# Patient Record
Sex: Female | Born: 1955
Health system: Southern US, Community
[De-identification: ages and names within clinical notes are randomized; demographics above are authoritative.]

## PROBLEM LIST (undated history)

## (undated) DIAGNOSIS — K219 Gastro-esophageal reflux disease without esophagitis: Secondary | ICD-10-CM

## (undated) DIAGNOSIS — F329 Major depressive disorder, single episode, unspecified: Secondary | ICD-10-CM

## (undated) DIAGNOSIS — C44509 Unspecified malignant neoplasm of skin of other part of trunk: Secondary | ICD-10-CM

## (undated) DIAGNOSIS — E785 Hyperlipidemia, unspecified: Secondary | ICD-10-CM

## (undated) DIAGNOSIS — F32A Depression, unspecified: Secondary | ICD-10-CM

## (undated) DIAGNOSIS — F419 Anxiety disorder, unspecified: Secondary | ICD-10-CM

## (undated) DIAGNOSIS — R7303 Prediabetes: Secondary | ICD-10-CM

## (undated) DIAGNOSIS — I10 Essential (primary) hypertension: Secondary | ICD-10-CM

## (undated) DIAGNOSIS — M1990 Unspecified osteoarthritis, unspecified site: Secondary | ICD-10-CM

## (undated) DIAGNOSIS — F319 Bipolar disorder, unspecified: Secondary | ICD-10-CM

## (undated) HISTORY — DX: Anxiety disorder, unspecified: F41.9

## (undated) HISTORY — DX: Major depressive disorder, single episode, unspecified: F32.9

## (undated) HISTORY — PX: MULTIPLE TOOTH EXTRACTIONS: SHX2053

## (undated) HISTORY — PX: TUBAL LIGATION: SHX77

## (undated) HISTORY — PX: CARPAL TUNNEL RELEASE: SHX101

## (undated) HISTORY — PX: OTHER SURGICAL HISTORY: SHX169

## (undated) HISTORY — PX: SKIN CANCER EXCISION: SHX779

## (undated) HISTORY — DX: Hyperlipidemia, unspecified: E78.5

## (undated) HISTORY — DX: Depression, unspecified: F32.A

## (undated) HISTORY — DX: Essential (primary) hypertension: I10

## (undated) HISTORY — PX: CERVICAL ABLATION: SHX5771

## (undated) HISTORY — PX: TRIGGER FINGER RELEASE: SHX641

## (undated) HISTORY — PX: CHOLECYSTECTOMY: SHX55

---

## 2003-09-07 ENCOUNTER — Other Ambulatory Visit: Admission: RE | Admit: 2003-09-07 | Discharge: 2003-09-07 | Payer: Self-pay | Admitting: Family Medicine

## 2004-12-02 ENCOUNTER — Other Ambulatory Visit: Admission: RE | Admit: 2004-12-02 | Discharge: 2004-12-02 | Payer: Self-pay | Admitting: Family Medicine

## 2006-02-01 ENCOUNTER — Other Ambulatory Visit: Admission: RE | Admit: 2006-02-01 | Discharge: 2006-02-01 | Payer: Self-pay | Admitting: Family Medicine

## 2007-12-22 ENCOUNTER — Ambulatory Visit (HOSPITAL_COMMUNITY): Admission: RE | Admit: 2007-12-22 | Discharge: 2007-12-23 | Payer: Self-pay | Admitting: Urology

## 2011-04-21 NOTE — Op Note (Signed)
NAMEGRACIE, Debbie Steele              ACCOUNT NO.:  [id number]   MEDICAL RECORD NO.:  [id number]          PATIENT TYPE:  OIB   LOCATION:  1413                         FACILITY:  [facility]   PHYSICIAN:  Excell Seltzer. Annabell Howells, M.D.    DATE OF BIRTH:  [birth year]   DATE OF PROCEDURE:  12/22/2007  DATE OF DISCHARGE:  12/23/2007                               OPERATIVE REPORT   PROCEDURE:  SPARC sling.   SURGEON:  Excell Seltzer. Annabell Howells, M.D.   ASSISTANT:  Martina Sinner, MD   ANESTHESIA:  General.   DRAIN:  Foley catheter.   COMPLICATIONS:  None.   INDICATIONS:  Ms. Archambeau is a 55 year old white female with stress  incontinence and a rectocele, who is to undergo a SPARC sling by me and  a rectocele repair by Dr. Sherron Monday.   FINDINGS AND PROCEDURE:  She was taken to the operating room and after  receiving Cipro, a general anesthetic was used.  She was fitted with PAS  hose and placed in lithotomy position.  Her mons and genitalia were  clipped.  She was then prepped with Betadine solution and draped in the  usual sterile fashion.  A Foley catheter was inserted and a weighted  vaginal retractor was placed.  The anterior vaginal wall at the  midurethral level was infiltrated with approximately 5 mL of 1%  lidocaine with epinephrine.  A midline vaginal wall incision was made  through the mucosa.  The mucosa was elevated off the pubourethral fascia  laterally for approximately 1-2 cm on each side, allowing room for the  tip of a finger.  Stab wounds were made over the pubis approximately 2  cm lateral to the midline on each side.  [facility] trocars were then  passed through the abdominal incision initially on the right, down to  the top of the pubis.  The trocar was walked along the back of the pubis  and then brought out under finger guidance into the vaginal incision.  This was repeated on the left.  Cystoscopy was then performed with a 22-  [place] scope and 70-degree lens.  No evidence of bladder  wall injury was  noted.  The [facility] mesh was then secured to the trocars and pulled back  up to the abdominal incision.  Once the mesh was in a good position, the  ends were trimmed and the sheath was removed.  Cystoscopy was then  repeated once again without evidence of bladder wall injury.  The  tension of the mesh was checked and was felt to be appropriate.  The  anterior vaginal wall was then closed using a running 2-0 Vicryl.  The  abdominal wall incisions were closed with Dermabond.  At this point Dr.  Sherron Monday performed the rectocele repair. A separate note will be  dictated.  The patient was left to Foley catheter drainage following my  procedure and a vaginal pack was placed at the end of the procedure.  There were no complications.      Excell Seltzer. Annabell Howells, M.D.  Electronically Signed     JJW/MEDQ  D:  12/23/2007  T:  12/24/2007  Job:  [id number]

## 2011-04-21 NOTE — Op Note (Signed)
Debbie, Steele              ACCOUNT NO.:  [id number]   MEDICAL RECORD NO.:  [id number]          PATIENT TYPE:  AMB   LOCATION:  DAY                          FACILITY:  [facility]   PHYSICIAN:  Debbie Sinner, MD DATE OF BIRTH:  [birth year]   DATE OF PROCEDURE:  12/22/2007  DATE OF DISCHARGE:                               OPERATIVE REPORT   PREOPERATIVE DIAGNOSIS:  1. Rectocele.  2. Stress urinary incontinence.   POSTOPERATIVE DIAGNOSIS:  1. Rectocele.  2. Stress urinary incontinence.   OPERATION:  1. Rectocele repair plus graft.  2. Sling cystourethropexy.   SURGEON:  For the rectocele repair plus graft:  Primary Surgeon:  Debbie Steele, M.D.  Assistant Surgeon  Debbie Steele. Debbie Steele, M.D.   For sling cystourethropexy and cystoscopy:  Primary Surgeon  Debbie Steele. Debbie Steele, M.D.  Assistant Surgeon  Debbie Steele, M.D.   Debbie Steele has symptomatic stress incontinence as well as a symptomatic  rectocele.  She consented to the above procedure.  The patient was  prepped and draped in the usual fashion.  Extra care was taken with leg  positioning to minimize the risk of  compartment syndrome, neuropathy,  and DVT.   Dr. Annabell Steele initially performed the sling cystourethropexy and I assisted  him.  This will be dictated by Dr. Annabell Steele.   Following the sling, we had directed the surgery to the posterior defect  which was very visible.  Two Allis clamps were replaced on the introitus  and I removed a small triangle of perineal skin.  Using Allis', I made a  long posterior vaginal wall incision after instilling 16 mL of 1%  epinephrine lidocaine mixture.  I sharply and bluntly dissected the  rectovaginal fascia from the posterior vaginal wall epithelium to  approximately 2 cm from the apex.  Her apex was very well supported.  There is no defect near the apex and based upon her posterior length, I  did not go further than this.  I dissected laterally to the lateral  sidewall and  gently started to break through the pararectal pillars to  place future sutures.   I did a digital rectal examination and she had no enterocele.  She had a  large grade 2 rectocele with generalized thinning of the rectovaginal  fascia.  There was no obvious site defect that would flatten the defect.  For this reason, I did a two layer imbricating closure, picking up at 5  and 7 o'clock.  I did this in two layers.  I was very pleased with the  flattening of the posterior defect and I did three rectal examinations  to make certain there was no distortion of the rectum, welling over of  the rectum, or suture or injury to the rectum.   I placed four 2-0 Vicryl sutures at the four corners, two through the  levator muscle near the pararectal pillar and two through rectovaginal  fascia near the introitus.  I then placed a 4 x 7 dermal graft, trimming  it minimally as it approached the introitus.  It sewed in very nicely.  I trimmed  approximately 2/3 cm of posterior vaginal wall bilaterally.  I  closed the posterior vaginal wall with running 2-0 Vicryl on a CT1  needle.  I did one 0 Vicryl suture in the perineal body incorporated the  distal aspect of the graft.  It was not under tension.  I closed the  perineal skin with subcuticular suture.  There was excellent support of  the posterior vaginal wall at the end the case.  There was no narrowing  of the vagina or shortening of the vagina.  Total blood loss posteriorly  was less than 100 mL.  A vaginal pack with Estrace cream was inserted  and firmly placed for hemostasis post procedure.  I was very pleased  with Debbie Steele surgery and hopefully this will reach her treatment  goal.           ______________________________  Debbie Sinner, MD  Electronically Signed     SAM/MEDQ  D:  12/22/2007  T:  12/22/2007  Job:  [id number]

## 2011-08-27 LAB — BASIC METABOLIC PANEL
CO2: 34 — ABNORMAL HIGH
Calcium: 9.3
GFR calc Af Amer: >60
GFR calc non Af Amer: >60
Sodium: 142

## 2011-08-27 LAB — HEMOGLOBIN AND HEMATOCRIT, BLOOD: HCT: 43.8

## 2012-11-14 ENCOUNTER — Encounter: Payer: Self-pay | Admitting: Internal Medicine

## 2012-12-26 ENCOUNTER — Encounter: Payer: Self-pay | Admitting: Internal Medicine

## 2013-02-14 ENCOUNTER — Other Ambulatory Visit: Payer: Self-pay | Admitting: *Deleted

## 2013-02-14 DIAGNOSIS — E559 Vitamin D deficiency, unspecified: Secondary | ICD-10-CM

## 2013-03-08 ENCOUNTER — Ambulatory Visit (INDEPENDENT_AMBULATORY_CARE_PROVIDER_SITE_OTHER): Payer: Medicare Other

## 2013-03-08 ENCOUNTER — Ambulatory Visit (INDEPENDENT_AMBULATORY_CARE_PROVIDER_SITE_OTHER): Payer: Medicare Other | Admitting: Pharmacist

## 2013-03-08 ENCOUNTER — Other Ambulatory Visit: Payer: Self-pay

## 2013-03-08 DIAGNOSIS — F319 Bipolar disorder, unspecified: Secondary | ICD-10-CM | POA: Insufficient documentation

## 2013-03-08 DIAGNOSIS — F411 Generalized anxiety disorder: Secondary | ICD-10-CM | POA: Insufficient documentation

## 2013-03-08 DIAGNOSIS — E785 Hyperlipidemia, unspecified: Secondary | ICD-10-CM | POA: Insufficient documentation

## 2013-03-08 DIAGNOSIS — Z13820 Encounter for screening for osteoporosis: Secondary | ICD-10-CM

## 2013-03-08 DIAGNOSIS — I10 Essential (primary) hypertension: Secondary | ICD-10-CM

## 2013-03-08 DIAGNOSIS — E559 Vitamin D deficiency, unspecified: Secondary | ICD-10-CM

## 2013-03-08 NOTE — Progress Notes (Signed)
Patient ID: EVALEEN SANT, female   DOB: 1956/10/11, 57 y.o.   MRN: [id number] [facility] Current Height:5'8"        Max Lifetime Height:  5'8" Current Weight:   282lbs      Ethnicity: non Hispanic white  HPI: Does pt already have a diagnosis of:  Osteopenia?  No Osteoporosis?  No  Back Pain?  Yes - bulging disc at L5      Kyphosis?  No Prior fracture?  No Med(s) for Osteoporosis/Osteopenia:  none Med(s) previously tried for Osteoporosis/Osteopenia:  none                                                             PMH: Age at menopause:  ?currently - possible last period was 2 months ago Hysterectomy?  No Oophorectomy?  No HRT? No Steroid Use?  No Thyroid med?  No History of cancer?  No History of digestive disorders (ie Crohn's)?  No Current or previous eating disorders?  No Last Vitamin D Result:  31 (11/2012) Last GFR Result:  60 (11/2012)   FH/SH: Family history of osteoporosis?  Yes - mother and sister Parent with history of hip fracture?  No Family history of breast cancer?  No Exercise?  No Caffeine?  No Smoking?  Yes - electronic cigs Alcohol?  No    Calcium Assessment Calcium Intake  # of servings/day  Calcium mg  Milk (8 oz) 1  x  300  = 300  Yogurt (8 oz) 0 x  400 = 0  Cheese (1 oz) 0 x  200 = 0  Non dairy sources   250 mg  Ca supplement none = 0   Estimated calcium intake per day 550mg     DEXA Results Date of Test T-Score for AP Spine L1-L4 T-Score for Total Left  Hip T-Score for Total Right  Hip  03/08/2013 0.1 0.8 0.9  10/09/2009 -0.4 0.8 0.5  03/29/2006 -0.2 0.3 0.5         Assessment: Normal BMD with minimal changes - pt is at increased risk of osteoporosis due to family history and high risk medications.  Recommendations: Start calcium supplement or add calcium rich foods to diet - goal daily intake of calcium is 1200mg /day Weight bearing exercise - suggested [employer] Educate on fall preventtion - counseling and educational  materials provided Recheck DEXA:  2 years     Time spent counseling patient:  30 minutes

## 2013-03-08 NOTE — Patient Instructions (Signed)
Fall Prevention and Home Safety Falls cause injuries and can affect all age groups. It is possible to use preventive measures to significantly decrease the likelihood of falls. There are many simple measures which can make your home safer and prevent falls. OUTDOORS  Repair cracks and edges of walkways and driveways.  Remove high doorway thresholds.  Trim shrubbery on the main path into your home.  Have good outside lighting.  Clear walkways of tools, rocks, debris, and clutter.  Check that handrails are not broken and are securely fastened. Both sides of steps should have handrails.  Have leaves, snow, and ice cleared regularly.  Use sand or salt on walkways during winter months.  In the garage, clean up grease or oil spills. BATHROOM  Install night lights.  Install grab bars by the toilet and in the tub and shower.  Use non-skid mats or decals in the tub or shower.  Place a plastic non-slip stool in the shower to sit on, if needed.  Keep floors dry and clean up all water on the floor immediately.  Remove soap buildup in the tub or shower on a regular basis.  Secure bath mats with non-slip, double-sided rug tape.  Remove throw rugs and tripping hazards from the floors. BEDROOMS  Install night lights.  Make sure a bedside light is easy to reach.  Do not use oversized bedding.  Keep a telephone by your bedside.  Have a firm chair with side arms to use for getting dressed.  Remove throw rugs and tripping hazards from the floor. KITCHEN  Keep handles on pots and pans turned toward the center of the stove. Use back burners when possible.  Clean up spills quickly and allow time for drying.  Avoid walking on wet floors.  Avoid hot utensils and knives.  Position shelves so they are not too high or low.  Place commonly used objects within easy reach.  If necessary, use a sturdy step stool with a grab bar when reaching.  Keep electrical cables out of the  way.  Do not use floor polish or wax that makes floors slippery. If you must use wax, use non-skid floor wax.  Remove throw rugs and tripping hazards from the floor. STAIRWAYS  Never leave objects on stairs.  Place handrails on both sides of stairways and use them. Fix any loose handrails. Make sure handrails on both sides of the stairways are as long as the stairs.  Check carpeting to make sure it is firmly attached along stairs. Make repairs to worn or loose carpet promptly.  Avoid placing throw rugs at the top or bottom of stairways, or properly secure the rug with carpet tape to prevent slippage. Get rid of throw rugs, if possible.  Have an electrician put in a light switch at the top and bottom of the stairs. OTHER FALL PREVENTION TIPS  Wear low-heel or rubber-soled shoes that are supportive and fit well. Wear closed toe shoes.  When using a stepladder, make sure it is fully opened and both spreaders are firmly locked. Do not climb a closed stepladder.  Add color or contrast paint or tape to grab bars and handrails in your home. Place contrasting color strips on first and last steps.  Learn and use mobility aids as needed. Install an electrical emergency response system.  Turn on lights to avoid dark areas. Replace light bulbs that burn out immediately. Get light switches that glow.  Arrange furniture to create clear pathways. Keep furniture in the same place.    Firmly attach carpet with non-skid or double-sided tape.  Eliminate uneven floor surfaces.  Select a carpet pattern that does not visually hide the edge of steps.  Be aware of all pets. OTHER HOME SAFETY TIPS  Set the water temperature for 120 F (48.8 C).  Keep emergency numbers on or near the telephone.  Keep smoke detectors on every level of the home and near sleeping areas. Document Released: 11/13/2002 Document Revised: 05/24/2012 Document Reviewed: 02/12/2012 [facility] Patient Information 2013  [place], [place].      Weight Bearing Exercise as tolerated 4 times per week  Yoga,  Walking  zumba

## 2013-05-01 ENCOUNTER — Other Ambulatory Visit: Payer: Self-pay | Admitting: Nurse Practitioner

## 2013-06-24 ENCOUNTER — Other Ambulatory Visit: Payer: Self-pay | Admitting: Nurse Practitioner

## 2013-06-27 NOTE — Telephone Encounter (Signed)
MMM PT. LAST OV 2/14

## 2013-07-06 ENCOUNTER — Other Ambulatory Visit: Payer: Self-pay | Admitting: Nurse Practitioner

## 2013-07-26 ENCOUNTER — Encounter: Payer: Self-pay | Admitting: General Practice

## 2013-07-26 ENCOUNTER — Ambulatory Visit (INDEPENDENT_AMBULATORY_CARE_PROVIDER_SITE_OTHER): Payer: Medicare Other | Admitting: General Practice

## 2013-07-26 ENCOUNTER — Telehealth: Payer: Self-pay | Admitting: Nurse Practitioner

## 2013-07-26 VITALS — BP 114/64 | HR 88 | Temp 98.1°F | Ht 68.0 in | Wt 277.0 lb

## 2013-07-26 DIAGNOSIS — M1990 Unspecified osteoarthritis, unspecified site: Secondary | ICD-10-CM

## 2013-07-26 DIAGNOSIS — M129 Arthropathy, unspecified: Secondary | ICD-10-CM

## 2013-07-26 DIAGNOSIS — M170 Bilateral primary osteoarthritis of knee: Secondary | ICD-10-CM

## 2013-07-26 DIAGNOSIS — M1710 Unilateral primary osteoarthritis, unspecified knee: Secondary | ICD-10-CM

## 2013-07-26 MED ORDER — MELOXICAM 7.5 MG PO TABS: 7.5000 mg | ORAL_TABLET | Freq: Every day | ORAL | Status: DC

## 2013-07-26 NOTE — Progress Notes (Signed)
  Subjective:    Patient ID: Debbie Steele, female    DOB: 28-Sep-1956, 57 y.o.   MRN: [id number]  HPI Patient presents today with complaints of bilateral knee pain. Pain in the left is greater than right. She reports pain as 10 on 1-10 scale. She denies known injury and reports onset was over 1 year ago. She reports taking aleve and bc arthritis, OTC with minimal relief. Denies use of ice or heat packs to affected areas. Denies regular exercise.     Review of Systems  Constitutional: Negative for fever and chills.  Respiratory: Negative for chest tightness and shortness of breath.   Cardiovascular: Negative for chest pain and palpitations.  Musculoskeletal:       Bilateral knee pain  Neurological: Negative for dizziness, weakness and headaches.       Objective:   Physical Exam  Constitutional: She is oriented to person, place, and time. She appears well-developed and well-nourished.  Cardiovascular: Normal rate, regular rhythm and normal heart sounds.   Pulmonary/Chest: Effort normal and breath sounds normal. No respiratory distress. She exhibits no tenderness.  Musculoskeletal: She exhibits no edema and no tenderness.  Pain and discomfort with flexion of knees with body weight applied.   Neurological: She is alert and oriented to person, place, and time.  Skin: Skin is warm and dry.  Psychiatric: She has a normal mood and affect.   [facility] reading (PRIMARY) by Coralie Keens, FNP-C, no fracture or dislocation.                                         Assessment & Plan:  1. Arthritis  2. Osteoarthritis of both knees - meloxicam (MOBIC) 7.5 MG tablet; Take 1 tablet (7.5 mg total) by mouth daily.  Dispense: 30 tablet; Refill: 0 -RICE instructions provided and discussed -RTO if symptoms worsen -Patient verbalized understanding -Coralie Keens, FNP-C

## 2013-07-26 NOTE — Telephone Encounter (Signed)
Knee pain. Appt scheduled.  Patient aware.

## 2013-07-26 NOTE — Patient Instructions (Addendum)
Knee Pain  The knee is the complex joint between your thigh and your lower leg. It is made up of bones, tendons, ligaments, and cartilage. The bones that make up the knee are:   The femur in the thigh.   The tibia and fibula in the lower leg.   The patella or kneecap riding in the groove on the lower femur.  CAUSES   Knee pain is a common complaint with many causes. A few of these causes are:   Injury, such as:   A ruptured ligament or tendon injury.   Torn cartilage.   Medical conditions, such as:   Gout   Arthritis   Infections   Overuse, over training or overdoing a physical activity.  Knee pain can be minor or severe. Knee pain can accompany debilitating injury. Minor knee problems often respond well to self-care measures or get well on their own. More serious injuries may need medical intervention or even surgery.  SYMPTOMS  The knee is complex. Symptoms of knee problems can vary widely. Some of the problems are:   Pain with movement and weight bearing.   Swelling and tenderness.   Buckling of the knee.   Inability to straighten or extend your knee.   Your knee locks and you cannot straighten it.   Warmth and redness with pain and fever.   Deformity or dislocation of the kneecap.  DIAGNOSIS   Determining what is wrong may be very straight forward such as when there is an injury. It can also be challenging because of the complexity of the knee. Tests to make a diagnosis may include:   Your caregiver taking a history and doing a physical exam.   Routine X-rays can be used to rule out other problems. X-rays will not reveal a cartilage tear. Some injuries of the knee can be diagnosed by:   Arthroscopy a surgical technique by which a small video camera is inserted through tiny incisions on the sides of the knee. This procedure is used to examine and repair internal knee joint problems. Tiny instruments can be used during arthroscopy to repair the torn knee cartilage (meniscus).   Arthrography  is a radiology technique. A contrast liquid is directly injected into the knee joint. Internal structures of the knee joint then become visible on X-ray film.   An MRI scan is a non x-ray radiology procedure in which magnetic fields and a computer produce two- or three-dimensional images of the inside of the knee. Cartilage tears are often visible using an MRI scanner. MRI scans have largely replaced arthrography in diagnosing cartilage tears of the knee.   Blood work.   Examination of the fluid that helps to lubricate the knee joint (synovial fluid). This is done by taking a sample out using a needle and a syringe.  TREATMENT  The treatment of knee problems depends on the cause. Some of these treatments are:   Depending on the injury, proper casting, splinting, surgery or physical therapy care will be needed.   Give yourself adequate recovery time. Do not overuse your joints. If you begin to get sore during workout routines, back off. Slow down or do fewer repetitions.   For repetitive activities such as cycling or running, maintain your strength and nutrition.   Alternate muscle groups. For example if you are a weight lifter, work the upper body on one day and the lower body the next.   Either tight or weak muscles do not give the proper support for your   knee. Tight or weak muscles do not absorb the stress placed on the knee joint. Keep the muscles surrounding the knee strong.   Take care of mechanical problems.   If you have flat feet, orthotics or special shoes may help. See your caregiver if you need help.   Arch supports, sometimes with wedges on the inner or outer aspect of the heel, can help. These can shift pressure away from the side of the knee most bothered by osteoarthritis.   A brace called an "unloader" brace also may be used to help ease the pressure on the most arthritic side of the knee.   If your caregiver has prescribed crutches, braces, wraps or ice, use as directed. The acronym for  this is PRICE. This means protection, rest, ice, compression and elevation.   Nonsteroidal anti-inflammatory drugs (NSAID's), can help relieve pain. But if taken immediately after an injury, they may actually increase swelling. Take NSAID's with food in your stomach. Stop them if you develop stomach problems. Do not take these if you have a history of ulcers, stomach pain or bleeding from the bowel. Do not take without your caregiver's approval if you have problems with fluid retention, heart failure, or kidney problems.   For ongoing knee problems, physical therapy may be helpful.   Glucosamine and chondroitin are over-the-counter dietary supplements. Both may help relieve the pain of osteoarthritis in the knee. These medicines are different from the usual anti-inflammatory drugs. Glucosamine may decrease the rate of cartilage destruction.   Injections of a corticosteroid drug into your knee joint may help reduce the symptoms of an arthritis flare-up. They may provide pain relief that lasts a few months. You may have to wait a few months between injections. The injections do have a small increased risk of infection, water retention and elevated blood sugar levels.   Hyaluronic acid injected into damaged joints may ease pain and provide lubrication. These injections may work by reducing inflammation. A series of shots may give relief for as long as 6 months.   Topical painkillers. Applying certain ointments to your skin may help relieve the pain and stiffness of osteoarthritis. Ask your pharmacist for suggestions. Many over the-counter products are approved for temporary relief of arthritis pain.   In some countries, doctors often prescribe topical NSAID's for relief of chronic conditions such as arthritis and tendinitis. A review of treatment with NSAID creams found that they worked as well as oral medications but without the serious side effects.  PREVENTION   Maintain a healthy weight. Extra pounds put  more strain on your joints.   Get strong, stay limber. Weak muscles are a common cause of knee injuries. Stretching is important. Include flexibility exercises in your workouts.   Be smart about exercise. If you have osteoarthritis, chronic knee pain or recurring injuries, you may need to change the way you exercise. This does not mean you have to stop being active. If your knees ache after jogging or playing basketball, consider switching to swimming, water aerobics or other low-impact activities, at least for a few days a week. Sometimes limiting high-impact activities will provide relief.   Make sure your shoes fit well. Choose footwear that is right for your sport.   Protect your knees. Use the proper gear for knee-sensitive activities. Use kneepads when playing volleyball or laying carpet. Buckle your seat belt every time you drive. Most shattered kneecaps occur in car accidents.   Rest when you are tired.  SEEK MEDICAL CARE IF:     You have knee pain that is continual and does not seem to be getting better.   SEEK IMMEDIATE MEDICAL CARE IF:   Your knee joint feels hot to the touch and you have a high fever.  MAKE SURE YOU:    Understand these instructions.   Will watch your condition.   Will get help right away if you are not doing well or get worse.  Document Released: 09/20/2007 Document Revised: 02/15/2012 Document Reviewed: 09/20/2007  [employer] Patient Information 2014 [employer].

## 2013-07-27 ENCOUNTER — Other Ambulatory Visit (INDEPENDENT_AMBULATORY_CARE_PROVIDER_SITE_OTHER): Payer: Medicare Other

## 2013-07-27 DIAGNOSIS — E785 Hyperlipidemia, unspecified: Secondary | ICD-10-CM

## 2013-07-28 LAB — CMP14+EGFR
ALT: 17 IU/L (ref 0–32)
CO2: 27 mmol/L (ref 18–29)
Calcium: 9.3 mg/dL (ref 8.7–10.2)
Chloride: 102 mmol/L (ref 97–108)
Glucose: 105 mg/dL — ABNORMAL HIGH (ref 65–99)
Potassium: 4.8 mmol/L (ref 3.5–5.2)
Total Protein: 6.2 g/dL (ref 6.0–8.5)

## 2013-07-28 LAB — NMR, LIPOPROFILE
HDL Cholesterol by NMR: 52 mg/dL (ref >=40)
LDL Particle Number: 1541 nmol/L — ABNORMAL HIGH (ref <1000)
LDL Size: 21.2 nm (ref >20.5)
Triglycerides by NMR: 102 mg/dL (ref <150)

## 2013-08-01 ENCOUNTER — Other Ambulatory Visit: Payer: Self-pay | Admitting: Nurse Practitioner

## 2013-08-01 MED ORDER — SIMVASTATIN 40 MG PO TABS: 40.0000 mg | ORAL_TABLET | Freq: Every day | ORAL | Status: DC

## 2013-08-03 ENCOUNTER — Other Ambulatory Visit: Payer: Self-pay | Admitting: Family Medicine

## 2013-08-05 ENCOUNTER — Other Ambulatory Visit: Payer: Self-pay | Admitting: Family Medicine

## 2013-08-09 ENCOUNTER — Other Ambulatory Visit: Payer: Self-pay | Admitting: Family Medicine

## 2013-08-21 ENCOUNTER — Ambulatory Visit (INDEPENDENT_AMBULATORY_CARE_PROVIDER_SITE_OTHER): Payer: Medicare Other

## 2013-08-21 ENCOUNTER — Ambulatory Visit (INDEPENDENT_AMBULATORY_CARE_PROVIDER_SITE_OTHER): Payer: Medicare Other | Admitting: Family Medicine

## 2013-08-21 ENCOUNTER — Encounter: Payer: Self-pay | Admitting: Family Medicine

## 2013-08-21 VITALS — BP 119/69 | HR 86 | Temp 97.2°F | Ht 67.0 in | Wt 276.8 lb

## 2013-08-21 DIAGNOSIS — M25562 Pain in left knee: Secondary | ICD-10-CM

## 2013-08-21 DIAGNOSIS — F319 Bipolar disorder, unspecified: Secondary | ICD-10-CM

## 2013-08-21 DIAGNOSIS — E669 Obesity, unspecified: Secondary | ICD-10-CM

## 2013-08-21 DIAGNOSIS — IMO0002 Reserved for concepts with insufficient information to code with codable children: Secondary | ICD-10-CM

## 2013-08-21 DIAGNOSIS — M25569 Pain in unspecified knee: Secondary | ICD-10-CM

## 2013-08-21 DIAGNOSIS — E785 Hyperlipidemia, unspecified: Secondary | ICD-10-CM

## 2013-08-21 DIAGNOSIS — M1710 Unilateral primary osteoarthritis, unspecified knee: Secondary | ICD-10-CM

## 2013-08-21 DIAGNOSIS — M170 Bilateral primary osteoarthritis of knee: Secondary | ICD-10-CM

## 2013-08-21 DIAGNOSIS — I10 Essential (primary) hypertension: Secondary | ICD-10-CM

## 2013-08-21 DIAGNOSIS — F411 Generalized anxiety disorder: Secondary | ICD-10-CM

## 2013-08-21 MED ORDER — MELOXICAM 15 MG PO TABS: 15.0000 mg | ORAL_TABLET | Freq: Every day | ORAL | Status: DC

## 2013-08-21 NOTE — Progress Notes (Signed)
Patient ID: Debbie Steele, female   DOB: 01/17/1956, 57 y.o.   MRN: [id number] SUBJECTIVE: CC: Chief Complaint  Patient presents with  . Acute Visit    left knee  pain saw MAE but no better    HPI:  Has been having increased pain in the left knee. Sometimes it is swelled up. No fever. No heat out of it. No history of gout.  Injury: was on a new lawn mower and it turned over in the past. But it was not hurting like this. Doesn't lock, but hurts and pops a lot especially when she gets out of a car. Popping makes it feel better.  Breakfast: doesn't eat breakfast Lunch: macaroni and cheese and soup Supper: same, sometimes husband brings home pizza. Sodas: diet caffeine free  Stay home wife Watches TV all the time.  Past Medical History  Diagnosis Date  . Anxiety   . Depression   . Hypertension   . Hyperlipidemia    Past Surgical History  Procedure Laterality Date  . Tubal ligation    . Cholecystectomy     History   Social History  . Marital Status: Married    Spouse Name: N/A    Number of Children: N/A  . Years of Education: N/A   Occupational History  . Not on file.   Social History Main Topics  . Smoking status: Current Every Day Smoker  . Smokeless tobacco: Current User     Comment: E-Cigs  . Alcohol Use: No  . Drug Use: No  . Sexual Activity: Not on file   Other Topics Concern  . Not on file   Social History Narrative  . No narrative on file   Family History  Problem Relation Age of Onset  . Diabetes Mother   . Glaucoma Mother   . Hypertension Father   . Anuerysm Father    Current Outpatient Prescriptions on File Prior to Visit  Medication Sig Dispense Refill  . ALPRAZolam (XANAX XR) 0.5 MG 24 hr tablet Take 0.5 mg by mouth 2 (two) times daily as needed. PA @ [facility]  Dr Lorrene Reid      . ARIPiprazole (ABILIFY) 5 MG tablet Take 5 mg by mouth daily.      . Choline Fenofibrate (FENOFIBRIC ACID) 135 MG CPDR TAKE 1 CAPSULE EVERY DAY   30 capsule  4  . furosemide (LASIX) 40 MG tablet Take 40 mg by mouth every morning.      . lamoTRIgine (LAMICTAL) 200 MG tablet Take 200 mg by mouth daily.      Marland Kitchen lisinopril (PRINIVIL,ZESTRIL) 20 MG tablet TAKE 1 TABLET BY MOUTH EVERY DAY  30 tablet  4  . meloxicam (MOBIC) 7.5 MG tablet Take 1 tablet (7.5 mg total) by mouth daily.  30 tablet  0  . QUEtiapine (SEROQUEL) 300 MG tablet Take 300 mg by mouth at bedtime.      . simvastatin (ZOCOR) 40 MG tablet Take 1 tablet (40 mg total) by mouth at bedtime.  90 tablet  1   No current facility-administered medications on file prior to visit.   Allergies  Allergen Reactions  . Depakene [Valproate Sodium] Itching and Rash    There is no immunization history on file for this patient. Prior to Admission medications   Medication Sig Start Date End Date Taking? Authorizing Provider  ALPRAZolam (XANAX XR) 0.5 MG 24 hr tablet Take 0.5 mg by mouth 2 (two) times daily as needed. PA @ [facility]  Dr Lorrene Reid   Yes Historical Provider, MD  ARIPiprazole (ABILIFY) 5 MG tablet Take 5 mg by mouth daily.   Yes Historical Provider, MD  chlordiazePOXIDE (LIBRIUM) 25 MG capsule Take 25 mg by mouth 2 (two) times daily.   Yes Historical Provider, MD  Choline Fenofibrate (FENOFIBRIC ACID) 135 MG CPDR TAKE 1 CAPSULE EVERY DAY 08/09/13  Yes Ernestina Penna, MD  furosemide (LASIX) 40 MG tablet Take 40 mg by mouth every morning.   Yes Historical Provider, MD  lamoTRIgine (LAMICTAL) 200 MG tablet Take 200 mg by mouth daily.   Yes Historical Provider, MD  lisinopril (PRINIVIL,ZESTRIL) 20 MG tablet TAKE 1 TABLET BY MOUTH EVERY DAY 08/03/13  Yes Mae Shelda Altes, FNP  meloxicam (MOBIC) 7.5 MG tablet Take 1 tablet (7.5 mg total) by mouth daily. 07/26/13  Yes Mae Shelda Altes, FNP  QUEtiapine (SEROQUEL) 300 MG tablet Take 300 mg by mouth at bedtime.   Yes Historical Provider, MD  sertraline (ZOLOFT) 100 MG tablet Take 100 mg by mouth daily.   Yes Historical Provider, MD   simvastatin (ZOCOR) 40 MG tablet Take 1 tablet (40 mg total) by mouth at bedtime. 08/01/13  Yes Mary-Margaret Daphine Deutscher, FNP     ROS: As above in the HPI. All other systems are stable or negative.  OBJECTIVE: APPEARANCE:  Patient in no acute distress.The patient appeared well nourished and normally developed. Acyanotic. Waist: VITAL SIGNS:BP 119/69  Pulse 86  Temp(Src) 97.2 F (36.2 C) (Oral)  Ht 5\' 7"  (1.702 m)  Wt 276 lb 12.8 oz (125.556 kg)  BMI 43.34 kg/m2  LMP 08/21/2013  WF obese SKIN: warm and  Dry without overt rashes, tattoos and scars  HEAD and Neck: without JVD, Head and scalp: normal Eyes:No scleral icterus. Fundi normal, eye movements normal. Ears: Auricle normal, canal normal, Tympanic membranes normal, insufflation normal. Nose: normal Throat: normal Neck & thyroid: normal  CHEST & LUNGS: Chest wall: normal Lungs: Clear  CVS: Reveals the PMI to be normally located. Regular rhythm, First and Second Heart sounds are normal,  absence of murmurs, rubs or gallops. Peripheral vasculature: Radial pulses: normal Dorsal pedis pulses: normal Posterior pulses: normal  ABDOMEN:  Appearance: very obese Benign, no organomegaly, no masses, no Abdominal Aortic enlargement. No Guarding , no rebound. No Bruits. Bowel sounds: normal  RECTAL: N/A GU: N/A  EXTREMETIES: nonedematous.  MUSCULOSKELETAL:  Spine: normal Joints: left knee puffy with most pain on extension and the grind test is positive. Painful to bear weight. Patient walks with tiptoe.   NEUROLOGIC: oriented to time,place and person; nonfocal. Strength is normal Sensory is normal Reflexes are normal Cranial Nerves are normal.  ASSESSMENT: Pain in left knee - Plan: DG Knee 1-2 Views Left, meloxicam (MOBIC) 15 MG tablet, MR Knee Left  Wo Contrast, Arthritis Panel  Osteoarthritis of both knees - Plan: meloxicam (MOBIC) 15 MG tablet, MR Knee Left  Wo Contrast, Arthritis Panel  Anxiety state,  unspecified  Bipolar disorder, unspecified  Essential hypertension, benign  Other and unspecified hyperlipidemia  Obesity   Suspect meniscus tear  PLAN: Orders Placed This Encounter  Procedures  . DG Knee 1-2 Views Left    Standing Status: Future     Number of Occurrences: 1     Standing Expiration Date: 10/21/2014    Order Specific Question:  Reason for Exam (SYMPTOM  OR DIAGNOSIS REQUIRED)    Answer:  pain    Order Specific Question:  Is the patient pregnant?    Answer:  No  Order Specific Question:  Preferred imaging location?    Answer:  Internal  . MR Knee Left  Wo Contrast    Standing Status: Future     Number of Occurrences:      Standing Expiration Date: 10/21/2014    Order Specific Question:  Reason for Exam (SYMPTOM  OR DIAGNOSIS REQUIRED)    Answer:  left knee pain with popping, and painful to bear weight after an injury when she fell off the lawn mower r/o meniscus tear    Order Specific Question:  Is the patient pregnant?    Answer:  No    Order Specific Question:  Preferred imaging location?    Answer:  [facility]    Order Specific Question:  Does the patient have a pacemaker, internal devices, implants, aneury    Answer:  No  . Arthritis Panel    [facility] reading (PRIMARY) by  Dr. Modesto Charon: no acute findings.  Medications Discontinued During This Encounter  Medication Reason  . buPROPion (WELLBUTRIN XL) 150 MG 24 hr tablet Discontinued by provider  . PARoxetine (PAXIL) 40 MG tablet Change in therapy  . meloxicam (MOBIC) 7.5 MG tablet Reorder    Meds ordered this encounter  Medications  . sertraline (ZOLOFT) 100 MG tablet    Sig: Take 100 mg by mouth daily.  . chlordiazePOXIDE (LIBRIUM) 25 MG capsule    Sig: Take 25 mg by mouth 2 (two) times daily.  . meloxicam (MOBIC) 15 MG tablet    Sig: Take 1 tablet (15 mg total) by mouth daily.    Dispense:  30 tablet    Refill:  0    Order Specific Question:  Supervising Provider    Answer:  Ernestina Penna [[id number]]    records in EPIC reviewed  Weight loss and  Diet recommended. Needs significant lifestyle changes , discussed       Dr Woodroe Mode Recommendations  For nutrition information, I recommend books:  1).Eat to Live by Dr Monico Hoar. 2).Prevent and Reverse Heart Disease by Dr Suzzette Righter. 3) Dr Katherina Right Book:  Program to Reverse Diabetes  Exercise recommendations are:  If unable to walk, then the patient can exercise in a chair 3 times a day. By flapping arms like a bird gently and raising legs outwards to the front.  If ambulatory, the patient can go for walks for 30 minutes 3 times a week. Then increase the intensity and duration as tolerated.  Goal is to try to attain exercise frequency to 5 times a week.  If applicable: Best to perform resistance exercises (machines or weights) 2 days a week and cardio type exercises 3 days per week.                   Follow up pending MRI  Bahar Shelden P. Modesto Charon, M.D.

## 2013-08-21 NOTE — Patient Instructions (Addendum)
      Dr Monae Topping's Recommendations  For nutrition information, I recommend books:  1).Eat to Live by Dr Joel Fuhrman. 2).Prevent and Reverse Heart Disease by Dr Caldwell Esselstyn. 3) Dr Neal Barnard's Book:  Program to Reverse Diabetes  Exercise recommendations are:  If unable to walk, then the patient can exercise in a chair 3 times a day. By flapping arms like a bird gently and raising legs outwards to the front.  If ambulatory, the patient can go for walks for 30 minutes 3 times a week. Then increase the intensity and duration as tolerated.  Goal is to try to attain exercise frequency to 5 times a week.  If applicable: Best to perform resistance exercises (machines or weights) 2 days a week and cardio type exercises 3 days per week.  

## 2013-08-22 LAB — ARTHRITIS PANEL
Anti Nuclear Antibody(ANA): NEGATIVE
Rhuematoid fact SerPl-aCnc: 10.1 IU/mL (ref 0.0–13.9)
Sed Rate: 3 mm/hr (ref 0–40)
Uric Acid: 3.2 mg/dL (ref 2.5–7.1)

## 2013-08-22 NOTE — Progress Notes (Signed)
Quick Note:  Call patient. Labs normal. No change in plan. ______ 

## 2013-08-24 ENCOUNTER — Telehealth: Payer: Self-pay | Admitting: Family Medicine

## 2013-08-28 ENCOUNTER — Ambulatory Visit (INDEPENDENT_AMBULATORY_CARE_PROVIDER_SITE_OTHER): Payer: Medicare Other | Admitting: Nurse Practitioner

## 2013-08-28 ENCOUNTER — Encounter: Payer: Self-pay | Admitting: Nurse Practitioner

## 2013-08-28 VITALS — BP 130/68 | HR 91 | Temp 97.7°F | Ht 67.0 in | Wt 379.0 lb

## 2013-08-28 DIAGNOSIS — M25562 Pain in left knee: Secondary | ICD-10-CM

## 2013-08-28 DIAGNOSIS — M25569 Pain in unspecified knee: Secondary | ICD-10-CM

## 2013-08-28 MED ORDER — BUPIVACAINE HCL 0.25 % IJ SOLN
1.0000 mL | Freq: Once | INTRAMUSCULAR | Status: AC
  Administered 2013-08-28: 1 mL via INTRA_ARTICULAR

## 2013-08-28 MED ORDER — METHYLPREDNISOLONE ACETATE 40 MG/ML IJ SUSP
40.0000 mg | Freq: Once | INTRAMUSCULAR | Status: AC
  Administered 2013-08-28: 40 mg via INTRA_ARTICULAR

## 2013-08-28 NOTE — Patient Instructions (Signed)

## 2013-08-28 NOTE — Progress Notes (Signed)
  Subjective:    Patient ID: Debbie Steele, female    DOB: 1956/09/27, 57 y.o.   MRN: [id number]  Knee Pain     Patient in today to discuss left knee pain- Has seen M.Haliburtin and Dr. Modesto Charon- all test have been negative. Knne pain is increasing- swelling intermitently    Review of Systems  All other systems reviewed and are negative.       Objective:   Physical Exam  Constitutional: She appears well-developed and well-nourished.  Cardiovascular: Normal rate, regular rhythm and normal heart sounds.   Pulmonary/Chest: Effort normal and breath sounds normal.  Neurological:  Mild left knee effusion with popping on exr\tension and flexion- All ligaments intact No patella tenderness   Procedure: betadine prep left knee Marcaine 0.25 1ml with Depomedrol 40mg - 1ml injected with 20g needle left knee joint Patient tolerated well          Assessment & Plan:   1. Left knee pain    Left knee joint injection Ice BID If hurts don't do it Elevate when sitting Follo wup prn  Debbie Daphine Deutscher, FNP

## 2013-09-04 ENCOUNTER — Ambulatory Visit (INDEPENDENT_AMBULATORY_CARE_PROVIDER_SITE_OTHER): Payer: Medicare Other | Admitting: Nurse Practitioner

## 2013-09-04 ENCOUNTER — Ambulatory Visit: Payer: Medicare Other | Admitting: Nurse Practitioner

## 2013-09-04 ENCOUNTER — Telehealth: Payer: Self-pay | Admitting: Nurse Practitioner

## 2013-09-04 ENCOUNTER — Encounter: Payer: Self-pay | Admitting: Nurse Practitioner

## 2013-09-04 VITALS — BP 120/67 | HR 84 | Temp 97.9°F | Ht 67.0 in | Wt 277.0 lb

## 2013-09-04 DIAGNOSIS — W57XXXA Bitten or stung by nonvenomous insect and other nonvenomous arthropods, initial encounter: Secondary | ICD-10-CM

## 2013-09-04 DIAGNOSIS — T148 Other injury of unspecified body region: Secondary | ICD-10-CM

## 2013-09-04 DIAGNOSIS — S90569A Insect bite (nonvenomous), unspecified ankle, initial encounter: Secondary | ICD-10-CM

## 2013-09-04 DIAGNOSIS — S70361A Insect bite (nonvenomous), right thigh, initial encounter: Secondary | ICD-10-CM

## 2013-09-04 MED ORDER — DOXYCYCLINE HYCLATE 100 MG PO TABS: 100.0000 mg | ORAL_TABLET | Freq: Two times a day (BID) | ORAL | Status: DC

## 2013-09-04 NOTE — Progress Notes (Signed)
  Subjective:    Patient ID: Debbie Steele, female    DOB: 1956-04-09, 57 y.o.   MRN: [id number]  HPI patient got a tock off of right groin area- not sure how long it had been there- Red area where it was.    Review of Systems  Constitutional: Negative for fever, chills, appetite change and fatigue.  HENT: Negative.   Respiratory: Negative.   Cardiovascular: Negative.        Objective:   Physical Exam  Constitutional: She appears well-developed and well-nourished.  Cardiovascular: Normal rate, regular rhythm, normal heart sounds and intact distal pulses.   Pulmonary/Chest: Effort normal and breath sounds normal.  Skin:  3cm erythematous area right upper thigh     BP 120/67  Pulse 84  Temp(Src) 97.9 F (36.6 C) (Oral)  Ht 5\' 7"  (1.702 m)  Wt 277 lb (125.646 kg)  BMI 43.37 kg/m2  LMP 08/21/2013      Assessment & Plan:  1. Tick bite of right thigh with local reaction Meds ordered this encounter  Medications  . doxycycline (VIBRA-TABS) 100 MG tablet    Sig: Take 1 tablet (100 mg total) by mouth 2 (two) times daily.    Dispense:  20 tablet    Refill:  0    Order Specific Question:  Supervising Provider    Answer:  Deborra Medina   Do not pick at area Return to office prn  Mary-Margaret Daphine Deutscher, FNP

## 2013-09-04 NOTE — Patient Instructions (Signed)
Deer Tick Bite Deer ticks are brown arachnids (spider family) that vary in size from as small as the head of a pin to 1/4 inch (1/2 cm) diameter. They thrive in wooded areas. Deer are the preferred host of adult deer ticks. Small rodents are the host of young ticks (nymphs). When a person walks in a field or wooded area, young and adult ticks in the surrounding grass and vegetation can attach themselves to the skin. They can suck blood for hours to days if unnoticed. Ticks are found all over the [place] Some ticks carry a specific bacteria (Borrelia burgdorferi) that causes an infection called Lyme disease. The bacteria is typically passed into a person during the blood sucking process. This happens after the tick has been attached for at least a number of hours. While ticks can be found all over the [place] those carrying the bacteria that causes Lyme disease are most common in [place] and the [place]. Only a small proportion of ticks in these areas carry the Lyme disease bacteria and cause human infections. Ticks usually attach to warm spots on the body, such as the:  Head.  Back.  Neck.  Armpits.  Groin. SYMPTOMS  Most of the time, a deer tick bite will not be felt. You may or may not see the attached tick. You may notice mild irritation or redness around the bite site. If the deer tick passes the Lyme disease bacteria to a person, a round, red rash may be noticed 2 to 3 days after the bite. The rash may be clear in the middle, like a bull's-eye or target. If not treated, other symptoms may develop several days to weeks after the onset of the rash. These symptoms may include:  New rash lesions.  Fatigue and weakness.  General ill feeling and achiness.  Chills.  Headache and neck pain.  Swollen lymph glands.  Sore muscles and joints. 5 to 15% of untreated people with Lyme disease may develop more severe illnesses after several weeks to months. This may include inflammation of the  brain lining (meningitis), nerve palsies, an abnormal heartbeat, or severe muscle and joint pain and inflammation (myositis or arthritis). DIAGNOSIS   Physical exam and medical history.  Viewing the tick if it was saved for confirmation.  Blood tests (to check or confirm the presence of Lyme disease). TREATMENT  Most ticks do not carry disease. If found, an attached tick should be removed using tweezers. Tweezers should be placed under the body of the tick so it is removed by its attachment parts (pincers). If there are signs or symptoms of being sick, or Lyme disease is confirmed, medicines (antibiotics) that kill germs are usually prescribed. In more severe cases, antibiotics may be given through an intravenous (IV) access. HOME CARE INSTRUCTIONS   Always remove ticks with tweezers. Do not use petroleum jelly or other methods to kill or remove the tick. Slide the tweezers under the body and pull out as much as you can. If you are not sure what it is, save it in a jar and show your caregiver.  Once you remove the tick, the skin will heal on its own. Wash your hands and the affected area with water and soap. You may place a bandage on the affected area.  Take medicine as directed. You may be advised to take a full course of antibiotics.  Follow up with your caregiver as recommended. FINDING OUT THE RESULTS OF YOUR TEST Not all test results are available   during your visit. If your test results are not back during the visit, make an appointment with your caregiver to find out the results. Do not assume everything is normal if you have not heard from your caregiver or the medical facility. It is important for you to follow up on all of your test results. PROGNOSIS  If Lyme disease is confirmed, early treatment with antibiotics is very effective. Following preventive guidelines is important since it is possible to get the disease more than once. PREVENTION   Wear long sleeves and long pants in  wooded or grassy areas. Tuck your pants into your socks.  Use an insect repellent while hiking.  Check yourself, your children, and your pets regularly for ticks after playing outside.  Clear piles of leaves or brush from your yard. Ticks might live there. SEEK MEDICAL CARE IF:   You or your child has an oral temperature above 102 F (38.9 C).  You develop a severe headache following the bite.  You feel generally ill.  You notice a rash.  You are having trouble removing the tick.  The bite area has red skin or yellow drainage. SEEK IMMEDIATE MEDICAL CARE IF:   Your face is weak and droopy or you have other neurological symptoms.  You have severe joint pain or weakness. MAKE SURE YOU:   Understand these instructions.  Will watch your condition.  Will get help right away if you are not doing well or get worse. FOR MORE INFORMATION Centers for Disease Control and Prevention: www.cdc.gov American Academy of [employer]icians: www.aafp.org Document Released: 02/17/2010 Document Revised: 02/15/2012 Document Reviewed: 02/17/2010 [employer] Patient Information 2014 [employer].  

## 2013-09-04 NOTE — Telephone Encounter (Signed)
Tick bite yesterday with redness appt scheduled with MMM

## 2013-09-13 ENCOUNTER — Ambulatory Visit (HOSPITAL_COMMUNITY): Payer: Medicare Other

## 2013-09-21 ENCOUNTER — Other Ambulatory Visit: Payer: Self-pay | Admitting: Nurse Practitioner

## 2013-09-25 ENCOUNTER — Ambulatory Visit: Payer: Medicare Other | Admitting: Nurse Practitioner

## 2013-10-25 ENCOUNTER — Other Ambulatory Visit: Payer: Self-pay | Admitting: Nurse Practitioner

## 2013-12-07 ENCOUNTER — Other Ambulatory Visit: Payer: Self-pay | Admitting: Nurse Practitioner

## 2013-12-26 ENCOUNTER — Other Ambulatory Visit: Payer: Self-pay

## 2013-12-26 MED ORDER — LISINOPRIL 20 MG PO TABS: 20.0000 mg | ORAL_TABLET | Freq: Every day | ORAL | Status: DC

## 2014-01-18 ENCOUNTER — Other Ambulatory Visit: Payer: Self-pay | Admitting: Nurse Practitioner

## 2014-01-30 ENCOUNTER — Other Ambulatory Visit: Payer: Self-pay | Admitting: Nurse Practitioner

## 2014-01-31 ENCOUNTER — Other Ambulatory Visit: Payer: Self-pay | Admitting: *Deleted

## 2014-01-31 MED ORDER — FENOFIBRIC ACID 135 MG PO CPDR: 135.0000 mg | DELAYED_RELEASE_CAPSULE | Freq: Every day | ORAL | Status: DC

## 2014-02-23 ENCOUNTER — Encounter: Payer: Self-pay | Admitting: Nurse Practitioner

## 2014-02-23 ENCOUNTER — Ambulatory Visit (INDEPENDENT_AMBULATORY_CARE_PROVIDER_SITE_OTHER): Payer: BC Managed Care – PPO | Admitting: Nurse Practitioner

## 2014-02-23 VITALS — BP 125/73 | HR 81 | Temp 97.6°F | Ht 67.0 in | Wt 261.2 lb

## 2014-02-23 DIAGNOSIS — G20 Parkinson's disease: Secondary | ICD-10-CM

## 2014-02-23 DIAGNOSIS — G20A1 Parkinson's disease without dyskinesia, without mention of fluctuations: Secondary | ICD-10-CM

## 2014-02-23 DIAGNOSIS — M179 Osteoarthritis of knee, unspecified: Secondary | ICD-10-CM

## 2014-02-23 DIAGNOSIS — M1710 Unilateral primary osteoarthritis, unspecified knee: Secondary | ICD-10-CM

## 2014-02-23 DIAGNOSIS — R609 Edema, unspecified: Secondary | ICD-10-CM

## 2014-02-23 DIAGNOSIS — E669 Obesity, unspecified: Secondary | ICD-10-CM

## 2014-02-23 DIAGNOSIS — G20C Parkinsonism, unspecified: Secondary | ICD-10-CM | POA: Insufficient documentation

## 2014-02-23 DIAGNOSIS — F411 Generalized anxiety disorder: Secondary | ICD-10-CM

## 2014-02-23 DIAGNOSIS — Z1211 Encounter for screening for malignant neoplasm of colon: Secondary | ICD-10-CM

## 2014-02-23 DIAGNOSIS — R600 Localized edema: Secondary | ICD-10-CM

## 2014-02-23 DIAGNOSIS — IMO0002 Reserved for concepts with insufficient information to code with codable children: Secondary | ICD-10-CM

## 2014-02-23 DIAGNOSIS — E785 Hyperlipidemia, unspecified: Secondary | ICD-10-CM

## 2014-02-23 DIAGNOSIS — F319 Bipolar disorder, unspecified: Secondary | ICD-10-CM

## 2014-02-23 DIAGNOSIS — I10 Essential (primary) hypertension: Secondary | ICD-10-CM

## 2014-02-23 LAB — POCT CBC
GRANULOCYTE PERCENT: 70.1 % (ref 37–80)
HCT, POC: 38.4 % (ref 37.7–47.9)
Hemoglobin: 12.2 g/dL (ref 12.2–16.2)
Lymph, poc: 1.8 (ref 0.6–3.4)
MCH: 26.7 pg — AB (ref 27–31.2)
MCHC: 31.9 g/dL (ref 31.8–35.4)
MCV: 83.9 fL (ref 80–97)
MPV: 8.2 fL (ref 0–99.8)
PLATELET COUNT, POC: 389 10*3/uL (ref 142–424)
POC Granulocyte: 4.8 (ref 2–6.9)
POC LYMPH PERCENT: 26.1 %L (ref 10–50)
RBC: 4.6 M/uL (ref 4.04–5.48)
RDW, POC: 13.7 %
WBC: 6.8 10*3/uL (ref 4.6–10.2)

## 2014-02-23 MED ORDER — FUROSEMIDE 40 MG PO TABS: 40.0000 mg | ORAL_TABLET | Freq: Every morning | ORAL | Status: DC

## 2014-02-23 MED ORDER — FENOFIBRIC ACID 135 MG PO CPDR: 135.0000 mg | DELAYED_RELEASE_CAPSULE | Freq: Every day | ORAL | Status: DC

## 2014-02-23 MED ORDER — SIMVASTATIN 40 MG PO TABS: ORAL_TABLET | ORAL | Status: DC

## 2014-02-23 MED ORDER — LISINOPRIL 20 MG PO TABS: 20.0000 mg | ORAL_TABLET | Freq: Every day | ORAL | Status: DC

## 2014-02-23 MED ORDER — MELOXICAM 15 MG PO TABS: ORAL_TABLET | ORAL | Status: DC

## 2014-02-23 NOTE — Progress Notes (Signed)
Subjective:    Patient ID: Debbie Steele, female    DOB: 1956-11-02, 58 y.o.   MRN: [id number]  HPI  Pt here today for chronic medical follow up.  Has not been seen in a while.  Reports she is doing fairly well.  Seen in December by Dr. Zara Council (psychiatrist)and referred to neurologist (Dr. Berdine Addison) for new onset generalized body tremors.  Diagnosed with Parkinsonism and started on current medication therapy.  Since med adjustment, pt now only has mild intermittent bilateral hand tremor.  States she is now able to write again and generally functions well.  During that time, Dr. Zara Council made medication adjustments for bipolar therapy and she reports bipolar symptoms are well controlled.    Review of Systems  Constitutional: Negative for fever and fatigue.  HENT: Negative.   Respiratory: Negative for cough and shortness of breath.   Cardiovascular: Negative for chest pain, palpitations and leg swelling.  Gastrointestinal: Positive for constipation. Negative for abdominal pain and blood in stool.  Endocrine: Negative for cold intolerance and heat intolerance.  Genitourinary: Negative for dysuria.  Musculoskeletal: Positive for arthralgias (bilateral knee arthritis).  Skin: Positive for rash.  Neurological: Positive for tremors (hands bilaterally, jaw tremor.  now able to write again since starting on Sinemet IR.). Negative for dizziness and weakness.  Psychiatric/Behavioral: Positive for sleep disturbance (Has had difficulty sleeping recently. Dr. Zara Council (psych) started her on Trazadone 3 nights ago and sleep increased from 3-4 hours per night to 7-8 hours.). Negative for suicidal ideas, dysphoric mood and agitation. The patient is nervous/anxious (feels relieved since finding out definite diagnosis of Parkinsonism.  Feels like bipolar symptoms are pretty well controlled.).   All other systems reviewed and are negative.       Objective:   Physical Exam  Constitutional: She is oriented to  person, place, and time. She appears well-developed and well-nourished.  HENT:  Head: Normocephalic and atraumatic.  Right Ear: External ear normal.  Left Ear: External ear normal.  Nose: Nose normal.  Mouth/Throat: Oropharynx is clear and moist.  Eyes: Pupils are equal, round, and reactive to light.  Neck: Normal range of motion. Neck supple. No thyromegaly present.  Cardiovascular: Normal rate, regular rhythm and normal heart sounds.  Exam reveals no gallop and no friction rub.   No murmur heard. Pulmonary/Chest: Effort normal and breath sounds normal. No respiratory distress. She has no wheezes. She has no rales.  Abdominal: Soft. Bowel sounds are normal. She exhibits no distension and no mass. There is no tenderness. There is no rebound and no guarding.  Musculoskeletal: Normal range of motion.  Lymphadenopathy:    She has no cervical adenopathy.  Neurological: She is alert and oriented to person, place, and time. She displays normal reflexes. No cranial nerve deficit. She exhibits abnormal muscle tone. Coordination normal.  Mild hand tremor bilaterally  Skin: Skin is warm and dry.  Psychiatric: She has a normal mood and affect. Her behavior is normal.    BP 125/73  Pulse 81  Temp(Src) 97.6 F (36.4 C) (Oral)  Ht 5' 7" (1.702 m)  Wt 261 lb 3.2 oz (118.48 kg)  BMI 40.90 kg/m2       Assessment & Plan:   1. Parkinson disease   2. Parkinsonism   3. Encounter for screening colonoscopy   4. Obesity   5. Essential hypertension, benign   6. Bipolar disorder, unspecified   7. Anxiety state, unspecified   8. Hyperlipidemia LDL goal < 100   9.  Arthritis of knee, degenerative   10. Peripheral edema    Orders Placed This Encounter  Procedures  . CMP14+EGFR  . NMR, lipoprofile  . Ambulatory referral to Gastroenterology    Referral Priority:  Routine    Referral Type:  Consultation    Referral Reason:  Specialty Services Required    Requested Specialty:  Gastroenterology     Number of Visits Requested:  1  . POCT CBC   Meds ordered this encounter  Medications  . traZODone (DESYREL) 50 MG tablet    Sig: Take by mouth. Take 161m at HS  . entacapone (COMTAN) 200 MG tablet    Sig: Take 200 mg by mouth 4 (four) times daily.  . carbidopa-levodopa (SINEMET IR) 25-100 MG per tablet    Sig: Take 1 tablet by mouth 4 (four) times daily.  . primidone (MYSOLINE) 50 MG tablet    Sig: Take 50 mg by mouth at bedtime.  . Choline Fenofibrate (FENOFIBRIC ACID) 135 MG CPDR    Sig: Take 135 mg by mouth daily.    Dispense:  30 capsule    Refill:  5    Order Specific Question:  Supervising Provider    Answer:  MChipper Herb[[id number]]  . lisinopril (PRINIVIL,ZESTRIL) 20 MG tablet    Sig: Take 1 tablet (20 mg total) by mouth daily.    Dispense:  30 tablet    Refill:  5    Order Specific Question:  Supervising Provider    Answer:  MChipper Herb[[id number]]  . simvastatin (ZOCOR) 40 MG tablet    Sig: TAKE 1 TABLET (40 MG TOTAL) BY MOUTH AT BEDTIME.    Dispense:  30 tablet    Refill:  5    Order Specific Question:  Supervising Provider    Answer:  MChipper Herb[[id number]]  . meloxicam (MOBIC) 15 MG tablet    Sig: TAKE 1 TABLET (15 MG TOTAL) BY MOUTH DAILY.    Dispense:  30 tablet    Refill:  5    Order Specific Question:  Supervising Provider    Answer:  MChipper Herb[[id number]]  . furosemide (LASIX) 40 MG tablet    Sig: Take 1 tablet (40 mg total) by mouth every morning.    Dispense:  30 tablet    Refill:  5    Order Specific Question:  Supervising Provider    Answer:  MChipper Herb[[id number]]  patient to schedule mammo and PAP on way out today Labs pending Health maintenance reviewed Diet and exercise encouraged Continue all meds Follow up  In 3 months  Referred for colonoscopy   Mary-Margaret MHassell Done FNP

## 2014-02-23 NOTE — Patient Instructions (Signed)

## 2014-02-25 LAB — NMR, LIPOPROFILE
CHOLESTEROL: 156 mg/dL (ref <200)
HDL CHOLESTEROL BY NMR: 49 mg/dL (ref >=40)
HDL Particle Number: 42.9 umol/L (ref >=30.5)
LDL Particle Number: 1359 nmol/L — ABNORMAL HIGH (ref <1000)
LDL Size: 21.1 nm (ref >20.5)
LDLC SERPL CALC-MCNC: 81 mg/dL (ref <100)
LP-IR Score: 73 — ABNORMAL HIGH (ref <=45)
SMALL LDL PARTICLE NUMBER: 705 nmol/L — AB (ref <=527)
TRIGLYCERIDES BY NMR: 132 mg/dL (ref <150)

## 2014-02-25 LAB — CMP14+EGFR
A/G RATIO: 2.2 (ref 1.1–2.5)
ALBUMIN: 4.4 g/dL (ref 3.5–5.5)
ALT: 17 IU/L (ref 0–32)
AST: 23 IU/L (ref 0–40)
Alkaline Phosphatase: 40 IU/L (ref 39–117)
BUN/Creatinine Ratio: 13 (ref 9–23)
BUN: 13 mg/dL (ref 6–24)
CALCIUM: 9.8 mg/dL (ref 8.7–10.2)
CO2: 26 mmol/L (ref 18–29)
CREATININE: 1 mg/dL (ref 0.57–1.00)
Chloride: 98 mmol/L (ref 97–108)
GFR calc Af Amer: 73 mL/min/{1.73_m2} (ref >59)
GFR, EST NON AFRICAN AMERICAN: 64 mL/min/{1.73_m2} (ref >59)
GLOBULIN, TOTAL: 2 g/dL (ref 1.5–4.5)
Glucose: 102 mg/dL — ABNORMAL HIGH (ref 65–99)
Potassium: 5 mmol/L (ref 3.5–5.2)
Sodium: 140 mmol/L (ref 134–144)
TOTAL PROTEIN: 6.4 g/dL (ref 6.0–8.5)
Total Bilirubin: 0.2 mg/dL (ref 0.0–1.2)

## 2014-02-26 ENCOUNTER — Other Ambulatory Visit: Payer: BC Managed Care – PPO

## 2014-02-26 DIAGNOSIS — Z1212 Encounter for screening for malignant neoplasm of rectum: Secondary | ICD-10-CM

## 2014-02-26 NOTE — Progress Notes (Signed)
Pt came in for labs only 

## 2014-02-27 LAB — FECAL OCCULT BLOOD, IMMUNOCHEMICAL: FECAL OCCULT BLD: NEGATIVE

## 2014-03-05 ENCOUNTER — Other Ambulatory Visit: Payer: Self-pay | Admitting: *Deleted

## 2014-03-05 MED ORDER — PRIMIDONE 50 MG PO TABS: 50.0000 mg | ORAL_TABLET | Freq: Every day | ORAL | Status: DC

## 2014-04-06 ENCOUNTER — Encounter: Payer: Self-pay | Admitting: *Deleted

## 2014-04-18 ENCOUNTER — Other Ambulatory Visit: Payer: Self-pay | Admitting: *Deleted

## 2014-04-18 DIAGNOSIS — E785 Hyperlipidemia, unspecified: Secondary | ICD-10-CM

## 2014-04-18 MED ORDER — SIMVASTATIN 40 MG PO TABS: ORAL_TABLET | ORAL | Status: DC

## 2014-05-02 ENCOUNTER — Ambulatory Visit (INDEPENDENT_AMBULATORY_CARE_PROVIDER_SITE_OTHER): Payer: BC Managed Care – PPO | Admitting: Family Medicine

## 2014-05-02 ENCOUNTER — Encounter: Payer: Self-pay | Admitting: Family Medicine

## 2014-05-02 VITALS — BP 120/62 | HR 66 | Temp 97.1°F | Ht 67.0 in | Wt 257.2 lb

## 2014-05-02 DIAGNOSIS — J329 Chronic sinusitis, unspecified: Secondary | ICD-10-CM

## 2014-05-02 MED ORDER — AMOXICILLIN 875 MG PO TABS: 875.0000 mg | ORAL_TABLET | Freq: Two times a day (BID) | ORAL | Status: DC

## 2014-05-02 MED ORDER — METHYLPREDNISOLONE ACETATE 80 MG/ML IJ SUSP
80.0000 mg | Freq: Once | INTRAMUSCULAR | Status: AC
Start: 2014-05-02 — End: 2014-05-02
  Administered 2014-05-02: 80 mg via INTRAMUSCULAR

## 2014-05-02 NOTE — Progress Notes (Signed)
   Subjective:    Patient ID: Debbie Steele, female    DOB: Dec 29, 1955, 58 y.o.   MRN: [id number]  HPI This 58 y.o. female presents for evaluation of sinus pressure and sinus pain for over a week.   Review of Systems No chest pain, SOB, HA, dizziness, vision change, N/V, diarrhea, constipation, dysuria, urinary urgency or frequency, myalgias, arthralgias or rash.     Objective:   Physical Exam Vital signs noted  Well developed well nourished female.  HEENT - Head atraumatic Normocephalic                Eyes - PERRLA, Conjuctiva - clear Sclera- Clear EOMI                Ears - EAC's Wnl TM's Wnl Gross Hearing WNL                Nose - Nares patent                 Throat - oropharanx wnl Respiratory - Lungs CTA bilateral Cardiac - RRR S1 and S2 without murmur GI - Abdomen soft Nontender and bowel sounds active x 4 Extremities - No edema. Neuro - Grossly intact.       Assessment & Plan:  Sinusitis - Plan: methylPREDNISolone acetate (DEPO-MEDROL) injection 80 mg, amoxicillin (AMOXIL) 875 MG tablet Push po fluids, rest, tylenol and motrin otc prn as directed for fever, arthralgias, and myalgias.  Follow up prn if sx's continue or persist.  Lysbeth Penner FNP

## 2014-05-12 ENCOUNTER — Telehealth: Payer: Self-pay | Admitting: Nurse Practitioner

## 2014-05-14 ENCOUNTER — Encounter: Payer: Self-pay | Admitting: Family

## 2014-05-14 ENCOUNTER — Ambulatory Visit (INDEPENDENT_AMBULATORY_CARE_PROVIDER_SITE_OTHER): Payer: BC Managed Care – PPO | Admitting: Family

## 2014-05-14 VITALS — BP 125/64 | HR 63 | Temp 98.5°F | Ht 68.0 in | Wt 253.4 lb

## 2014-05-14 DIAGNOSIS — N898 Other specified noninflammatory disorders of vagina: Secondary | ICD-10-CM

## 2014-05-14 DIAGNOSIS — N939 Abnormal uterine and vaginal bleeding, unspecified: Secondary | ICD-10-CM

## 2014-05-14 MED ORDER — NORETHINDRONE ACETATE 5 MG PO TABS: 5.0000 mg | ORAL_TABLET | Freq: Every day | ORAL | Status: DC

## 2014-05-14 NOTE — Patient Instructions (Signed)

## 2014-05-14 NOTE — Telephone Encounter (Signed)
appt given for today at 10

## 2014-05-14 NOTE — Progress Notes (Signed)
   Subjective:    Patient ID: Debbie Steele, female    DOB: Apr 07, 1956, 58 y.o.   MRN: [id number]  Vaginal Bleeding  Pt presents to the office with vaginal bleeding for the last 19 days. Pt states this is abnormal for her. Her last menes was in March and lasted 4-5 days. She has had irregular periods for the last year, skipping two months or so but not this amount of bleeding. Pt reports heavy bleeding for a week or so and is now spotting. Denies any abdominal pain, rashes, or discharge.     Review of Systems  Constitutional: Positive for fatigue.  HENT: Negative.   Respiratory: Negative.   Cardiovascular: Negative.   Genitourinary: Positive for vaginal bleeding.  Psychiatric/Behavioral: Negative.   All other systems reviewed and are negative.      Objective:   Physical Exam  Vitals reviewed. Constitutional: She is oriented to person, place, and time. She appears well-developed and well-nourished. No distress.  HENT:  Head: Normocephalic and atraumatic.  Right Ear: External ear normal.  Mouth/Throat: Oropharynx is clear and moist.  Eyes: Pupils are equal, round, and reactive to light.  Neck: Normal range of motion. Neck supple. No thyromegaly present.  Cardiovascular: Normal rate, regular rhythm, normal heart sounds and intact distal pulses.   No murmur heard. Pulmonary/Chest: Effort normal and breath sounds normal. No respiratory distress. She has no wheezes.  Abdominal: Soft. Bowel sounds are normal. She exhibits no distension. There is no tenderness.  Musculoskeletal: Normal range of motion. She exhibits no edema and no tenderness.  Neurological: She is alert and oriented to person, place, and time. She has normal reflexes. No cranial nerve deficit.  Skin: Skin is warm and dry.  Psychiatric: She has a normal mood and affect. Her behavior is normal. Judgment and thought content normal.     BP 125/64  Pulse 63  Temp(Src) 98.5 F (36.9 C) (Oral)  Ht $R'5\' 8"'Iz$  (1.727 m)   Wt 253 lb 6.4 oz (114.941 kg)  BMI 38.54 kg/m2  LMP 04/24/2014      Assessment & Plan:  1. Abnormal vaginal bleeding -Discussed menapause systpoms -If all blood work WNL and bleeding continues will need referral to OB for biopsy and/or ultrasound - Anemia Profile B - Thyroid Panel With TSH - FSH/LH - CMP14+EGFR - norethindrone (AYGESTIN) 5 MG tablet; Take 1 tablet (5 mg total) by mouth daily.  Dispense: 30 tablet; Refill: 1 -Has physical appointment with Mary-Margaret-Will follow-up  Evelina Dun, FNP

## 2014-05-15 ENCOUNTER — Other Ambulatory Visit (INDEPENDENT_AMBULATORY_CARE_PROVIDER_SITE_OTHER): Payer: BC Managed Care – PPO

## 2014-05-15 ENCOUNTER — Other Ambulatory Visit: Payer: Self-pay | Admitting: *Deleted

## 2014-05-15 ENCOUNTER — Other Ambulatory Visit: Payer: Self-pay | Admitting: Nurse Practitioner

## 2014-05-15 DIAGNOSIS — E875 Hyperkalemia: Secondary | ICD-10-CM

## 2014-05-15 LAB — ANEMIA PROFILE B
BASOS: 1 %
Basophils Absolute: 0 10*3/uL (ref 0.0–0.2)
EOS: 1 %
Eosinophils Absolute: 0.1 10*3/uL (ref 0.0–0.4)
FERRITIN: 30 ng/mL (ref 15–150)
Folate: 12.9 ng/mL (ref >3.0)
HCT: 38 % (ref 34.0–46.6)
HEMOGLOBIN: 12.5 g/dL (ref 11.1–15.9)
IMMATURE GRANS (ABS): 0 10*3/uL (ref 0.0–0.1)
IMMATURE GRANULOCYTES: 0 %
Iron Saturation: 19 % (ref 15–55)
Iron: 78 ug/dL (ref 35–155)
Lymphocytes Absolute: 1.3 10*3/uL (ref 0.7–3.1)
Lymphs: 31 %
MCH: 28.5 pg (ref 26.6–33.0)
MCHC: 32.9 g/dL (ref 31.5–35.7)
MCV: 87 fL (ref 79–97)
Monocytes Absolute: 0.4 10*3/uL (ref 0.1–0.9)
Monocytes: 11 %
NEUTROS PCT: 56 %
Neutrophils Absolute: 2.4 10*3/uL (ref 1.4–7.0)
Platelets: 373 10*3/uL (ref 150–379)
RBC: 4.38 x10E6/uL (ref 3.77–5.28)
RDW: 14.2 % (ref 12.3–15.4)
RETIC CT PCT: 1.3 % (ref 0.6–2.6)
TIBC: 411 ug/dL (ref 250–450)
UIBC: 333 ug/dL (ref 150–375)
VITAMIN B 12: 470 pg/mL (ref 211–946)
WBC: 4.2 10*3/uL (ref 3.4–10.8)

## 2014-05-15 LAB — CMP14+EGFR
A/G RATIO: 2 (ref 1.1–2.5)
ALT: 17 IU/L (ref 0–32)
AST: 21 IU/L (ref 0–40)
Albumin: 4.4 g/dL (ref 3.5–5.5)
Alkaline Phosphatase: 43 IU/L (ref 39–117)
BUN/Creatinine Ratio: 15 (ref 9–23)
BUN: 14 mg/dL (ref 6–24)
CALCIUM: 9.9 mg/dL (ref 8.7–10.2)
CO2: 22 mmol/L (ref 18–29)
Chloride: 102 mmol/L (ref 97–108)
Creatinine, Ser: 0.94 mg/dL (ref 0.57–1.00)
GFR, EST AFRICAN AMERICAN: 79 mL/min/{1.73_m2} (ref >59)
GFR, EST NON AFRICAN AMERICAN: 69 mL/min/{1.73_m2} (ref >59)
Globulin, Total: 2.2 g/dL (ref 1.5–4.5)
Glucose: 100 mg/dL — ABNORMAL HIGH (ref 65–99)
POTASSIUM: 6.3 mmol/L — AB (ref 3.5–5.2)
SODIUM: 143 mmol/L (ref 134–144)
Total Bilirubin: 0.2 mg/dL (ref 0.0–1.2)
Total Protein: 6.6 g/dL (ref 6.0–8.5)

## 2014-05-15 LAB — FSH/LH
FSH: 22.1 m[IU]/mL
LH: 15.3 m[IU]/mL

## 2014-05-15 LAB — THYROID PANEL WITH TSH
Free Thyroxine Index: 1.9 (ref 1.2–4.9)
T3 UPTAKE RATIO: 29 % (ref 24–39)
T4 TOTAL: 6.4 ug/dL (ref 4.5–12.0)
TSH: 2.6 u[IU]/mL (ref 0.450–4.500)

## 2014-05-15 LAB — POTASSIUM: Potassium: 4.3 mmol/L (ref 3.5–5.2)

## 2014-05-15 NOTE — Progress Notes (Signed)
Pt came in for potassium repeat

## 2014-05-16 ENCOUNTER — Telehealth: Payer: Self-pay | Admitting: Family Medicine

## 2014-05-16 NOTE — Telephone Encounter (Signed)
Message copied by Waverly Ferrari on Wed May 16, 2014 10:00 AM ------      Message from: HAWKS, [place] A      Created: Wed May 16, 2014  9:03 AM       Anemia Profile (WBC, PLT, Hgb, Iron, and Vit B 12) WNL      Thyroid levels WNL      Hormones levels showing close to postmenopausal state      Kidney and liver function stable (K+ redrawn and its WNL)             ------

## 2014-05-25 ENCOUNTER — Telehealth: Payer: Self-pay | Admitting: Family

## 2014-05-25 NOTE — Telephone Encounter (Signed)
error 

## 2014-05-29 ENCOUNTER — Ambulatory Visit (INDEPENDENT_AMBULATORY_CARE_PROVIDER_SITE_OTHER): Payer: BC Managed Care – PPO | Admitting: Nurse Practitioner

## 2014-05-29 ENCOUNTER — Encounter: Payer: Self-pay | Admitting: Nurse Practitioner

## 2014-05-29 VITALS — BP 146/74 | HR 80 | Temp 99.0°F | Ht 68.0 in | Wt 251.0 lb

## 2014-05-29 DIAGNOSIS — G20 Parkinson's disease: Secondary | ICD-10-CM

## 2014-05-29 DIAGNOSIS — Z01419 Encounter for gynecological examination (general) (routine) without abnormal findings: Secondary | ICD-10-CM

## 2014-05-29 DIAGNOSIS — G20C Parkinsonism, unspecified: Secondary | ICD-10-CM

## 2014-05-29 DIAGNOSIS — Z124 Encounter for screening for malignant neoplasm of cervix: Secondary | ICD-10-CM

## 2014-05-29 DIAGNOSIS — F319 Bipolar disorder, unspecified: Secondary | ICD-10-CM

## 2014-05-29 DIAGNOSIS — F411 Generalized anxiety disorder: Secondary | ICD-10-CM

## 2014-05-29 DIAGNOSIS — N949 Unspecified condition associated with female genital organs and menstrual cycle: Secondary | ICD-10-CM

## 2014-05-29 DIAGNOSIS — N938 Other specified abnormal uterine and vaginal bleeding: Secondary | ICD-10-CM

## 2014-05-29 DIAGNOSIS — Z0000 Encounter for general adult medical examination without abnormal findings: Secondary | ICD-10-CM

## 2014-05-29 DIAGNOSIS — E785 Hyperlipidemia, unspecified: Secondary | ICD-10-CM

## 2014-05-29 DIAGNOSIS — I10 Essential (primary) hypertension: Secondary | ICD-10-CM

## 2014-05-29 DIAGNOSIS — E669 Obesity, unspecified: Secondary | ICD-10-CM

## 2014-05-29 LAB — POCT CBC
Granulocyte percent: 70.2 %G (ref 37–80)
HEMATOCRIT: 41.9 % (ref 37.7–47.9)
HEMOGLOBIN: 12.7 g/dL (ref 12.2–16.2)
LYMPH, POC: 1.6 (ref 0.6–3.4)
MCH, POC: 26.4 pg — AB (ref 27–31.2)
MCHC: 30.4 g/dL — AB (ref 31.8–35.4)
MCV: 86.8 fL (ref 80–97)
MPV: 8.7 fL (ref 0–99.8)
POC Granulocyte: 4.4 (ref 2–6.9)
POC LYMPH PERCENT: 26.3 %L (ref 10–50)
Platelet Count, POC: 359 10*3/uL (ref 142–424)
RBC: 4.8 M/uL (ref 4.04–5.48)
RDW, POC: 14.2 %
WBC: 6.2 10*3/uL (ref 4.6–10.2)

## 2014-05-29 LAB — POCT URINALYSIS DIPSTICK
Bilirubin, UA: NEGATIVE
Glucose, UA: NEGATIVE
Ketones, UA: NEGATIVE
NITRITE UA: NEGATIVE
PH UA: 5
SPEC GRAV UA: 1.01
Urobilinogen, UA: NEGATIVE

## 2014-05-29 LAB — POCT UA - MICROSCOPIC ONLY
CASTS, UR, LPF, POC: NEGATIVE
CRYSTALS, UR, HPF, POC: NEGATIVE
Mucus, UA: NEGATIVE
Yeast, UA: NEGATIVE

## 2014-05-29 NOTE — Progress Notes (Signed)
Subjective:    Patient ID: Debbie Steele, female    DOB: 02/22/1956, 58 y.o.   MRN: [id number]  Patient here today for follow up of chronic medical problems and PAP- Patient says that she came in to be seen for dysfunctional bleeding 2 weeks ago- was given provera to stop menses and soon as she stopped taking she started bleeding again. Patiuent say sthat she has become very emotional and thinks it is hormone related  Hypertension This is a chronic problem. The current episode started more than 1 year ago. The problem is unchanged. The problem is uncontrolled. Pertinent negatives include no blurred vision, headaches, neck pain, palpitations, PND or sweats. Risk factors for coronary artery disease include dyslipidemia, obesity, post-menopausal state and smoking/tobacco exposure. Past treatments include diuretics and ACE inhibitors. The current treatment provides moderate improvement. Compliance problems include diet and exercise.   Hyperlipidemia This is a chronic problem. The current episode started more than 1 year ago. The problem is uncontrolled. Recent lipid tests were reviewed and are high. Factors aggravating her hyperlipidemia include smoking. Current antihyperlipidemic treatment includes statins. The current treatment provides moderate improvement of lipids. Compliance problems include adherence to diet and adherence to exercise.  Risk factors for coronary artery disease include dyslipidemia, hypertension, obesity and post-menopausal.  insomnia trazadone helps her sleep well and feels rested in AM Depression/GAD/Bipolar Zoloft,lamictal and abilify- xanax daily- combination works well for her- says that she feels the best she has in years. Parkinsonism See's neurologist- on sinemet and is working well.  Review of Systems  Eyes: Negative for blurred vision.  Respiratory: Negative.   Cardiovascular: Negative.  Negative for palpitations and PND.  Genitourinary: Negative.     Musculoskeletal: Negative for neck pain.  Neurological: Negative for headaches.  Psychiatric/Behavioral: Positive for dysphoric mood. Negative for suicidal ideas, behavioral problems and sleep disturbance.       Objective:   Physical Exam  Constitutional: She is oriented to person, place, and time. She appears well-developed and well-nourished.  HENT:  Head: Normocephalic.  Right Ear: Hearing, tympanic membrane, external ear and ear canal normal.  Left Ear: Hearing, tympanic membrane, external ear and ear canal normal.  Nose: Nose normal.  Mouth/Throat: Uvula is midline and oropharynx is clear and moist.  Eyes: Conjunctivae and EOM are normal. Pupils are equal, round, and reactive to light.  Neck: Normal range of motion and full passive range of motion without pain. Neck supple. No JVD present. Carotid bruit is not present. No mass and no thyromegaly present.  Cardiovascular: Normal rate, normal heart sounds and intact distal pulses.   No murmur heard. Pulmonary/Chest: Effort normal and breath sounds normal. Right breast exhibits no inverted nipple, no mass, no nipple discharge, no skin change and no tenderness. Left breast exhibits no inverted nipple, no mass, no nipple discharge, no skin change and no tenderness.  Abdominal: Soft. Bowel sounds are normal. She exhibits no mass. There is no tenderness.  Genitourinary: Vagina normal and uterus normal. No breast swelling, tenderness, discharge or bleeding.  bimanual exam-No adnexal masses or tenderness. Cervix parous and pink- no discharge  Musculoskeletal: Normal range of motion.  Lymphadenopathy:    She has no cervical adenopathy.  Neurological: She is alert and oriented to person, place, and time.  Skin: Skin is warm and dry.  Psychiatric: She has a normal mood and affect. Her behavior is normal. Judgment and thought content normal.  Tearful during exam   BP 146/74  Pulse 80  Temp(Src) 99 F (  37.2 C) (Oral)  Ht 5' 8" (1.727 m)   Wt 251 lb (113.853 kg)  BMI 38.17 kg/m2  LMP 04/24/2014        Assessment & Plan:   1. Encounter for routine gynecological examination   2. Parkinsonism   3. Obesity   4. Other and unspecified hyperlipidemia   5. Essential hypertension, benign   6. Bipolar disorder, unspecified   7. Anxiety state, unspecified   8. Annual physical exam   9. Dysfunctional uterine bleeding    Orders Placed This Encounter  Procedures  . CMP14+EGFR  . NMR, lipoprofile  . Thyroid Panel With TSH  . Ambulatory referral to Obstetrics / Gynecology    Referral Priority:  Routine    Referral Type:  Consultation    Referral Reason:  Specialty Services Required    Requested Specialty:  Obstetrics and Gynecology    Number of Visits Requested:  1  . POCT urinalysis dipstick  . POCT UA - Microscopic Only  . POCT CBC   Meds ordered this encounter  Medications  . ALPRAZolam (XANAX XR) 1 MG 24 hr tablet    Sig: Take 1 mg by mouth daily.   Go ahead and finish provera as rx Labs pending Health maintenance reviewed Diet and exercise encouraged Continue all meds Follow up  In 3 months   [place], FNP

## 2014-05-29 NOTE — Patient Instructions (Signed)
Abnormal Uterine Bleeding °Abnormal uterine bleeding means bleeding from the vagina that is not your normal menstrual period. This can be: °· Bleeding or spotting between periods. °· Bleeding after sex (sexual intercourse). °· Bleeding that is heavier or more than normal. °· Periods that last longer than usual. °· Bleeding after menopause. °There are many problems that may cause this. Treatment will depend on the cause of the bleeding. Any kind of bleeding that is not normal should be reviewed by your doctor.  °HOME CARE °Watch your condition for any changes. These actions may lessen any discomfort you are having: °· Do not use tampons or douches as told by your doctor. °· Change your pads often. °You should get regular pelvic exams and Pap tests. Keep all appointments for tests as told by your doctor. °GET HELP IF: °· You are bleeding for more than 1 week. °· You feel dizzy at times. °GET HELP RIGHT AWAY IF:  °· You pass out. °· You have to change pads every 15 to 30 minutes. °· You have belly pain. °· You have a fever. °· You become sweaty or weak. °· You are passing large blood clots from the vagina. °· You feel sick to your stomach (nauseous) and throw up (vomit). °MAKE SURE YOU: °· Understand these instructions. °· Will watch your condition. °· Will get help right away if you are not doing well or get worse. °Document Released: 09/20/2009 Document Revised: 11/28/2013 Document Reviewed: 06/22/2013 °Exit[employer]® Patient Information ©2015 [employer]. This information is not intended to replace advice given to you by your health care Amberrose Friebel. Make sure you discuss any questions you have with your health care Ariadna Setter. ° °

## 2014-05-30 ENCOUNTER — Other Ambulatory Visit (INDEPENDENT_AMBULATORY_CARE_PROVIDER_SITE_OTHER): Payer: BC Managed Care – PPO

## 2014-05-30 DIAGNOSIS — R7989 Other specified abnormal findings of blood chemistry: Secondary | ICD-10-CM

## 2014-05-30 LAB — CMP14+EGFR
A/G RATIO: 1.7 (ref 1.1–2.5)
ALT: 12 IU/L (ref 0–32)
AST: 23 IU/L (ref 0–40)
Albumin: 4.4 g/dL (ref 3.5–5.5)
Alkaline Phosphatase: 36 IU/L — ABNORMAL LOW (ref 39–117)
BUN/Creatinine Ratio: 11 (ref 9–23)
BUN: 12 mg/dL (ref 6–24)
CO2: 24 mmol/L (ref 18–29)
CREATININE: 1.11 mg/dL — AB (ref 0.57–1.00)
Calcium: 9.9 mg/dL (ref 8.7–10.2)
Chloride: 105 mmol/L (ref 97–108)
GFR, EST AFRICAN AMERICAN: 65 mL/min/{1.73_m2} (ref >59)
GFR, EST NON AFRICAN AMERICAN: 56 mL/min/{1.73_m2} — AB (ref >59)
GLOBULIN, TOTAL: 2.6 g/dL (ref 1.5–4.5)
GLUCOSE: 100 mg/dL — AB (ref 65–99)
POTASSIUM: 6.2 mmol/L — AB (ref 3.5–5.2)
Sodium: 143 mmol/L (ref 134–144)
TOTAL PROTEIN: 7 g/dL (ref 6.0–8.5)
Total Bilirubin: 0.3 mg/dL (ref 0.0–1.2)

## 2014-05-30 LAB — THYROID PANEL WITH TSH
Free Thyroxine Index: 1.8 (ref 1.2–4.9)
T3 Uptake Ratio: 28 % (ref 24–39)
T4 TOTAL: 6.6 ug/dL (ref 4.5–12.0)
TSH: 2.66 u[IU]/mL (ref 0.450–4.500)

## 2014-05-30 LAB — NMR, LIPOPROFILE
Cholesterol: 164 mg/dL (ref 100–199)
HDL Cholesterol by NMR: 47 mg/dL (ref >39)
HDL Particle Number: 36.2 umol/L (ref >=30.5)
LDL Particle Number: 1294 nmol/L — ABNORMAL HIGH (ref <1000)
LDL Size: 21.2 nm (ref >20.5)
LDLC SERPL CALC-MCNC: 104 mg/dL — ABNORMAL HIGH (ref 0–99)
LP-IR Score: 52 — ABNORMAL HIGH (ref <=45)
SMALL LDL PARTICLE NUMBER: 768 nmol/L — AB (ref <=527)
TRIGLYCERIDES BY NMR: 66 mg/dL (ref 0–149)

## 2014-05-30 LAB — PAP IG W/ RFLX HPV ASCU: PAP SMEAR COMMENT: 0

## 2014-05-31 LAB — POTASSIUM: Potassium: 5.2 mmol/L (ref 3.5–5.2)

## 2014-07-14 ENCOUNTER — Other Ambulatory Visit: Payer: Self-pay | Admitting: Nurse Practitioner

## 2014-08-12 ENCOUNTER — Other Ambulatory Visit: Payer: Self-pay | Admitting: Nurse Practitioner

## 2014-09-03 ENCOUNTER — Other Ambulatory Visit: Payer: Self-pay | Admitting: Nurse Practitioner

## 2014-10-01 ENCOUNTER — Other Ambulatory Visit: Payer: Self-pay | Admitting: Nurse Practitioner

## 2014-10-02 ENCOUNTER — Ambulatory Visit (INDEPENDENT_AMBULATORY_CARE_PROVIDER_SITE_OTHER): Payer: BC Managed Care – PPO | Admitting: Family Medicine

## 2014-10-02 VITALS — BP 143/70 | HR 90 | Temp 97.8°F | Ht 67.0 in | Wt 239.0 lb

## 2014-10-02 DIAGNOSIS — I10 Essential (primary) hypertension: Secondary | ICD-10-CM

## 2014-10-02 MED ORDER — CLONIDINE HCL 0.1 MG PO TABS: 0.1000 mg | ORAL_TABLET | Freq: Three times a day (TID) | ORAL | Status: DC

## 2014-10-02 NOTE — Progress Notes (Signed)
   Subjective:    Patient ID: Vanita Ingles, female    DOB: Nov 13, 1956, 57 y.o.   MRN: [id number]  HPI This 58 y.o. female presents for evaluation of hypertension. She is also having hot flashes and has insomnia.  Review of Systems No chest pain, SOB, HA, dizziness, vision change, N/V, diarrhea, constipation, dysuria, urinary urgency or frequency, myalgias, arthralgias or rash.     Objective:   Physical Exam Vital signs noted  Well developed well nourished female.  HEENT - Head atraumatic Normocephalic                Eyes - PERRLA, Conjuctiva - clear Sclera- Clear EOMI                Ears - EAC's Wnl TM's Wnl Gross Hearing WNL                Nose - Nares patent                 Throat - oropharanx wnl Respiratory - Lungs CTA bilateral Cardiac - RRR S1 and S2 without murmur GI - Abdomen soft Nontender and bowel sounds active x 4 Extremities - No edema. Neuro - Grossly intact.       Assessment & Plan:  HTN - clonidine 0.1mg  one po tid #90w/11 rf  Lysbeth Penner FNP

## 2014-10-26 ENCOUNTER — Other Ambulatory Visit: Payer: Self-pay | Admitting: Nurse Practitioner

## 2014-10-27 NOTE — Telephone Encounter (Signed)
Please review

## 2014-11-07 ENCOUNTER — Ambulatory Visit: Payer: BC Managed Care – PPO | Admitting: Family Medicine

## 2014-11-08 ENCOUNTER — Ambulatory Visit: Payer: BC Managed Care – PPO | Admitting: Family Medicine

## 2014-11-16 ENCOUNTER — Encounter: Payer: Self-pay | Admitting: *Deleted

## 2014-12-09 ENCOUNTER — Other Ambulatory Visit: Payer: Self-pay | Admitting: Family Medicine

## 2014-12-23 ENCOUNTER — Other Ambulatory Visit: Payer: Self-pay | Admitting: Nurse Practitioner

## 2014-12-24 ENCOUNTER — Ambulatory Visit (INDEPENDENT_AMBULATORY_CARE_PROVIDER_SITE_OTHER): Payer: BLUE CROSS/BLUE SHIELD | Admitting: Nurse Practitioner

## 2014-12-24 ENCOUNTER — Encounter: Payer: Self-pay | Admitting: Nurse Practitioner

## 2014-12-24 VITALS — BP 138/84 | HR 66 | Temp 97.6°F | Ht 67.0 in | Wt 248.0 lb

## 2014-12-24 DIAGNOSIS — I10 Essential (primary) hypertension: Secondary | ICD-10-CM

## 2014-12-24 NOTE — Progress Notes (Signed)
   Subjective:    Patient ID: Debbie Steele, female    DOB: 17-Feb-1956, 59 y.o.   MRN: [id number]  HPI Patient in today for blood pressure recheck- She has been seeing B. [facility]. He started clonidine in October. She is taking it TID. Blood pressure at home has been running 353 systolic- She denies any headcahe or chest pain.    Review of Systems  Constitutional: Negative.   HENT: Negative.   Respiratory: Negative for chest tightness and shortness of breath.   Cardiovascular: Negative for chest pain, palpitations and leg swelling.  Gastrointestinal: Negative.   Genitourinary: Negative.   Neurological: Negative for dizziness and headaches.  Psychiatric/Behavioral: Negative.   All other systems reviewed and are negative.      Objective:   Physical Exam  Constitutional: She is oriented to person, place, and time. She appears well-developed and well-nourished. No distress.  Cardiovascular: Normal rate, regular rhythm and normal heart sounds.   Pulmonary/Chest: Effort normal and breath sounds normal.  Neurological: She is alert and oriented to person, place, and time.  Skin: Skin is warm and dry.  Psychiatric: She has a normal mood and affect. Her behavior is normal. Judgment and thought content normal.   BP 138/84 mmHg  Pulse 66  Temp(Src) 97.6 F (36.4 C) (Oral)  Ht 5\' 7"  (1.702 m)  Wt 248 lb (112.492 kg)  BMI 38.83 kg/m2         Assessment & Plan:   1. Essential hypertension, benign   keep diary of blood pressure Continue current meds Follow up in 1 month for regualar follow up Do not add salt to diet  Mary-Margaret Hassell Done, FNP

## 2014-12-24 NOTE — Patient Instructions (Signed)

## 2015-01-07 ENCOUNTER — Other Ambulatory Visit: Payer: Self-pay | Admitting: Family

## 2015-01-08 ENCOUNTER — Other Ambulatory Visit: Payer: Self-pay | Admitting: Nurse Practitioner

## 2015-01-08 NOTE — Telephone Encounter (Signed)
Last seen 10/24/14 MMM  Last lipid 05/29/14

## 2015-01-10 ENCOUNTER — Other Ambulatory Visit: Payer: Self-pay | Admitting: Nurse Practitioner

## 2015-01-10 ENCOUNTER — Other Ambulatory Visit: Payer: Self-pay | Admitting: Family

## 2015-01-10 ENCOUNTER — Other Ambulatory Visit: Payer: Self-pay | Admitting: Family Medicine

## 2015-01-25 ENCOUNTER — Ambulatory Visit: Payer: BLUE CROSS/BLUE SHIELD | Admitting: Nurse Practitioner

## 2015-02-05 ENCOUNTER — Other Ambulatory Visit: Payer: Self-pay | Admitting: Nurse Practitioner

## 2015-08-12 ENCOUNTER — Other Ambulatory Visit: Payer: Self-pay | Admitting: Nurse Practitioner

## 2015-08-12 ENCOUNTER — Other Ambulatory Visit: Payer: Self-pay | Admitting: Family

## 2015-08-12 ENCOUNTER — Other Ambulatory Visit: Payer: Self-pay | Admitting: Family Medicine

## 2015-08-13 NOTE — Telephone Encounter (Signed)
no more refills without being seen  

## 2015-08-13 NOTE — Telephone Encounter (Signed)
Last seen 12/24/14  MMM

## 2015-08-13 NOTE — Telephone Encounter (Signed)
Last seen 12/24/14 MMM

## 2016-09-21 ENCOUNTER — Ambulatory Visit (INDEPENDENT_AMBULATORY_CARE_PROVIDER_SITE_OTHER): Payer: BLUE CROSS/BLUE SHIELD | Admitting: Family Medicine

## 2016-09-21 ENCOUNTER — Encounter: Payer: Self-pay | Admitting: Family Medicine

## 2016-09-21 VITALS — BP 134/83 | HR 75 | Temp 97.1°F | Ht 67.0 in | Wt 224.1 lb

## 2016-09-21 DIAGNOSIS — M1712 Unilateral primary osteoarthritis, left knee: Secondary | ICD-10-CM

## 2016-09-21 MED ORDER — METHYLPREDNISOLONE ACETATE 80 MG/ML IJ SUSP
80.0000 mg | Freq: Once | INTRAMUSCULAR | Status: AC
  Administered 2016-09-21: 80 mg via INTRAMUSCULAR

## 2016-09-21 NOTE — Progress Notes (Signed)
BP 134/83   Pulse 75   Temp 97.1 F (36.2 C) (Oral)   Ht 5\' 7"  (1.702 m)   Wt 224 lb 2 oz (101.7 kg)   LMP 04/24/2014   BMI 35.10 kg/m    Subjective:    Patient ID: Debbie Steele, female    DOB: 25-Nov-1956, 60 y.o.   MRN: [id number]  HPI: Debbie Steele is a 60 y.o. female presenting on 09/21/2016 for Establish Care and Left knee pain (had been on Mobic in past but had to stop because of perforations in intestinal tract)   HPI Left knee pain Patient has left knee pain that has been chronic and gradual and intermittent and comes back. Over the current time she's been having troubles with that over the past 2-3 months but she had to stop her Mobic that she is to take because of intestinal ulcerations and perforations. She denies any fevers or chills or erythema or warmth. She denies any numbness or weakness. Patient says pain gets up to 8 out of 10 after she works all day but his lows 5-10 after she rests overnight.  Relevant past medical, surgical, family and social history reviewed and updated as indicated. Interim medical history since our last visit reviewed. Allergies and medications reviewed and updated.  Review of Systems  Constitutional: Negative for chills and fever.  HENT: Negative for congestion, ear discharge and ear pain.   Eyes: Negative for redness and visual disturbance.  Respiratory: Negative for chest tightness, shortness of breath and wheezing.   Cardiovascular: Negative for chest pain and leg swelling.  Genitourinary: Negative for difficulty urinating and dysuria.  Musculoskeletal: Positive for arthralgias and joint swelling. Negative for back pain and gait problem.  Skin: Negative for rash.  Neurological: Negative for light-headedness and headaches.  Psychiatric/Behavioral: Negative for agitation and behavioral problems.  All other systems reviewed and are negative.   Per HPI unless specifically indicated above     Objective:    BP 134/83   Pulse  75   Temp 97.1 F (36.2 C) (Oral)   Ht 5\' 7"  (1.702 m)   Wt 224 lb 2 oz (101.7 kg)   LMP 04/24/2014   BMI 35.10 kg/m   Wt Readings from Last 3 Encounters:  09/21/16 224 lb 2 oz (101.7 kg)  12/24/14 248 lb (112.5 kg)  10/02/14 239 lb (108.4 kg)    Physical Exam  Constitutional: She is oriented to person, place, and time. She appears well-developed and well-nourished. No distress.  Eyes: Conjunctivae are normal.  Cardiovascular: Normal rate, regular rhythm, normal heart sounds and intact distal pulses.   No murmur heard. Pulmonary/Chest: Effort normal and breath sounds normal. No respiratory distress. She has no wheezes. She has no rales.  Musculoskeletal: Normal range of motion. She exhibits no edema.       Left knee: She exhibits normal range of motion, no swelling, no erythema, normal alignment, no LCL laxity, normal patellar mobility, no bony tenderness, normal meniscus and no MCL laxity. Tenderness found. Medial joint line tenderness noted.  Neurological: She is alert and oriented to person, place, and time. Coordination normal.  Skin: Skin is warm and dry. No rash noted. She is not diaphoretic.  Psychiatric: She has a normal mood and affect. Her behavior is normal.  Nursing note and vitals reviewed.      Assessment & Plan:   Problem List Items Addressed This Visit    None    Visit Diagnoses    Osteoarthritis of  left knee, unspecified osteoarthritis type    -  Primary   Relevant Medications   methylPREDNISolone acetate (DEPO-MEDROL) injection 80 mg       Follow up plan: Return in about 3 months (around 12/22/2016), or if symptoms worsen or fail to improve, for Well check and labs fasting.  Counseling provided for all of the vaccine components No orders of the defined types were placed in this encounter.   Caryl Pina, MD [facility] 09/21/2016, 2:28 PM

## 2016-09-25 ENCOUNTER — Other Ambulatory Visit (INDEPENDENT_AMBULATORY_CARE_PROVIDER_SITE_OTHER): Payer: BLUE CROSS/BLUE SHIELD

## 2016-09-25 DIAGNOSIS — Z1329 Encounter for screening for other suspected endocrine disorder: Secondary | ICD-10-CM

## 2016-09-25 DIAGNOSIS — I10 Essential (primary) hypertension: Secondary | ICD-10-CM

## 2016-09-25 DIAGNOSIS — Z13220 Encounter for screening for lipoid disorders: Secondary | ICD-10-CM

## 2016-09-25 DIAGNOSIS — R739 Hyperglycemia, unspecified: Secondary | ICD-10-CM

## 2016-09-25 DIAGNOSIS — R7989 Other specified abnormal findings of blood chemistry: Secondary | ICD-10-CM

## 2016-09-26 LAB — CBC WITH DIFFERENTIAL/PLATELET
BASOS ABS: 0 10*3/uL (ref 0.0–0.2)
Basos: 0 %
EOS (ABSOLUTE): 0.1 10*3/uL (ref 0.0–0.4)
Eos: 1 %
Hematocrit: 42.1 % (ref 34.0–46.6)
Hemoglobin: 14.2 g/dL (ref 11.1–15.9)
IMMATURE GRANS (ABS): 0 10*3/uL (ref 0.0–0.1)
IMMATURE GRANULOCYTES: 0 %
LYMPHS: 27 %
Lymphocytes Absolute: 1.3 10*3/uL (ref 0.7–3.1)
MCH: 30 pg (ref 26.6–33.0)
MCHC: 33.7 g/dL (ref 31.5–35.7)
MCV: 89 fL (ref 79–97)
Monocytes Absolute: 0.6 10*3/uL (ref 0.1–0.9)
Monocytes: 12 %
NEUTROS PCT: 60 %
Neutrophils Absolute: 3 10*3/uL (ref 1.4–7.0)
PLATELETS: 301 10*3/uL (ref 150–379)
RBC: 4.74 x10E6/uL (ref 3.77–5.28)
RDW: 14.1 % (ref 12.3–15.4)
WBC: 5 10*3/uL (ref 3.4–10.8)

## 2016-09-26 LAB — LIPID PANEL
Chol/HDL Ratio: 2.7 ratio units (ref 0.0–4.4)
Cholesterol, Total: 141 mg/dL (ref 100–199)
HDL: 52 mg/dL (ref >39)
LDL Calculated: 73 mg/dL (ref 0–99)
Triglycerides: 79 mg/dL (ref 0–149)
VLDL CHOLESTEROL CAL: 16 mg/dL (ref 5–40)

## 2016-09-26 LAB — CMP14+EGFR
ALT: 23 IU/L (ref 0–32)
AST: 22 IU/L (ref 0–40)
Albumin/Globulin Ratio: 1.9 (ref 1.2–2.2)
Albumin: 4.3 g/dL (ref 3.5–5.5)
Alkaline Phosphatase: 72 IU/L (ref 39–117)
BUN/Creatinine Ratio: 22 (ref 9–23)
BUN: 17 mg/dL (ref 6–24)
Bilirubin Total: 0.4 mg/dL (ref 0.0–1.2)
CALCIUM: 9.7 mg/dL (ref 8.7–10.2)
CHLORIDE: 98 mmol/L (ref 96–106)
CO2: 29 mmol/L (ref 18–29)
Creatinine, Ser: 0.79 mg/dL (ref 0.57–1.00)
GFR, EST AFRICAN AMERICAN: 95 mL/min/{1.73_m2} (ref >59)
GFR, EST NON AFRICAN AMERICAN: 83 mL/min/{1.73_m2} (ref >59)
GLUCOSE: 127 mg/dL — AB (ref 65–99)
Globulin, Total: 2.3 g/dL (ref 1.5–4.5)
Potassium: 4.9 mmol/L (ref 3.5–5.2)
Sodium: 141 mmol/L (ref 134–144)
TOTAL PROTEIN: 6.6 g/dL (ref 6.0–8.5)

## 2016-09-26 LAB — THYROID PANEL WITH TSH
Free Thyroxine Index: 2 (ref 1.2–4.9)
T3 Uptake Ratio: 29 % (ref 24–39)
T4, Total: 7 ug/dL (ref 4.5–12.0)
TSH: 1.99 u[IU]/mL (ref 0.450–4.500)

## 2016-09-30 LAB — HGB A1C W/O EAG: Hgb A1c MFr Bld: 5.9 % — ABNORMAL HIGH (ref 4.8–5.6)

## 2016-09-30 LAB — SPECIMEN STATUS REPORT

## 2017-03-31 ENCOUNTER — Encounter (INDEPENDENT_AMBULATORY_CARE_PROVIDER_SITE_OTHER): Payer: Self-pay

## 2017-03-31 ENCOUNTER — Other Ambulatory Visit: Payer: Self-pay | Admitting: *Deleted

## 2017-03-31 ENCOUNTER — Encounter: Payer: Self-pay | Admitting: Family Medicine

## 2017-03-31 ENCOUNTER — Ambulatory Visit (INDEPENDENT_AMBULATORY_CARE_PROVIDER_SITE_OTHER): Payer: Medicare Other | Admitting: Family Medicine

## 2017-03-31 VITALS — BP 116/63 | HR 72 | Temp 97.4°F | Ht 67.0 in | Wt 228.0 lb

## 2017-03-31 DIAGNOSIS — E785 Hyperlipidemia, unspecified: Secondary | ICD-10-CM

## 2017-03-31 DIAGNOSIS — Z1159 Encounter for screening for other viral diseases: Secondary | ICD-10-CM

## 2017-03-31 DIAGNOSIS — I10 Essential (primary) hypertension: Secondary | ICD-10-CM | POA: Diagnosis not present

## 2017-03-31 DIAGNOSIS — Z114 Encounter for screening for human immunodeficiency virus [HIV]: Secondary | ICD-10-CM

## 2017-03-31 MED ORDER — LISINOPRIL 40 MG PO TABS: 40.0000 mg | ORAL_TABLET | Freq: Every day | ORAL | 1 refills | Status: DC

## 2017-03-31 MED ORDER — SIMVASTATIN 40 MG PO TABS: ORAL_TABLET | ORAL | 1 refills | Status: DC

## 2017-03-31 MED ORDER — GLUCOSE BLOOD VI STRP: ORAL_STRIP | 11 refills | Status: AC

## 2017-03-31 MED ORDER — GLUCOSE BLOOD VI STRP: ORAL_STRIP | 12 refills | Status: DC

## 2017-03-31 NOTE — Progress Notes (Signed)
BP 116/63   Pulse 72   Temp 97.4 F (36.3 C) (Oral)   Ht 5' 7" (1.702 m)   Wt 228 lb (103.4 kg)   LMP 04/24/2014   BMI 35.71 kg/m    Subjective:    Patient ID: Debbie Steele, female    DOB: December 11, 1955, 61 y.o.   MRN: [id number]  HPI: Debbie Steele is a 61 y.o. female presenting on 03/31/2017 for Hypertension; Medication refills; and Labwork (patient is not fasting)   HPI Hyperlipidemia Patient is coming in for recheck of his hyperlipidemia. SHe is currently taking Simvastatin 40. SHe denies any issues with myalgias or history of liver damage from it. SHe denies any focal numbness or weakness or chest pain.   Hypertension recheck Patient is coming in with a blood pressure recheck today. She currently takes lisinopril 20 and clonidine at bedtime. Her blood pressure is 116/63. Patient denies headaches, blurred vision, chest pains, shortness of breath, or weakness. Denies any side effects from medication and is content with current medication. Patient has known obesity and we discussed weight loss and she knows to work on this and has been trying slowly.  Relevant past medical, surgical, family and social history reviewed and updated as indicated. Interim medical history since our last visit reviewed. Allergies and medications reviewed and updated.  Review of Systems  Constitutional: Negative for chills and fever.  HENT: Negative for ear pain and tinnitus.   Eyes: Negative for pain.  Respiratory: Negative for cough, chest tightness, shortness of breath and wheezing.   Cardiovascular: Negative for chest pain, palpitations and leg swelling.  Gastrointestinal: Negative for abdominal pain, blood in stool, constipation and diarrhea.  Genitourinary: Negative for difficulty urinating, dysuria and hematuria.  Musculoskeletal: Negative for back pain, gait problem and myalgias.  Skin: Negative for rash.  Neurological: Negative for dizziness, weakness, light-headedness and headaches.    Psychiatric/Behavioral: Negative for agitation, behavioral problems and suicidal ideas.  All other systems reviewed and are negative.   Per HPI unless specifically indicated above     Objective:    BP 116/63   Pulse 72   Temp 97.4 F (36.3 C) (Oral)   Ht 5' 7" (1.702 m)   Wt 228 lb (103.4 kg)   LMP 04/24/2014   BMI 35.71 kg/m   Wt Readings from Last 3 Encounters:  03/31/17 228 lb (103.4 kg)  09/21/16 224 lb 2 oz (101.7 kg)  12/24/14 248 lb (112.5 kg)    Physical Exam  Constitutional: She is oriented to person, place, and time. She appears well-developed and well-nourished. No distress.  Eyes: Conjunctivae are normal.  Neck: Neck supple. No thyromegaly present.  Cardiovascular: Normal rate, regular rhythm, normal heart sounds and intact distal pulses.   No murmur heard. Pulmonary/Chest: Effort normal and breath sounds normal. No respiratory distress. She has no wheezes.  Abdominal: Soft. Bowel sounds are normal. She exhibits no distension. There is no tenderness.  Musculoskeletal: Normal range of motion. She exhibits no edema or tenderness.  Lymphadenopathy:    She has no cervical adenopathy.  Neurological: She is alert and oriented to person, place, and time. Coordination normal.  Skin: Skin is warm and dry. No rash noted. She is not diaphoretic.  Psychiatric: She has a normal mood and affect. Her behavior is normal.  Nursing note and vitals reviewed.     Assessment & Plan:   Problem List Items Addressed This Visit      Cardiovascular and Mediastinum   Essential hypertension, benign -  Primary   Relevant Medications   lisinopril (PRINIVIL,ZESTRIL) 40 MG tablet   simvastatin (ZOCOR) 40 MG tablet   Other Relevant Orders   CMP14+EGFR     Other   Hyperlipidemia LDL goal <130   Relevant Medications   lisinopril (PRINIVIL,ZESTRIL) 40 MG tablet   simvastatin (ZOCOR) 40 MG tablet   Other Relevant Orders   Lipid panel   Morbid obesity ([place])    BMI of greater than  35 with hypertension and hyperlipidemia       Other Visit Diagnoses    Need for hepatitis C screening test       Relevant Orders   Hepatitis C antibody   Screening for HIV without presence of risk factors       Relevant Orders   HIV antibody       Follow up plan: Return in about 6 months (around 09/30/2017), or if symptoms worsen or fail to improve, for Hypertension and cholesterol recheck.  Counseling provided for all of the vaccine components Orders Placed This Encounter  Procedures  . CMP14+EGFR  . Lipid panel  . HIV antibody  . Hepatitis C antibody    Caryl Pina, MD [facility] 03/31/2017, 9:06 AM

## 2017-03-31 NOTE — Assessment & Plan Note (Signed)
BMI of greater than 35 with hypertension and hyperlipidemia

## 2017-04-01 LAB — CMP14+EGFR
ALT: 17 IU/L (ref 0–32)
AST: 19 IU/L (ref 0–40)
Albumin/Globulin Ratio: 1.7 (ref 1.2–2.2)
Albumin: 4 g/dL (ref 3.5–5.5)
Alkaline Phosphatase: 78 IU/L (ref 39–117)
BUN/Creatinine Ratio: 13 (ref 9–23)
BUN: 11 mg/dL (ref 6–24)
Bilirubin Total: 0.3 mg/dL (ref 0.0–1.2)
CO2: 28 mmol/L (ref 18–29)
CREATININE: 0.86 mg/dL (ref 0.57–1.00)
Calcium: 9.6 mg/dL (ref 8.7–10.2)
Chloride: 99 mmol/L (ref 96–106)
GFR calc Af Amer: 86 mL/min/{1.73_m2} (ref >59)
GFR calc non Af Amer: 75 mL/min/{1.73_m2} (ref >59)
GLUCOSE: 84 mg/dL (ref 65–99)
Globulin, Total: 2.3 g/dL (ref 1.5–4.5)
POTASSIUM: 4.9 mmol/L (ref 3.5–5.2)
Sodium: 139 mmol/L (ref 134–144)
Total Protein: 6.3 g/dL (ref 6.0–8.5)

## 2017-04-01 LAB — LIPID PANEL
CHOL/HDL RATIO: 3.1 ratio (ref 0.0–4.4)
Cholesterol, Total: 145 mg/dL (ref 100–199)
HDL: 47 mg/dL (ref >39)
LDL CALC: 64 mg/dL (ref 0–99)
Triglycerides: 172 mg/dL — ABNORMAL HIGH (ref 0–149)
VLDL Cholesterol Cal: 34 mg/dL (ref 5–40)

## 2017-04-01 LAB — HIV ANTIBODY (ROUTINE TESTING W REFLEX): HIV SCREEN 4TH GENERATION: NONREACTIVE

## 2017-04-01 LAB — HEPATITIS C ANTIBODY: Hep C Virus Ab: <0.1 s/co ratio (ref 0.0–0.9)

## 2017-04-20 ENCOUNTER — Ambulatory Visit (INDEPENDENT_AMBULATORY_CARE_PROVIDER_SITE_OTHER): Payer: Medicare Other | Admitting: Family

## 2017-04-20 ENCOUNTER — Encounter: Payer: Self-pay | Admitting: Family

## 2017-04-20 VITALS — BP 120/62 | HR 70 | Temp 98.6°F | Ht 67.0 in | Wt 230.2 lb

## 2017-04-20 DIAGNOSIS — M5432 Sciatica, left side: Secondary | ICD-10-CM

## 2017-04-20 MED ORDER — PREDNISONE 10 MG (21) PO TBPK: ORAL_TABLET | ORAL | 0 refills | Status: DC

## 2017-04-20 NOTE — Patient Instructions (Signed)
Sciatica Sciatica is pain, numbness, weakness, or tingling along the path of the sciatic nerve. The sciatic nerve starts in the lower back and runs down the back of each leg. The nerve controls the muscles in the lower leg and in the back of the knee. It also provides feeling (sensation) to the back of the thigh, the lower leg, and the sole of the foot. Sciatica is a symptom of another medical condition that pinches or puts pressure on the sciatic nerve. Generally, sciatica only affects one side of the body. Sciatica usually goes away on its own or with treatment. In some cases, sciatica may keep coming back (recur). What are the causes? This condition is caused by pressure on the sciatic nerve, or pinching of the sciatic nerve. This may be the result of:  A disk in between the bones of the spine (vertebrae) bulging out too far (herniated disk).  Age-related changes in the spinal disks (degenerative disk disease).  A pain disorder that affects a muscle in the buttock (piriformis syndrome).  Extra bone growth (bone spur) near the sciatic nerve.  An injury or break (fracture) of the pelvis.  Pregnancy.  Tumor (rare). What increases the risk? The following factors may make you more likely to develop this condition:  Playing sports that place pressure or stress on the spine, such as football or weight lifting.  Having poor strength and flexibility.  A history of back injury.  A history of back surgery.  Sitting for long periods of time.  Doing activities that involve repetitive bending or lifting.  Obesity. What are the signs or symptoms? Symptoms can vary from mild to very severe, and they may include:  Any of these problems in the lower back, leg, hip, or buttock:  Mild tingling or dull aches.  Burning sensations.  Sharp pains.  Numbness in the back of the calf or the sole of the foot.  Leg weakness.  Severe back pain that makes movement difficult. These symptoms may  get worse when you cough, sneeze, or laugh, or when you sit or stand for long periods of time. Being overweight may also make symptoms worse. In some cases, symptoms may recur over time. How is this diagnosed? This condition may be diagnosed based on:  Your symptoms.  A physical exam. Your health care provider may ask you to do certain movements to check whether those movements trigger your symptoms.  You may have tests, including:  Blood tests.  X-rays.  MRI.  CT scan. How is this treated? In many cases, this condition improves on its own, without any treatment. However, treatment may include:  Reducing or modifying physical activity during periods of pain.  Exercising and stretching to strengthen your abdomen and improve the flexibility of your spine.  Icing and applying heat to the affected area.  Medicines that help:  To relieve pain and swelling.  To relax your muscles.  Injections of medicines that help to relieve pain, irritation, and inflammation around the sciatic nerve (steroids).  Surgery. Follow these instructions at home: Medicines   Take over-the-counter and prescription medicines only as told by your health care provider.  Do not drive or operate heavy machinery while taking prescription pain medicine. Managing pain   If directed, apply ice to the affected area.  Put ice in a plastic bag.  Place a towel between your skin and the bag.  Leave the ice on for 20 minutes, 2-3 times a day.  After icing, apply heat to the   affected area before you exercise or as often as told by your health care provider. Use the heat source that your health care provider recommends, such as a moist heat pack or a heating pad.  Place a towel between your skin and the heat source.  Leave the heat on for 20-30 minutes.  Remove the heat if your skin turns bright red. This is especially important if you are unable to feel pain, heat, or cold. You may have a greater risk of  getting burned. Activity   Return to your normal activities as told by your health care provider. Ask your health care provider what activities are safe for you.  Avoid activities that make your symptoms worse.  Take brief periods of rest throughout the day. Resting in a lying or standing position is usually better than sitting to rest.  When you rest for longer periods, mix in some mild activity or stretching between periods of rest. This will help to prevent stiffness and pain.  Avoid sitting for long periods of time without moving. Get up and move around at least one time each hour.  Exercise and stretch regularly, as told by your health care provider.  Do not lift anything that is heavier than 10 lb (4.5 kg) while you have symptoms of sciatica. When you do not have symptoms, you should still avoid heavy lifting, especially repetitive heavy lifting.  When you lift objects, always use proper lifting technique, which includes:  Bending your knees.  Keeping the load close to your body.  Avoiding twisting. General instructions   Use good posture.  Avoid leaning forward while sitting.  Avoid hunching over while standing.  Maintain a healthy weight. Excess weight puts extra stress on your back and makes it difficult to maintain good posture.  Wear supportive, comfortable shoes. Avoid wearing high heels.  Avoid sleeping on a mattress that is too soft or too hard. A mattress that is firm enough to support your back when you sleep may help to reduce your pain.  Keep all follow-up visits as told by your health care provider. This is important. Contact a health care provider if:  You have pain that wakes you up when you are sleeping.  You have pain that gets worse when you lie down.  Your pain is worse than you have experienced in the past.  Your pain lasts longer than 4 weeks.  You experience unexplained weight loss. Get help right away if:  You lose control of your bowel  or bladder (incontinence).  You have:  Weakness in your lower back, pelvis, buttocks, or legs that gets worse.  Redness or swelling of your back.  A burning sensation when you urinate. This information is not intended to replace advice given to you by your health care provider. Make sure you discuss any questions you have with your health care provider. Document Released: 11/17/2001 Document Revised: 04/28/2016 Document Reviewed: 08/02/2015 Elsevier Interactive Patient Education  2017 [employer]  

## 2017-04-20 NOTE — Progress Notes (Signed)
   Subjective:    Patient ID: Debbie Steele, female    DOB: January 21, 1956, 61 y.o.   MRN: [id number]  Hip Pain   The incident occurred 3 to 5 days ago. There was no injury mechanism. Pain location: left gluteal. The quality of the pain is described as aching. The pain is at a severity of 10/10. The pain is moderate. The pain has been intermittent since onset. Pertinent negatives include no numbness or tingling. She reports no foreign bodies present. She has tried acetaminophen and rest for the symptoms. The treatment provided mild relief.      Review of Systems  Musculoskeletal: Positive for arthralgias, back pain and gait problem.  Neurological: Negative for tingling and numbness.  All other systems reviewed and are negative.      Objective:   Physical Exam  Constitutional: She is oriented to person, place, and time. She appears well-developed and well-nourished. No distress.  HENT:  Head: Normocephalic.  Cardiovascular: Normal rate, regular rhythm, normal heart sounds and intact distal pulses.   No murmur heard. Pulmonary/Chest: Effort normal and breath sounds normal. No respiratory distress. She has no wheezes.  Abdominal: Soft. Bowel sounds are normal. She exhibits no distension. There is no tenderness.  Musculoskeletal: Normal range of motion. She exhibits no edema or tenderness.  Pain in left gluteal with external rotation, flexion, and extension  Neurological: She is alert and oriented to person, place, and time.  Skin: Skin is warm and dry.  Psychiatric: She has a normal mood and affect. Her behavior is normal. Judgment and thought content normal.  Vitals reviewed.     BP 120/62   Pulse 70   Temp 98.6 F (37 C) (Oral)   Ht 5\' 7"  (1.702 m)   Wt 230 lb 3.2 oz (104.4 kg)   LMP 04/24/2014   BMI 36.05 kg/m      Assessment & Plan:  1. Sciatic pain, left -Rest Ice and heat ROM exercises discussed Tylenol, pt can not take NSAID's related to hx of GI bleed RTO prn    - predniSONE (STERAPRED UNI-PAK 21 TAB) 10 MG (21) TBPK tablet; Use as directed  Dispense: 21 tablet; Refill: 0   Evelina Dun, FNP

## 2017-04-26 ENCOUNTER — Encounter: Payer: Self-pay | Admitting: Family

## 2017-04-26 ENCOUNTER — Ambulatory Visit (INDEPENDENT_AMBULATORY_CARE_PROVIDER_SITE_OTHER): Payer: Medicare Other

## 2017-04-26 ENCOUNTER — Ambulatory Visit (INDEPENDENT_AMBULATORY_CARE_PROVIDER_SITE_OTHER): Payer: Medicare Other | Admitting: Family

## 2017-04-26 VITALS — BP 123/67 | HR 81 | Temp 98.6°F | Ht 67.0 in | Wt 227.0 lb

## 2017-04-26 DIAGNOSIS — M5432 Sciatica, left side: Secondary | ICD-10-CM

## 2017-04-26 MED ORDER — BACLOFEN 10 MG PO TABS: 10.0000 mg | ORAL_TABLET | Freq: Three times a day (TID) | ORAL | 0 refills | Status: DC

## 2017-04-26 NOTE — Patient Instructions (Signed)
Spondylolisthesis Rehab Ask your health care provider which exercises are safe for you. Do exercises exactly as told by your health care provider and adjust them as directed. It is normal to feel mild stretching, pulling, tightness, or discomfort as you do these exercises, but you should stop right away if you feel sudden pain or your pain gets worse. Do not begin these exercises until told by your health care provider. Stretching and range of motion exercises These exercises warm up your muscles and joints and improve the movement and flexibility of your hips and your back. These exercises may also help to relieve pain, numbness, and tingling. Exercise A: Single knee to chest 1. Lie on your back on a firm surface with both legs straight. 2. Bend one of your knees. Use your hands to move your knee up toward your chest until you feel a gentle stretch in your lower back and buttock.  Hold your leg in this position by holding onto the front of your knee.  Keep your other leg as straight as possible. 3. Hold for __________ seconds. 4. Slowly return to the starting position. 5. Repeat this exercise with your other leg. Repeat __________ times. Complete this exercise __________ times a day. Exercise B: Double knee to chest 1. Lie on your back on a firm surface with both legs straight. 2. Bend one of your knees and move it toward your chest until you feel a gentle stretch in your lower back and buttock. 3. Tense your abdominal muscles and repeat the previous step with your other leg. 4. Hold both of your legs in this position by holding onto the backs of your thighs or the fronts of your knees. 5. Hold for __________ seconds. 6. Tense your abdominal muscles and slowly move your legs back to the floor, one leg at a time. Repeat __________ times. Complete this exercise __________ times a day. Strengthening exercises These exercises build strength and endurance in your back. Endurance is the ability to  use your muscles for a long time, even after they get tired. Exercise C: Pelvic tilt 1. Lie on your back on a firm bed or the floor. Bend your knees and keep your feet flat. 2. Tense your abdominal muscles. Tip your pelvis up toward the ceiling and flatten your lower back into the floor.  To help with this exercise, you may place a small towel under your lower back and try to push your back into the towel. 3. Hold for __________ seconds. 4. Let your muscles relax completely before you repeat this exercise. Repeat __________ times. Complete this exercise __________ times a day. Exercise D: Abdominal crunch 1. Lie on your back on a firm surface. Bend your knees and keep your feet flat. Cross your arms over your chest. 2. Tuck your chin down toward your chest, without bending your neck. 3. Use your abdominal muscles to lift your upper body off of the ground, straight up into the air.  Try to lift yourself until your shoulder blades are off the ground. You may need to work up to this.  Keep your lower back on the ground while you crunch upward.  Do not hold your breath. 4. Slowly lower yourself down. Keep your abdominal muscles tense until you are back to the starting position. Repeat __________ times. Complete this exercise __________ times a day. Exercise E: Alternating arm and leg raises 1. Get on your hands and knees on a firm surface. If you are on a hard floor, you   may want to use padding to cushion your knees, such as an exercise mat. 2. Line up your arms and legs. Your hands should be below your shoulders, and your knees should be below your hips. 3. Lift your left leg behind you. At the same time, raise your right arm and straighten it in front of you.  Do not lift your leg higher than your hip.  Do not lift your arm higher than your shoulder.  Keep your abdominal and back muscles tight.  Keep your hips facing the ground.  Do not arch your back.  Keep your balance carefully,  and do not hold your breath. 4. Hold for __________ seconds. 5. Slowly return to the starting position and repeat with your right leg and your left arm. Repeat __________ times. Complete this exercise __________ times a day. Posture and body mechanics   Body mechanics refers to the movements and positions of your body while you do your daily activities. Posture is part of body mechanics. Good posture and healthy body mechanics can help to relieve stress in your body's tissues and joints. Good posture means that your spine is in its natural S-curve position (your spine is neutral), your shoulders are pulled back slightly, and your head is not tipped forward. The following are general guidelines for applying improved posture and body mechanics to your everyday activities. Standing   When standing, keep your spine neutral and your feet about hip-width apart. Keep a slight bend in your knees. Your ears, shoulders, and hips should line up.  When you do a task in which you stand in one place for a long time, place one foot up on a stable object that is 2-4 inches (5-10 cm) high, such as a footstool. This helps keep your spine neutral. Sitting  When sitting, keep your spine neutral and keep your feet flat on the floor. Use a footrest, if necessary, and keep your thighs parallel to the floor. Avoid rounding your shoulders, and avoid tilting your head forward.  When working at a desk or a computer, keep your desk at a height where your hands are slightly lower than your elbows. Slide your chair under your desk so you are close enough to maintain good posture.  When working at a computer, place your monitor at a height where you are looking straight ahead and you do not have to tilt your head forward or downward to look at the screen. Resting   When lying down and resting, avoid positions that are most painful for you.  If you have pain with activities such as sitting, bending, stooping, or squatting  (flexion-based activities), lie in a position in which your body does not bend very much. For example, avoid curling up on your side with your arms and knees near your chest (fetal position).  If you have pain with activities such as standing for a long time or reaching with your arms (extension-based activities), lie with your spine in a neutral position and bend your knees slightly. Try the following positions:  Lying on your side with a pillow between your knees.  Lying on your back with a pillow under your knees. Lifting   When lifting objects, keep your feet at least shoulder-width apart and tighten your abdominal muscles.  Bend your knees and hips and keep your spine neutral. It is important to lift using the strength of your legs, not your back. Do not lock your knees straight out.  Always ask for help to lift   heavy or awkward objects. This information is not intended to replace advice given to you by your health care provider. Make sure you discuss any questions you have with your health care provider. Document Released: 11/23/2005 Document Revised: 07/30/2016 Document Reviewed: 09/03/2015 Elsevier Interactive Patient Education  2017 [employer]  

## 2017-04-26 NOTE — Progress Notes (Signed)
   Subjective:    Patient ID: Debbie Steele, female    DOB: [birth year], 61 y.o.   MRN: [id number]   Pt presents to the office today with recurrent left hip pain. Pt was seen on 04/20/17 and given prednisone. Pt states she was "feeling good all week until two days after stretching". PT can not tolerated NSAID's related to hx of GI bleed.  Hip Pain   The incident occurred more than 1 week ago. The pain is present in the left hip. The pain is at a severity of 5/10. The pain is moderate. The pain has been intermittent since onset. Pertinent negatives include no numbness or tingling. She reports no foreign bodies present. The symptoms are aggravated by weight bearing. She has tried rest, heat and acetaminophen for the symptoms. The treatment provided mild relief.      Review of Systems  Neurological: Negative for tingling and numbness.  All other systems reviewed and are negative.      Objective:   Physical Exam  Constitutional: She is oriented to person, place, and time. She appears well-developed and well-nourished. No distress.  HENT:  Head: Normocephalic.  Cardiovascular: Normal rate, regular rhythm, normal heart sounds and intact distal pulses.   No murmur heard. Pulmonary/Chest: Effort normal and breath sounds normal. No respiratory distress. She has no wheezes.  Abdominal: Soft. Bowel sounds are normal. She exhibits no distension. There is no tenderness.  Musculoskeletal: She exhibits tenderness. She exhibits no edema.  Pain if left gluteal with external rotation and flexion   Neurological: She is alert and oriented to person, place, and time.  Skin: Skin is warm and dry.  Psychiatric: She has a normal mood and affect. Her behavior is normal. Judgment and thought content normal.  Vitals reviewed.     BP 123/67   Pulse 81   Temp 98.6 F (37 C) (Oral)   Ht 5\' 7"  (1.702 m)   Wt 227 lb (103 kg)   LMP 04/24/2014   BMI 35.55 kg/m      Assessment & Plan:  1. Sciatic pain,  left Rest Ice and heat ROM exercises discussed- Handout given Baclofen started today as needed Continue Tylenol  RTO Prn  - baclofen (LIORESAL) 10 MG tablet; Take 1 tablet (10 mg total) by mouth 3 (three) times daily.  Dispense: 30 each; Refill: 0 - Ambulatory referral to Physical Therapy - DG Lumbar Spine 2-3 Views; Future   Evelina Dun, FNP

## 2017-05-11 ENCOUNTER — Encounter: Payer: Self-pay | Admitting: Physician Assistant

## 2017-05-11 ENCOUNTER — Ambulatory Visit (INDEPENDENT_AMBULATORY_CARE_PROVIDER_SITE_OTHER): Payer: Medicare Other

## 2017-05-11 ENCOUNTER — Ambulatory Visit (INDEPENDENT_AMBULATORY_CARE_PROVIDER_SITE_OTHER): Payer: Medicare Other | Admitting: Physician Assistant

## 2017-05-11 ENCOUNTER — Telehealth: Payer: Self-pay | Admitting: Family Medicine

## 2017-05-11 ENCOUNTER — Other Ambulatory Visit: Payer: Self-pay | Admitting: Physician Assistant

## 2017-05-11 VITALS — BP 132/74 | HR 73 | Temp 97.4°F | Ht 67.0 in | Wt 231.0 lb

## 2017-05-11 DIAGNOSIS — M79641 Pain in right hand: Secondary | ICD-10-CM

## 2017-05-11 DIAGNOSIS — M79642 Pain in left hand: Secondary | ICD-10-CM

## 2017-05-11 MED ORDER — NAPROXEN SODIUM 550 MG PO TABS: 550.0000 mg | ORAL_TABLET | Freq: Two times a day (BID) | ORAL | 1 refills | Status: DC

## 2017-05-11 NOTE — Patient Instructions (Signed)
Rheumatoid Arthritis Rheumatoid arthritis (RA) is a long-term (chronic) disease. RA causes inflammation in your joints. Your joints may feel painful, stiff, swollen, warm, or tender. RA may start slowly. Usually, it affects the small joints of the hands and feet. It can also affect other parts of the body, even the heart, eyes, or lungs. Symptoms of RA often come and go. Sometimes, symptoms get worse for a while. These are called flares. There is no cure for RA, but your doctor will work with you to find the best treatment option for you. This will depend on how the disease is changing in your body. Follow these instructions at home:  Take over-the-counter and prescription medicines only as told by your doctor. Your doctor may change (adjust) your medicines every 3 months.  Start an exercise program as told by your doctor.  Rest when you have a flare.  Return to your normal activities as told by your doctor. Ask your doctor what activities are safe for you.  Keep all follow-up visits as told by your doctor. This is important. Contact a doctor if:  You have a flare.  You have a fever.  You have problems (side effects) because of your medicines. Get help right away if:  You have chest pain.  You have trouble breathing.  You have a hot, painful joint all of a sudden, and it is worse than your usual joint aches. This information is not intended to replace advice given to you by your health care provider. Make sure you discuss any questions you have with your health care provider. Document Released: 02/15/2012 Document Revised: 04/30/2016 Document Reviewed: 09/05/2015 Elsevier Interactive Patient Education  2018 [employer]  

## 2017-05-11 NOTE — Telephone Encounter (Signed)
appt scheduled Pt notified 

## 2017-05-11 NOTE — Progress Notes (Signed)
BP 132/74   Pulse 73   Temp 97.4 F (36.3 C) (Oral)   Ht _0  (1.702 m)   Wt 231 lb (104.8 kg)   LMP 04/24/2014   BMI 36.18 kg/m    Subjective:    Patient ID: Debbie Steele, female    DOB: 1956/07/02, 61 y.o.   MRN: [id number]  HPI: Debbie Steele is a 61 y.o. female presenting on 05/11/2017 for Hand Pain (bilateral pain, 4-5 days, no injury to either one, no numbess or tingling, would like xrays)  Patient has had long-standing hand and joint pain. It starts in the wrist and goes all the way down. She has no loss of strength. She habitually pops her knuckles. She is on that for many years. There is no family history of rheumatoid arthritis. She is not complaining of any other significant joint other than one knee.  Relevant past medical, surgical, family and social history reviewed and updated as indicated. Allergies and medications reviewed and updated.  Past Medical History:  Diagnosis Date  . Anxiety   . Depression   . Hyperlipidemia   . Hypertension     Past Surgical History:  Procedure Laterality Date  . CERVICAL ABLATION    . CHOLECYSTECTOMY    . TUBAL LIGATION      Review of Systems  Constitutional: Negative.   HENT: Negative.   Eyes: Negative.   Respiratory: Negative.   Gastrointestinal: Negative.   Genitourinary: Negative.   Musculoskeletal: Positive for arthralgias and joint swelling.    Allergies as of 05/11/2017      Reactions   Nuedexta [dextromethorphan-quinidine] Hives   Depakene [valproic Acid] Itching, Rash      Medication List       Accurate as of 05/11/17 11:16 AM. Always use your most recent med list.          ALPRAZolam 1 MG tablet Commonly known as:  XANAX Take 1 mg by mouth 2 (two) times daily as needed.   baclofen 10 MG tablet Commonly known as:  LIORESAL Take 1 tablet (10 mg total) by mouth 3 (three) times daily.   carbidopa-levodopa 25-100 MG tablet Commonly known as:  SINEMET IR Take 1 tablet by mouth 2 (two) times  daily.   cloNIDine 0.1 MG tablet Commonly known as:  CATAPRES Take 1 tablet (0.1 mg total) by mouth 3 (three) times daily.   DULoxetine 60 MG capsule Commonly known as:  CYMBALTA Take 60 mg by mouth daily.   furosemide 40 MG tablet Commonly known as:  LASIX TAKE 1 TABLET (40 MG TOTAL) BY MOUTH EVERY MORNING.   glucose blood test strip Check BS QD and PRN   lamoTRIgine 200 MG tablet Commonly known as:  LAMICTAL Take 200 mg by mouth daily.   lisinopril 40 MG tablet Commonly known as:  PRINIVIL,ZESTRIL Take 1 tablet (40 mg total) by mouth daily.   naproxen sodium 550 MG tablet Commonly known as:  ANAPROX DS Take 1 tablet (550 mg total) by mouth 2 (two) times daily with a meal.   primidone 50 MG tablet Commonly known as:  MYSOLINE Take 50 mg by mouth 2 (two) times daily.   simvastatin 40 MG tablet Commonly known as:  ZOCOR TAKE 1 TABLET (40 MG TOTAL) BY MOUTH AT BEDTIME.          Objective:    BP 132/74   Pulse 73   Temp 97.4 F (36.3 C) (Oral)   Ht _1  (1.702 m)  Wt 231 lb (104.8 kg)   LMP 04/24/2014   BMI 36.18 kg/m   Allergies  Allergen Reactions  . Nuedexta [Dextromethorphan-Quinidine] Hives  . Depakene [Valproic Acid] Itching and Rash    Physical Exam  Constitutional: She is oriented to person, place, and time. She appears well-developed and well-nourished.  HENT:  Head: Normocephalic and atraumatic.  Eyes: Conjunctivae and EOM are normal. Pupils are equal, round, and reactive to light.  Cardiovascular: Normal rate, regular rhythm, normal heart sounds and intact distal pulses.   Pulmonary/Chest: Effort normal and breath sounds normal.  Abdominal: Soft. Bowel sounds are normal.  Musculoskeletal:       Right hand: She exhibits decreased range of motion, tenderness and bony tenderness.       Left hand: She exhibits decreased range of motion, tenderness and bony tenderness.  Neurological: She is alert and oriented to person, place, and time. She  has normal reflexes.  Skin: Skin is warm and dry. No rash noted.  Psychiatric: She has a normal mood and affect. Her behavior is normal. Judgment and thought content normal.    Results for orders placed or performed in visit on 03/31/17  CMP14+EGFR  Result Value Ref Range   Glucose 84 65 - 99 mg/dL   BUN 11 6 - 24 mg/dL   Creatinine, Ser 0.86 0.57 - 1.00 mg/dL   GFR calc non Af Amer 75 >59 mL/min/1.73   GFR calc Af Amer 86 >59 mL/min/1.73   BUN/Creatinine Ratio 13 9 - 23   Sodium 139 134 - 144 mmol/L   Potassium 4.9 3.5 - 5.2 mmol/L   Chloride 99 96 - 106 mmol/L   CO2 28 18 - 29 mmol/L   Calcium 9.6 8.7 - 10.2 mg/dL   Total Protein 6.3 6.0 - 8.5 g/dL   Albumin 4.0 3.5 - 5.5 g/dL   Globulin, Total 2.3 1.5 - 4.5 g/dL   Albumin/Globulin Ratio 1.7 1.2 - 2.2   Bilirubin Total 0.3 0.0 - 1.2 mg/dL   Alkaline Phosphatase 78 39 - 117 IU/L   AST 19 0 - 40 IU/L   ALT 17 0 - 32 IU/L  Lipid panel  Result Value Ref Range   Cholesterol, Total 145 100 - 199 mg/dL   Triglycerides 172 (H) 0 - 149 mg/dL   HDL 47 >39 mg/dL   VLDL Cholesterol Cal 34 5 - 40 mg/dL   LDL Calculated 64 0 - 99 mg/dL   Chol/HDL Ratio 3.1 0.0 - 4.4 ratio  HIV antibody  Result Value Ref Range   HIV Screen 4th Generation wRfx Non Reactive Non Reactive  Hepatitis C antibody  Result Value Ref Range   Hep C Virus Ab <0.1 0.0 - 0.9 s/co ratio      Assessment & Plan:   1. Pain of left hand - naproxen sodium (ANAPROX DS) 550 MG tablet; Take 1 tablet (550 mg total) by mouth 2 (two) times daily with a meal.  Dispense: 60 tablet; Refill: 1 - Rheumatoid Arthritis Profile  2. Right hand pain - DG Hand Complete Right; Future - naproxen sodium (ANAPROX DS) 550 MG tablet; Take 1 tablet (550 mg total) by mouth 2 (two) times daily with a meal.  Dispense: 60 tablet; Refill: 1 - Rheumatoid Arthritis Profile   Current Outpatient Prescriptions:  .  ALPRAZolam (XANAX) 1 MG tablet, Take 1 mg by mouth 2 (two) times daily as  needed. , Disp: , Rfl:  .  baclofen (LIORESAL) 10 MG tablet, Take 1 tablet (10  mg total) by mouth 3 (three) times daily., Disp: 30 each, Rfl: 0 .  carbidopa-levodopa (SINEMET IR) 25-100 MG per tablet, Take 1 tablet by mouth 2 (two) times daily. , Disp: , Rfl:  .  cloNIDine (CATAPRES) 0.1 MG tablet, Take 1 tablet (0.1 mg total) by mouth 3 (three) times daily. (Patient taking differently: Take 0.1 mg by mouth at bedtime. ), Disp: 90 tablet, Rfl: 11 .  DULoxetine (CYMBALTA) 60 MG capsule, Take 60 mg by mouth daily., Disp: , Rfl:  .  furosemide (LASIX) 40 MG tablet, TAKE 1 TABLET (40 MG TOTAL) BY MOUTH EVERY MORNING. (Patient taking differently: TAKE 1 TABLET (40 MG TOTAL) BY MOUTH EVERY MORNING as needed), Disp: 30 tablet, Rfl: 4 .  glucose blood test strip, Check BS QD and PRN, Disp: 100 each, Rfl: 11 .  lamoTRIgine (LAMICTAL) 200 MG tablet, Take 200 mg by mouth daily., Disp: , Rfl:  .  lisinopril (PRINIVIL,ZESTRIL) 40 MG tablet, Take 1 tablet (40 mg total) by mouth daily., Disp: 90 tablet, Rfl: 1 .  naproxen sodium (ANAPROX DS) 550 MG tablet, Take 1 tablet (550 mg total) by mouth 2 (two) times daily with a meal., Disp: 60 tablet, Rfl: 1 .  primidone (MYSOLINE) 50 MG tablet, Take 50 mg by mouth 2 (two) times daily. , Disp: , Rfl:  .  simvastatin (ZOCOR) 40 MG tablet, TAKE 1 TABLET (40 MG TOTAL) BY MOUTH AT BEDTIME., Disp: 90 tablet, Rfl: 1  Continue all other maintenance medications as listed above.  Follow up plan: Return if symptoms worsen or fail to improve.  Educational handout given for rheumatoid arthritis  Terald Sleeper PA-C [facility] [address]  [place], [place] [address] [phone]   05/11/2017, 11:16 AM

## 2017-05-12 ENCOUNTER — Telehealth: Payer: Self-pay | Admitting: Family Medicine

## 2017-05-12 LAB — SPECIMEN STATUS

## 2017-05-12 LAB — RHEUMATOID ARTHRITIS PROFILE
Cyclic Citrullin Peptide Ab: 7 units (ref 0–19)
Rhuematoid fact SerPl-aCnc: <10 IU/mL (ref 0.0–13.9)

## 2017-05-12 NOTE — Telephone Encounter (Signed)
Pt aware of x-ray

## 2017-05-24 DIAGNOSIS — R9431 Abnormal electrocardiogram [ECG] [EKG]: Secondary | ICD-10-CM | POA: Diagnosis not present

## 2017-05-24 DIAGNOSIS — Z888 Allergy status to other drugs, medicaments and biological substances status: Secondary | ICD-10-CM | POA: Diagnosis not present

## 2017-05-24 DIAGNOSIS — N390 Urinary tract infection, site not specified: Secondary | ICD-10-CM | POA: Diagnosis not present

## 2017-05-24 DIAGNOSIS — I10 Essential (primary) hypertension: Secondary | ICD-10-CM | POA: Diagnosis not present

## 2017-05-24 DIAGNOSIS — R5383 Other fatigue: Secondary | ICD-10-CM | POA: Diagnosis not present

## 2017-05-31 ENCOUNTER — Encounter: Payer: Self-pay | Admitting: Family Medicine

## 2017-05-31 ENCOUNTER — Ambulatory Visit (INDEPENDENT_AMBULATORY_CARE_PROVIDER_SITE_OTHER): Payer: Medicare Other | Admitting: Family Medicine

## 2017-05-31 VITALS — BP 161/81 | HR 98 | Temp 98.3°F | Ht 67.0 in | Wt 226.0 lb

## 2017-05-31 DIAGNOSIS — F3171 Bipolar disorder, in partial remission, most recent episode hypomanic: Secondary | ICD-10-CM | POA: Diagnosis not present

## 2017-05-31 DIAGNOSIS — J029 Acute pharyngitis, unspecified: Secondary | ICD-10-CM | POA: Diagnosis not present

## 2017-05-31 LAB — CULTURE, GROUP A STREP

## 2017-05-31 LAB — RAPID STREP SCREEN (MED CTR MEBANE ONLY): Strep Gp A Ag, IA W/Reflex: NEGATIVE

## 2017-05-31 MED ORDER — AMOXICILLIN 500 MG PO CAPS: 500.0000 mg | ORAL_CAPSULE | Freq: Two times a day (BID) | ORAL | 0 refills | Status: DC

## 2017-05-31 NOTE — Patient Instructions (Signed)
Great to see you!   Start amoxicillin, finish all pills.   Come back or call with any concerns

## 2017-05-31 NOTE — Progress Notes (Signed)
   HPI  Patient presents today here with sore throat and bipolar disorder.  Bipolar Patient has a recent hospitalization, released from the hospital 2 days ago, from inpatient psychiatric suicidal ideation. She is still having frequent suicidal thoughts, however states that she does not want to hurt herself. She contracts for safety. She has follow-up with her psychiatrist in 2 days. She is very happy with her current medication.  Patient describes sore throat for 6 days, she denies chills or sweats. She has had subjective fever she's tolerating food and fluids as usual. No cough or shortness of breath.   PMH: Smoking status noted ROS: Per HPI  Objective: BP (!) 161/81   Pulse 98   Temp 98.3 F (36.8 C) (Oral)   Ht 5\' 7"  (1.702 m)   Wt 226 lb (102.5 kg)   LMP 04/24/2014   BMI 35.40 kg/m  Gen: NAD, alert, cooperative with exam HEENT: NCAT,  TMs normal bilaterally, oropharynx erythematous with white patches posteriorly, nares erythematous CV: RRR, good S1/S2, no murmur Resp: CTABL, no wheezes, non-labored Ext: No edema, warm Neuro: Alert and oriented, No gross deficits  Depression screen [facility] 2/9 05/31/2017 05/11/2017 04/26/2017 04/20/2017 03/31/2017  Decreased Interest 3 0 0 0 0  Down, Depressed, Hopeless 3 0 0 0 0  PHQ - 2 Score 6 0 0 0 0  Altered sleeping 3 - - - -  Tired, decreased energy 0 - - - -  Change in appetite 3 - - - -  Feeling bad or failure about yourself  3 - - - -  Trouble concentrating 3 - - - -  Moving slowly or fidgety/restless 3 - - - -  Suicidal thoughts 3 - - - -  PHQ-9 Score 24 - - - -     Assessment and plan:  # Sore throat Given hospitalization and persistent sore throat with white patches present I treated her for strep pharyngitis, also consider thrush, however this is not a very characteristic finding of thrush. Amoxicillin Rapid strep negative, strep culture pending  # Bipolar disorder Improving, patient doing better with medications She  has frequent suicidal thoughts but states that she does not intend to hurt herself and contracts for safety. She was just released from medical help and has follow-up with psychiatry in 2 days    Orders Placed This Encounter  Procedures  . Rapid strep screen (not at [facility])  . Culture, Group A Strep    Order Specific Question:   Source    Answer:   throat    Meds ordered this encounter  Medications  . Lurasidone HCl 60 MG TABS    Sig: Take 1 tablet by mouth daily.  . temazepam (RESTORIL) 15 MG capsule    Sig: Take 1 capsule by mouth at bedtime.  Marland Kitchen amoxicillin (AMOXIL) 500 MG capsule    Sig: Take 1 capsule (500 mg total) by mouth 2 (two) times daily.    Dispense:  20 capsule    Refill:  Olga, MD [facility] 05/31/2017, 9:56 AM

## 2017-06-03 LAB — CULTURE, GROUP A STREP: Strep A Culture: NEGATIVE

## 2017-06-10 ENCOUNTER — Ambulatory Visit (INDEPENDENT_AMBULATORY_CARE_PROVIDER_SITE_OTHER): Payer: Medicare Other | Admitting: Pediatrics

## 2017-06-10 ENCOUNTER — Encounter: Payer: Self-pay | Admitting: Pediatrics

## 2017-06-10 VITALS — BP 131/80 | HR 88 | Temp 98.3°F | Ht 67.0 in | Wt 231.8 lb

## 2017-06-10 DIAGNOSIS — K146 Glossodynia: Secondary | ICD-10-CM | POA: Diagnosis not present

## 2017-06-10 DIAGNOSIS — R7303 Prediabetes: Secondary | ICD-10-CM

## 2017-06-10 LAB — BAYER DCA HB A1C WAIVED: HB A1C (BAYER DCA - WAIVED): 6.3 % (ref <7.0)

## 2017-06-10 MED ORDER — NYSTATIN 100000 UNIT/ML MT SUSP: 5.0000 mL | Freq: Four times a day (QID) | OROMUCOSAL | 0 refills | Status: DC

## 2017-06-10 NOTE — Progress Notes (Signed)
  Subjective:   Patient ID: Debbie Steele, female    DOB: [birth year], 61 y.o.   MRN: [id number] CC: burning mouth HPI: Debbie Steele is a 61 y.o. female presenting for burning mouth  Started with sore throat around 6/21 At first pt thought it was latuda that was causing a sore throat  Seen here 6/25, had white patches in back of mouth, treated with amoxicillin for strep throat  Sore throat got better with amoxillicin but then started having burning in the front of mouth, culture negative  Now with constant burning in tongue and inside of lips Taking numbing throat drops helps some   No h/o herpes No lesion in the front of her mouth  Wears upper and lower dentures, not able to remove today but says she has not seen any lesions on mucosa below dentures  Doesn't use inhalers Doesn't check BGLs at home  On steroids in 04/2017 for her back  Relevant past medical, surgical, family and social history reviewed. Allergies and medications reviewed and updated. History  Smoking Status  . Current Every Day Smoker  . Types: E-cigarettes  Smokeless Tobacco  . Never Used    Comment: E-Cigs   ROS: Per HPI   Objective:    BP 131/80   Pulse 88   Temp 98.3 F (36.8 C) (Oral)   Ht 5\' 7"  (1.702 m)   Wt 231 lb 12.8 oz (105.1 kg)   LMP 04/24/2014   BMI 36.31 kg/m   Wt Readings from Last 3 Encounters:  06/10/17 231 lb 12.8 oz (105.1 kg)  05/31/17 226 lb (102.5 kg)  05/11/17 231 lb (104.8 kg)    Gen: NAD, alert, cooperative with exam, NCAT EYES: EOMI, no conjunctival injection, or no icterus ENT:  TMs pearly gray b/l, OP without erythema, exudates, no lesions or patches LYMPH: no cervical LAD  CV: NRRR, normal S1/S2, no murmur, distal pulses 2+ b/l Resp: CTABL, no wheezes, normal WOB Abd: +BS, soft, NTND. Ext: No edema, warm Neuro: Alert and oriented  Assessment & Plan:  Maelani was seen today for burning mouth.  Diagnoses and all orders for this visit:  Pre-diabetes -      Bayer DCA Hb A1c Waived  Burning mouth syndrome Normal exam Will treat with below for possible thrush Recheck BGLs as above Any new symptoms needs to be seen If symptoms continue will need additional labs including TSH, selenium, zinc, B12, vit D to rule out vitamin cause of BMS Discussed more common in menopausal women, usually goes away but can take weeks to months. -     nystatin (MYCOSTATIN) 100000 UNIT/ML suspension; Take 5 mLs (500,000 Units total) by mouth 4 (four) times daily.   Follow up plan: As scheduled Assunta Found, MD [place]

## 2017-06-10 NOTE — Patient Instructions (Addendum)
If not improving over next couple of weeks, call to set up appointment sooner and we can get blood work  Take multivitamin daily

## 2017-09-30 ENCOUNTER — Ambulatory Visit (INDEPENDENT_AMBULATORY_CARE_PROVIDER_SITE_OTHER): Payer: Medicare Other | Admitting: Family Medicine

## 2017-09-30 ENCOUNTER — Encounter: Payer: Self-pay | Admitting: Family Medicine

## 2017-09-30 VITALS — BP 137/68 | HR 62 | Temp 97.7°F | Ht 67.0 in | Wt 248.0 lb

## 2017-09-30 DIAGNOSIS — E785 Hyperlipidemia, unspecified: Secondary | ICD-10-CM

## 2017-09-30 DIAGNOSIS — I10 Essential (primary) hypertension: Secondary | ICD-10-CM | POA: Diagnosis not present

## 2017-09-30 DIAGNOSIS — R7303 Prediabetes: Secondary | ICD-10-CM

## 2017-09-30 LAB — BAYER DCA HB A1C WAIVED: HB A1C: 6.3 % (ref <7.0)

## 2017-09-30 MED ORDER — LISINOPRIL 40 MG PO TABS: 40.0000 mg | ORAL_TABLET | Freq: Every day | ORAL | 1 refills | Status: DC

## 2017-09-30 MED ORDER — SIMVASTATIN 40 MG PO TABS: ORAL_TABLET | ORAL | 1 refills | Status: AC

## 2017-09-30 NOTE — Progress Notes (Signed)
BP 137/68   Pulse 62   Temp 97.7 F (36.5 C) (Oral)   Ht 5' 7"  (1.702 m)   Wt 248 lb (112.5 kg)   LMP 04/24/2014   BMI 38.84 kg/m    Subjective:    Patient ID: Debbie Steele, female    DOB: Jun 03, 1956, 61 y.o.   MRN: [id number]  HPI: Debbie Steele is a 61 y.o. female presenting on 09/30/2017 for Hypertension (pt here today for routine follow up of her chronic medical conditions and also c/o knee and back pain.)   HPI Hypertension Patient is currently on clonidine and lisinopril, and their blood pressure today is 137/68. Patient denies any lightheadedness or dizziness. Patient denies headaches, blurred vision, chest pains, shortness of breath, or weakness. Denies any side effects from medication and is content with current medication.   Hyperlipidemia Patient is coming in for recheck of his hyperlipidemia. The patient is currently taking simvastatin. They deny any issues with myalgias or history of liver damage from it. They deny any focal numbness or weakness or chest pain.   Prediabetes Patient comes in today for recheck of his diabetes. Patient has been currently taking dietary control, patient has gained some weight recently so there is some concern that it may have gotten up, we will recheck it today.  Her last A1c was 6.3 and the previous was 5.9.  She is also had some adjustment in her bipolar medications which may be playing a factor in this. Patient is currently on an ACE inhibitor/ARB. Patient has not seen an ophthalmologist this year. Patient denies any issues with their feet.   Relevant past medical, surgical, family and social history reviewed and updated as indicated. Interim medical history since our last visit reviewed. Allergies and medications reviewed and updated.  Review of Systems  Constitutional: Negative for chills and fever.  Eyes: Negative for visual disturbance.  Respiratory: Negative for chest tightness and shortness of breath.   Cardiovascular:  Negative for chest pain and leg swelling.  Genitourinary: Negative for difficulty urinating and dysuria.  Musculoskeletal: Negative for back pain and gait problem.  Skin: Negative for rash.  Neurological: Negative for dizziness, light-headedness and headaches.  Psychiatric/Behavioral: Negative for agitation and behavioral problems.  All other systems reviewed and are negative.   Per HPI unless specifically indicated above        Objective:    BP 137/68   Pulse 62   Temp 97.7 F (36.5 C) (Oral)   Ht 5' 7"  (1.702 m)   Wt 248 lb (112.5 kg)   LMP 04/24/2014   BMI 38.84 kg/m   Wt Readings from Last 3 Encounters:  09/30/17 248 lb (112.5 kg)  06/10/17 231 lb 12.8 oz (105.1 kg)  05/31/17 226 lb (102.5 kg)    Physical Exam  Constitutional: She is oriented to person, place, and time. She appears well-developed and well-nourished. No distress.  Eyes: Conjunctivae are normal.  Neck: Neck supple. No thyromegaly present.  Cardiovascular: Normal rate, regular rhythm, normal heart sounds and intact distal pulses.   No murmur heard. Pulmonary/Chest: Effort normal and breath sounds normal. No respiratory distress. She has no wheezes. She has no rales.  Musculoskeletal: Normal range of motion. She exhibits no edema.  Lymphadenopathy:    She has no cervical adenopathy.  Neurological: She is alert and oriented to person, place, and time. Coordination normal.  Skin: Skin is warm and dry. No rash noted. She is not diaphoretic.  Psychiatric: She has a normal mood  and affect. Her behavior is normal.  Nursing note and vitals reviewed.   Results for orders placed or performed in visit on 06/10/17  Bayer DCA Hb A1c Waived  Result Value Ref Range   Bayer DCA Hb A1c Waived 6.3 <7.0 %      Assessment & Plan:   Problem List Items Addressed This Visit      Cardiovascular and Mediastinum   Essential hypertension, benign - Primary   Relevant Medications   simvastatin (ZOCOR) 40 MG tablet    lisinopril (PRINIVIL,ZESTRIL) 40 MG tablet   Other Relevant Orders   CMP14+EGFR     Other   Hyperlipidemia LDL goal <130   Relevant Medications   simvastatin (ZOCOR) 40 MG tablet   lisinopril (PRINIVIL,ZESTRIL) 40 MG tablet   Other Relevant Orders   Lipid panel   Morbid obesity ([place])   Prediabetes   Relevant Orders   Bayer DCA Hb A1c Waived       Follow up plan: Return in about 3 months (around 12/31/2017), or if symptoms worsen or fail to improve, for Recheck hypertension and prediabetes.  Counseling provided for all of the vaccine components Orders Placed This Encounter  Procedures  . Bayer DCA Hb A1c Waived  . CMP14+EGFR  . Lipid panel    Caryl Pina, MD [facility] 09/30/2017, 9:28 AM

## 2017-10-01 LAB — LIPID PANEL
CHOLESTEROL TOTAL: 151 mg/dL (ref 100–199)
Chol/HDL Ratio: 2.8 ratio (ref 0.0–4.4)
HDL: 54 mg/dL (ref >39)
LDL Calculated: 79 mg/dL (ref 0–99)
TRIGLYCERIDES: 88 mg/dL (ref 0–149)
VLDL Cholesterol Cal: 18 mg/dL (ref 5–40)

## 2017-10-01 LAB — CMP14+EGFR
A/G RATIO: 1.6 (ref 1.2–2.2)
ALK PHOS: 70 IU/L (ref 39–117)
ALT: 11 IU/L (ref 0–32)
AST: 20 IU/L (ref 0–40)
Albumin: 4.1 g/dL (ref 3.5–5.5)
BILIRUBIN TOTAL: 0.4 mg/dL (ref 0.0–1.2)
BUN/Creatinine Ratio: 7 — ABNORMAL LOW (ref 9–23)
BUN: 6 mg/dL (ref 6–24)
CALCIUM: 9.7 mg/dL (ref 8.7–10.2)
CHLORIDE: 105 mmol/L (ref 96–106)
CO2: 26 mmol/L (ref 20–29)
Creatinine, Ser: 0.89 mg/dL (ref 0.57–1.00)
GFR calc Af Amer: 82 mL/min/{1.73_m2} (ref >59)
GFR, EST NON AFRICAN AMERICAN: 71 mL/min/{1.73_m2} (ref >59)
GLOBULIN, TOTAL: 2.6 g/dL (ref 1.5–4.5)
Glucose: 93 mg/dL (ref 65–99)
POTASSIUM: 5.6 mmol/L — AB (ref 3.5–5.2)
SODIUM: 145 mmol/L — AB (ref 134–144)
Total Protein: 6.7 g/dL (ref 6.0–8.5)

## 2017-11-01 ENCOUNTER — Ambulatory Visit: Payer: Medicare Other | Admitting: Pediatrics

## 2017-11-02 ENCOUNTER — Other Ambulatory Visit: Payer: Self-pay | Admitting: *Deleted

## 2017-11-02 MED ORDER — ONETOUCH ULTRASOFT LANCETS MISC: 0 refills | Status: DC

## 2017-11-04 ENCOUNTER — Encounter (INDEPENDENT_AMBULATORY_CARE_PROVIDER_SITE_OTHER): Payer: Self-pay

## 2017-11-04 ENCOUNTER — Ambulatory Visit (INDEPENDENT_AMBULATORY_CARE_PROVIDER_SITE_OTHER): Payer: Medicare Other

## 2017-11-04 ENCOUNTER — Encounter: Payer: Self-pay | Admitting: Pediatrics

## 2017-11-04 ENCOUNTER — Ambulatory Visit (INDEPENDENT_AMBULATORY_CARE_PROVIDER_SITE_OTHER): Payer: Medicare Other | Admitting: Pediatrics

## 2017-11-04 VITALS — BP 136/76 | HR 81 | Temp 98.3°F | Ht 67.0 in | Wt 249.4 lb

## 2017-11-04 DIAGNOSIS — M25551 Pain in right hip: Secondary | ICD-10-CM

## 2017-11-04 DIAGNOSIS — G8929 Other chronic pain: Secondary | ICD-10-CM

## 2017-11-04 DIAGNOSIS — M25561 Pain in right knee: Secondary | ICD-10-CM

## 2017-11-04 DIAGNOSIS — I10 Essential (primary) hypertension: Secondary | ICD-10-CM

## 2017-11-04 DIAGNOSIS — E875 Hyperkalemia: Secondary | ICD-10-CM

## 2017-11-04 DIAGNOSIS — M25461 Effusion, right knee: Secondary | ICD-10-CM | POA: Diagnosis not present

## 2017-11-04 MED ORDER — AMLODIPINE BESYLATE 5 MG PO TABS: 5.0000 mg | ORAL_TABLET | Freq: Every day | ORAL | 3 refills | Status: DC

## 2017-11-04 NOTE — Progress Notes (Signed)
  Subjective:   Patient ID: Debbie Steele, female    DOB: 1956/01/21, 61 y.o.   MRN: [id number] CC: Knee Pain; Hip Pain; and Back Pain  HPI: Debbie Steele is a 61 y.o. female presenting for Knee Pain; Hip Pain; and Back Pain  Has had back pain off and on for years Has had more pain over the last couple of weeks Pain shoots down her left leg at times Both of her knees have been bothering her more Especially when trying to get up from sitting such as getting up from the toilet Has had knee injections in her left knee in the past, now it is her right knee that is bothering her the most Has had GI bleeds in the past, told to avoid NSAIDs Has history of prediabetes Drinks one regular soda, [facility], every morning Open to decreasing soda intake  Hyperkalemia: on ACE-i  Hypertension: Takes clonidine once a day at night because it makes her sleepy Taking lisinopril 40 mg daily  Relevant past medical, surgical, family and social history reviewed. Allergies and medications reviewed and updated. Social History   Tobacco Use  Smoking Status Current Every Day Smoker  . Types: E-cigarettes  Smokeless Tobacco Never Used  Tobacco Comment   E-Cigs   ROS: Per HPI   Objective:    BP 136/76   Pulse 81   Temp 98.3 F (36.8 C) (Oral)   Ht 5\' 7"  (1.702 m)   Wt 249 lb 6.4 oz (113.1 kg)   LMP 04/24/2014   BMI 39.06 kg/m   Wt Readings from Last 3 Encounters:  11/04/17 249 lb 6.4 oz (113.1 kg)  09/30/17 248 lb (112.5 kg)  06/10/17 231 lb 12.8 oz (105.1 kg)    Gen: NAD, alert, cooperative with exam, NCAT EYES: EOMI, no conjunctival injection, or no icterus LYMPH: no cervical LAD CV: NRRR, normal S1/S2 Resp: CTABL, no wheezes, normal WOB Ext: No edema, warm Neuro: Alert and oriented, strength equal b/l UE and LE, coordination grossly normal MSK: No point tenderness over spine or paraspinal muscles Normal range of motion bilateral knees, crepitus right knee No joint line  tenderness bilateral knees Decreased internal and external rotation of right hip, normal range of motion left hip  Assessment & Plan:  Debbie Steele was seen today for knee pain, hip pain.  Diagnoses and all orders for this visit:  Pain of right hip joint -     DG HIP UNILAT W OR W/O PELVIS 2-3 VIEWS RIGHT; Future -     Ambulatory referral to Orthopedic Surgery  Chronic pain of right knee Topical Aspercreme as needed -     DG Knee 1-2 Views Right; Future -     Ambulatory referral to Orthopedic Surgery  Essential hypertension Check BPs at home, goal 120s/70s Follow-up 4 weeks -     Basic Metabolic Panel -     amLODipine (NORVASC) 5 MG tablet; Take 1 tablet (5 mg total) by mouth daily.  Hyperkalemia On ACE inhibitor, stop ACE-I start amlodipine for BP control  Follow up plan: Return in about 4 weeks (around 12/02/2017) for BP recheck. Assunta Found, MD [place]

## 2017-12-08 ENCOUNTER — Ambulatory Visit: Payer: Medicare Other | Admitting: Pediatrics

## 2017-12-31 ENCOUNTER — Ambulatory Visit: Payer: Medicare Other | Admitting: Family Medicine

## 2019-12-15 ENCOUNTER — Inpatient Hospital Stay (HOSPITAL_COMMUNITY)
Admission: EM | Admit: 2019-12-15 | Discharge: 2019-12-26 | DRG: 917 | Disposition: A | Discharge status: Other Acute Inpatient Hospital | Payer: Medicare Other | Attending: Internal Medicine | Admitting: Internal Medicine

## 2019-12-15 ENCOUNTER — Other Ambulatory Visit: Payer: Self-pay

## 2019-12-15 ENCOUNTER — Emergency Department (HOSPITAL_COMMUNITY): Payer: Medicare Other

## 2019-12-15 ENCOUNTER — Inpatient Hospital Stay (HOSPITAL_COMMUNITY): Payer: Medicare Other

## 2019-12-15 ENCOUNTER — Encounter (HOSPITAL_COMMUNITY): Payer: Self-pay | Admitting: Emergency Medicine

## 2019-12-15 DIAGNOSIS — R4689 Other symptoms and signs involving appearance and behavior: Secondary | ICD-10-CM | POA: Diagnosis not present

## 2019-12-15 DIAGNOSIS — F319 Bipolar disorder, unspecified: Secondary | ICD-10-CM | POA: Diagnosis present

## 2019-12-15 DIAGNOSIS — T50901A Poisoning by unspecified drugs, medicaments and biological substances, accidental (unintentional), initial encounter: Secondary | ICD-10-CM | POA: Diagnosis not present

## 2019-12-15 DIAGNOSIS — Z79899 Other long term (current) drug therapy: Secondary | ICD-10-CM | POA: Diagnosis not present

## 2019-12-15 DIAGNOSIS — R680 Hypothermia, not associated with low environmental temperature: Secondary | ICD-10-CM | POA: Diagnosis not present

## 2019-12-15 DIAGNOSIS — R55 Syncope and collapse: Secondary | ICD-10-CM | POA: Diagnosis not present

## 2019-12-15 DIAGNOSIS — T424X2A Poisoning by benzodiazepines, intentional self-harm, initial encounter: Principal | ICD-10-CM | POA: Diagnosis present

## 2019-12-15 DIAGNOSIS — F13239 Sedative, hypnotic or anxiolytic dependence with withdrawal, unspecified: Secondary | ICD-10-CM | POA: Diagnosis present

## 2019-12-15 DIAGNOSIS — I9589 Other hypotension: Secondary | ICD-10-CM | POA: Diagnosis not present

## 2019-12-15 DIAGNOSIS — Z452 Encounter for adjustment and management of vascular access device: Secondary | ICD-10-CM | POA: Diagnosis not present

## 2019-12-15 DIAGNOSIS — G92 Toxic encephalopathy: Secondary | ICD-10-CM | POA: Diagnosis not present

## 2019-12-15 DIAGNOSIS — T50902D Poisoning by unspecified drugs, medicaments and biological substances, intentional self-harm, subsequent encounter: Secondary | ICD-10-CM | POA: Diagnosis not present

## 2019-12-15 DIAGNOSIS — Z4682 Encounter for fitting and adjustment of non-vascular catheter: Secondary | ICD-10-CM | POA: Diagnosis not present

## 2019-12-15 DIAGNOSIS — J9600 Acute respiratory failure, unspecified whether with hypoxia or hypercapnia: Secondary | ICD-10-CM | POA: Diagnosis present

## 2019-12-15 DIAGNOSIS — J9690 Respiratory failure, unspecified, unspecified whether with hypoxia or hypercapnia: Secondary | ICD-10-CM

## 2019-12-15 DIAGNOSIS — Z915 Personal history of self-harm: Secondary | ICD-10-CM | POA: Diagnosis not present

## 2019-12-15 DIAGNOSIS — E876 Hypokalemia: Secondary | ICD-10-CM | POA: Diagnosis not present

## 2019-12-15 DIAGNOSIS — G929 Unspecified toxic encephalopathy: Secondary | ICD-10-CM

## 2019-12-15 DIAGNOSIS — R404 Transient alteration of awareness: Secondary | ICD-10-CM | POA: Diagnosis not present

## 2019-12-15 DIAGNOSIS — Z7984 Long term (current) use of oral hypoglycemic drugs: Secondary | ICD-10-CM

## 2019-12-15 DIAGNOSIS — R4182 Altered mental status, unspecified: Secondary | ICD-10-CM | POA: Diagnosis present

## 2019-12-15 DIAGNOSIS — M6282 Rhabdomyolysis: Secondary | ICD-10-CM

## 2019-12-15 DIAGNOSIS — Z20822 Contact with and (suspected) exposure to covid-19: Secondary | ICD-10-CM | POA: Diagnosis not present

## 2019-12-15 DIAGNOSIS — I213 ST elevation (STEMI) myocardial infarction of unspecified site: Secondary | ICD-10-CM | POA: Diagnosis not present

## 2019-12-15 DIAGNOSIS — Z743 Need for continuous supervision: Secondary | ICD-10-CM | POA: Diagnosis not present

## 2019-12-15 DIAGNOSIS — F17290 Nicotine dependence, other tobacco product, uncomplicated: Secondary | ICD-10-CM | POA: Diagnosis not present

## 2019-12-15 DIAGNOSIS — E119 Type 2 diabetes mellitus without complications: Secondary | ICD-10-CM | POA: Diagnosis not present

## 2019-12-15 DIAGNOSIS — Z23 Encounter for immunization: Secondary | ICD-10-CM | POA: Diagnosis not present

## 2019-12-15 DIAGNOSIS — I499 Cardiac arrhythmia, unspecified: Secondary | ICD-10-CM | POA: Diagnosis not present

## 2019-12-15 DIAGNOSIS — E785 Hyperlipidemia, unspecified: Secondary | ICD-10-CM | POA: Diagnosis not present

## 2019-12-15 DIAGNOSIS — I959 Hypotension, unspecified: Secondary | ICD-10-CM | POA: Diagnosis not present

## 2019-12-15 DIAGNOSIS — T426X2A Poisoning by other antiepileptic and sedative-hypnotic drugs, intentional self-harm, initial encounter: Secondary | ICD-10-CM | POA: Diagnosis not present

## 2019-12-15 DIAGNOSIS — J9601 Acute respiratory failure with hypoxia: Secondary | ICD-10-CM | POA: Diagnosis present

## 2019-12-15 DIAGNOSIS — T50902A Poisoning by unspecified drugs, medicaments and biological substances, intentional self-harm, initial encounter: Secondary | ICD-10-CM | POA: Diagnosis not present

## 2019-12-15 DIAGNOSIS — R7303 Prediabetes: Secondary | ICD-10-CM | POA: Diagnosis present

## 2019-12-15 DIAGNOSIS — I455 Other specified heart block: Secondary | ICD-10-CM

## 2019-12-15 DIAGNOSIS — G20 Parkinson's disease: Secondary | ICD-10-CM | POA: Diagnosis not present

## 2019-12-15 DIAGNOSIS — J40 Bronchitis, not specified as acute or chronic: Secondary | ICD-10-CM | POA: Diagnosis not present

## 2019-12-15 DIAGNOSIS — I10 Essential (primary) hypertension: Secondary | ICD-10-CM | POA: Diagnosis not present

## 2019-12-15 DIAGNOSIS — T50904A Poisoning by unspecified drugs, medicaments and biological substances, undetermined, initial encounter: Secondary | ICD-10-CM | POA: Diagnosis not present

## 2019-12-15 DIAGNOSIS — J189 Pneumonia, unspecified organism: Secondary | ICD-10-CM

## 2019-12-15 DIAGNOSIS — F3171 Bipolar disorder, in partial remission, most recent episode hypomanic: Secondary | ICD-10-CM | POA: Diagnosis not present

## 2019-12-15 DIAGNOSIS — I480 Paroxysmal atrial fibrillation: Secondary | ICD-10-CM | POA: Diagnosis not present

## 2019-12-15 DIAGNOSIS — T300 Burn of unspecified body region, unspecified degree: Secondary | ICD-10-CM | POA: Diagnosis not present

## 2019-12-15 DIAGNOSIS — T796XXD Traumatic ischemia of muscle, subsequent encounter: Secondary | ICD-10-CM | POA: Diagnosis not present

## 2019-12-15 DIAGNOSIS — G20C Parkinsonism, unspecified: Secondary | ICD-10-CM | POA: Diagnosis present

## 2019-12-15 DIAGNOSIS — X16XXXA Contact with hot heating appliances, radiators and pipes, initial encounter: Secondary | ICD-10-CM | POA: Diagnosis present

## 2019-12-15 DIAGNOSIS — I447 Left bundle-branch block, unspecified: Secondary | ICD-10-CM | POA: Diagnosis not present

## 2019-12-15 DIAGNOSIS — T22211A Burn of second degree of right forearm, initial encounter: Secondary | ICD-10-CM | POA: Diagnosis present

## 2019-12-15 DIAGNOSIS — Z0189 Encounter for other specified special examinations: Secondary | ICD-10-CM

## 2019-12-15 LAB — CK: Total CK: 2527 U/L — ABNORMAL HIGH (ref 38–234)

## 2019-12-15 LAB — POCT I-STAT 7, (LYTES, BLD GAS, ICA,H+H)
Acid-Base Excess: 3 mmol/L — ABNORMAL HIGH (ref 0.0–2.0)
Bicarbonate: 29.3 mmol/L — ABNORMAL HIGH (ref 20.0–28.0)
Calcium, Ion: 1.27 mmol/L (ref 1.15–1.40)
HCT: 39 % (ref 36.0–46.0)
Hemoglobin: 13.3 g/dL (ref 12.0–15.0)
O2 Saturation: 98 %
Patient temperature: 83
Potassium: 4.1 mmol/L (ref 3.5–5.1)
Sodium: 143 mmol/L (ref 135–145)
TCO2: 31 mmol/L (ref 22–32)
pCO2 arterial: 35.1 mmHg (ref 32.0–48.0)
pH, Arterial: 7.491 — ABNORMAL HIGH (ref 7.350–7.450)
pO2, Arterial: 76 mmHg — ABNORMAL LOW (ref 83.0–108.0)

## 2019-12-15 LAB — RAPID URINE DRUG SCREEN, HOSP PERFORMED
Amphetamines: NOT DETECTED
Barbiturates: POSITIVE — AB
Benzodiazepines: POSITIVE — AB
Cocaine: NOT DETECTED
Opiates: NOT DETECTED
Tetrahydrocannabinol: NOT DETECTED

## 2019-12-15 LAB — URINALYSIS, ROUTINE W REFLEX MICROSCOPIC
Bilirubin Urine: NEGATIVE
Glucose, UA: NEGATIVE mg/dL
Hgb urine dipstick: NEGATIVE
Ketones, ur: NEGATIVE mg/dL
Leukocytes,Ua: NEGATIVE
Nitrite: NEGATIVE
Protein, ur: NEGATIVE mg/dL
Specific Gravity, Urine: 1.017 (ref 1.005–1.030)
pH: 6 (ref 5.0–8.0)

## 2019-12-15 LAB — COMPREHENSIVE METABOLIC PANEL
ALT: 23 U/L (ref 0–44)
AST: 45 U/L — ABNORMAL HIGH (ref 15–41)
Albumin: 3.5 g/dL (ref 3.5–5.0)
Alkaline Phosphatase: 64 U/L (ref 38–126)
Anion gap: 9 (ref 5–15)
BUN: 10 mg/dL (ref 8–23)
CO2: 24 mmol/L (ref 22–32)
Calcium: 8.7 mg/dL — ABNORMAL LOW (ref 8.9–10.3)
Chloride: 106 mmol/L (ref 98–111)
Creatinine, Ser: 0.63 mg/dL (ref 0.44–1.00)
GFR calc Af Amer: >60 mL/min (ref >60)
GFR calc non Af Amer: >60 mL/min (ref >60)
Glucose, Bld: 152 mg/dL — ABNORMAL HIGH (ref 70–99)
Potassium: 4.9 mmol/L (ref 3.5–5.1)
Sodium: 139 mmol/L (ref 135–145)
Total Bilirubin: 0.5 mg/dL (ref 0.3–1.2)
Total Protein: 6.2 g/dL — ABNORMAL LOW (ref 6.5–8.1)

## 2019-12-15 LAB — POCT I-STAT EG7
Acid-Base Excess: 1 mmol/L (ref 0.0–2.0)
Bicarbonate: 28.1 mmol/L — ABNORMAL HIGH (ref 20.0–28.0)
Calcium, Ion: 1.05 mmol/L — ABNORMAL LOW (ref 1.15–1.40)
HCT: 44 % (ref 36.0–46.0)
Hemoglobin: 15 g/dL (ref 12.0–15.0)
O2 Saturation: 99 %
Potassium: 5.5 mmol/L — ABNORMAL HIGH (ref 3.5–5.1)
Sodium: 139 mmol/L (ref 135–145)
TCO2: 30 mmol/L (ref 22–32)
pCO2, Ven: 50.9 mmHg (ref 44.0–60.0)
pH, Ven: 7.349 (ref 7.250–7.430)
pO2, Ven: 164 mmHg — ABNORMAL HIGH (ref 32.0–45.0)

## 2019-12-15 LAB — CBC
HCT: 45.7 % (ref 36.0–46.0)
Hemoglobin: 14.9 g/dL (ref 12.0–15.0)
MCH: 30 pg (ref 26.0–34.0)
MCHC: 32.6 g/dL (ref 30.0–36.0)
MCV: 92 fL (ref 80.0–100.0)
Platelets: 207 10*3/uL (ref 150–400)
RBC: 4.97 MIL/uL (ref 3.87–5.11)
RDW: 12.9 % (ref 11.5–15.5)
WBC: 3.1 10*3/uL — ABNORMAL LOW (ref 4.0–10.5)
nRBC: 0 % (ref 0.0–0.2)

## 2019-12-15 LAB — ETHANOL: Alcohol, Ethyl (B): <10 mg/dL (ref <10)

## 2019-12-15 LAB — TROPONIN I (HIGH SENSITIVITY)
Troponin I (High Sensitivity): 3 ng/L (ref <18)
Troponin I (High Sensitivity): 5 ng/L (ref <18)

## 2019-12-15 LAB — RESPIRATORY PANEL BY RT PCR (FLU A&B, COVID)
Influenza A by PCR: NEGATIVE
Influenza B by PCR: NEGATIVE
SARS Coronavirus 2 by RT PCR: NEGATIVE

## 2019-12-15 LAB — PROTIME-INR
INR: 0.9 (ref 0.8–1.2)
Prothrombin Time: 12.4 seconds (ref 11.4–15.2)

## 2019-12-15 LAB — LACTIC ACID, PLASMA
Lactic Acid, Venous: 2.7 mmol/L (ref 0.5–1.9)
Lactic Acid, Venous: 2 mmol/L (ref 0.5–1.9)

## 2019-12-15 LAB — TSH: TSH: 1.083 u[IU]/mL (ref 0.350–4.500)

## 2019-12-15 LAB — MRSA PCR SCREENING: MRSA by PCR: NEGATIVE

## 2019-12-15 LAB — ACETAMINOPHEN LEVEL: Acetaminophen (Tylenol), Serum: <10 ug/mL — ABNORMAL LOW (ref 10–30)

## 2019-12-15 LAB — CORTISOL: Cortisol, Plasma: 19.8 ug/dL

## 2019-12-15 LAB — SALICYLATE LEVEL: Salicylate Lvl: <7 mg/dL — ABNORMAL LOW (ref 7.0–30.0)

## 2019-12-15 LAB — MAGNESIUM: Magnesium: 1.9 mg/dL (ref 1.7–2.4)

## 2019-12-15 MED ORDER — FENTANYL CITRATE (PF) 100 MCG/2ML IJ SOLN
50.0000 ug | INTRAMUSCULAR | Status: DC | PRN
  Administered 2019-12-16: 200 ug via INTRAVENOUS
  Administered 2019-12-16 (×2): 50 ug via INTRAVENOUS
  Administered 2019-12-16: 21:00:00 200 ug via INTRAVENOUS
  Administered 2019-12-17 (×2): 50 ug via INTRAVENOUS
  Administered 2019-12-17 (×4): 100 ug via INTRAVENOUS
  Filled 2019-12-15 (×2): qty 2
  Filled 2019-12-15: qty 4
  Filled 2019-12-15 (×5): qty 2
  Filled 2019-12-15: qty 4
  Filled 2019-12-15 (×4): qty 2

## 2019-12-15 MED ORDER — ORAL CARE MOUTH RINSE
15.0000 mL | OROMUCOSAL | Status: DC
  Administered 2019-12-15 – 2019-12-16 (×6): 15 mL via OROMUCOSAL

## 2019-12-15 MED ORDER — DOCUSATE SODIUM 50 MG/5ML PO LIQD
100.0000 mg | Freq: Two times a day (BID) | ORAL | Status: DC | PRN
  Filled 2019-12-15: qty 10

## 2019-12-15 MED ORDER — DOPAMINE-DEXTROSE 3.2-5 MG/ML-% IV SOLN
0.0000 ug/kg/min | INTRAVENOUS | Status: DC
  Filled 2019-12-15: qty 250

## 2019-12-15 MED ORDER — ATROPINE SULFATE 1 MG/10ML IJ SOSY
PREFILLED_SYRINGE | INTRAMUSCULAR | Status: AC
  Filled 2019-12-15: qty 10

## 2019-12-15 MED ORDER — CALCIUM GLUCONATE 10 % IV SOLN
1.0000 g | Freq: Once | INTRAVENOUS | Status: AC
  Administered 2019-12-15: 17:00:00 1 g via INTRAVENOUS
  Filled 2019-12-15: qty 10

## 2019-12-15 MED ORDER — SODIUM BICARBONATE 4 % IV SOLN: 5.0000 mL | Freq: Once | INTRAVENOUS | Status: DC

## 2019-12-15 MED ORDER — CHLORHEXIDINE GLUCONATE 0.12% ORAL RINSE (MEDLINE KIT)
15.0000 mL | Freq: Two times a day (BID) | OROMUCOSAL | Status: DC
  Administered 2019-12-15 – 2019-12-16 (×2): 15 mL via OROMUCOSAL

## 2019-12-15 MED ORDER — PANTOPRAZOLE SODIUM 40 MG IV SOLR
40.0000 mg | Freq: Every day | INTRAVENOUS | Status: DC
  Administered 2019-12-15: 40 mg via INTRAVENOUS
  Filled 2019-12-15: qty 40

## 2019-12-15 MED ORDER — SODIUM BICARBONATE 8.4 % IV SOLN
INTRAVENOUS | Status: AC
  Administered 2019-12-15: 17:00:00 50 meq
  Filled 2019-12-15: qty 50

## 2019-12-15 MED ORDER — SODIUM CHLORIDE 0.9 % IV BOLUS
1000.0000 mL | Freq: Once | INTRAVENOUS | Status: AC
  Administered 2019-12-15: 1000 mL via INTRAVENOUS

## 2019-12-15 MED ORDER — SODIUM CHLORIDE 0.9 % IV BOLUS
1000.0000 mL | Freq: Once | INTRAVENOUS | Status: AC
  Administered 2019-12-15: 18:00:00 1000 mL via INTRAVENOUS

## 2019-12-15 MED ORDER — SIMVASTATIN 20 MG PO TABS: 40.0000 mg | ORAL_TABLET | Freq: Every day | ORAL | Status: DC

## 2019-12-15 MED ORDER — BISACODYL 10 MG RE SUPP: 10.0000 mg | Freq: Every day | RECTAL | Status: DC | PRN

## 2019-12-15 MED ORDER — LACTATED RINGERS IV SOLN
INTRAVENOUS | Status: DC
  Administered 2019-12-17: 900 mL via INTRAVENOUS

## 2019-12-15 MED ORDER — ENOXAPARIN SODIUM 40 MG/0.4ML ~~LOC~~ SOLN
40.0000 mg | Freq: Every day | SUBCUTANEOUS | Status: DC
  Administered 2019-12-15 – 2019-12-24 (×10): 40 mg via SUBCUTANEOUS
  Filled 2019-12-15 (×10): qty 0.4

## 2019-12-15 MED ORDER — MIDAZOLAM HCL 2 MG/2ML IJ SOLN: 2.0000 mg | INTRAMUSCULAR | Status: DC | PRN

## 2019-12-15 MED ORDER — ATROPINE SULFATE 1 MG/10ML IJ SOSY
0.5000 mg | PREFILLED_SYRINGE | Freq: Once | INTRAMUSCULAR | Status: AC
  Administered 2019-12-15: 17:00:00 0.5 mg via INTRAVENOUS

## 2019-12-15 MED ORDER — CHLORHEXIDINE GLUCONATE CLOTH 2 % EX PADS
6.0000 | MEDICATED_PAD | Freq: Every day | CUTANEOUS | Status: DC
  Administered 2019-12-16 – 2019-12-26 (×9): 6 via TOPICAL

## 2019-12-15 NOTE — ED Provider Notes (Signed)
[facility] Emergency Department Provider Note MRN:  [id number]  Arrival date & time: 12/15/19     Chief Complaint   Drug Overdose   History of Present Illness   Debbie Steele is a 64 y.o. year-old female with a history of hypertension, hyperlipidemia presenting to the ED with chief complaint of drug overdose. . Patient found unresponsive this morning.  Found with suicide note.  Last seen or heard of well was yesterday evening.  Cold to the touch per EMS, unresponsive, intubated in the field.  Found with multiple empty medication containers.  Review of Systems  Positive for unresponsiveness, overdose  Patient's Health History    Past Medical History:  Diagnosis Date  . Anxiety   . Depression   . Hyperlipidemia   . Hypertension     Past Surgical History:  Procedure Laterality Date  . CERVICAL ABLATION    . CHOLECYSTECTOMY    . TUBAL LIGATION      Family History  Problem Relation Age of Onset  . Diabetes Mother   . Glaucoma Mother   . Hypertension Father   . Anuerysm Father   . Hypertension Sister   . Diabetes Brother   . ALS Brother   . Glaucoma Maternal Grandfather   . Cancer Paternal Grandmother   . Cancer Paternal Grandfather     Social History   Socioeconomic History  . Marital status: Married    Spouse name: Not on file  . Number of children: Not on file  . Years of education: Not on file  . Highest education level: Not on file  Occupational History  . Not on file  Tobacco Use  . Smoking status: Current Every Day Smoker    Types: E-cigarettes  . Smokeless tobacco: Never Used  . Tobacco comment: E-Cigs  Substance and Sexual Activity  . Alcohol use: No  . Drug use: No  . Sexual activity: Not on file  Other Topics Concern  . Not on file  Social History Narrative  . Not on file   Social Determinants of Health   Financial Resource Strain:   . Difficulty of Paying Living Expenses: Not on file  Food Insecurity:   .  Worried About Charity fundraiser in the Last Year: Not on file  . Ran Out of Food in the Last Year: Not on file  Transportation Needs:   . Lack of Transportation (Medical): Not on file  . Lack of Transportation (Non-Medical): Not on file  Physical Activity:   . Days of Exercise per Week: Not on file  . Minutes of Exercise per Session: Not on file  Stress:   . Feeling of Stress : Not on file  Social Connections:   . Frequency of Communication with Friends and Family: Not on file  . Frequency of Social Gatherings with Friends and Family: Not on file  . Attends Religious Services: Not on file  . Active Member of Clubs or Organizations: Not on file  . Attends Archivist Meetings: Not on file  . Marital Status: Not on file  Intimate Partner Violence:   . Fear of Current or Ex-Partner: Not on file  . Emotionally Abused: Not on file  . Physically Abused: Not on file  . Sexually Abused: Not on file     Physical Exam  Vital Signs and Nursing Notes reviewed Vitals:   12/15/19 2045 12/15/19 2100  BP: 115/73 129/71  Pulse: 60 (!) 47  Resp: 17 18  Temp: (!)  89.9 F (32.2 C) (!) 90.6 F (32.6 C)  SpO2: 98% 100%    CONSTITUTIONAL: Ill-appearing NEURO: Intubated, unresponsive EYES: Pupils 2 mm, nonreactive ENT/NECK:  no LAD, no JVD CARDIO: Bradycardic rate, cold and poorly perfused PULM:  CTAB no wheezing or rhonchi GI/GU:  normal bowel sounds, non-distended, non-tender MSK/SPINE:  No gross deformities, no edema SKIN:  no rash, atraumatic PSYCH: Unable to assess  Diagnostic and Interventional Summary    EKG Interpretation  Date/Time:  Friday December 15 2019 16:19:57 EST Ventricular Rate:  39 PR Interval:    QRS Duration: 137 QT Interval:  682 QTC Calculation: 557 R Axis:   13 Text Interpretation: Junctional rhythm Left bundle branch block Baseline wander in lead(s) V3 V6 Confirmed by Gerlene Fee [phone]) on 12/15/2019 5:22:23 PM      Labs Reviewed  CBC -  Abnormal; Notable for the following components:      Result Value   WBC 3.1 (*)    All other components within normal limits  COMPREHENSIVE METABOLIC PANEL - Abnormal; Notable for the following components:   Glucose, Bld 152 (*)    Calcium 8.7 (*)    Total Protein 6.2 (*)    AST 45 (*)    All other components within normal limits  LACTIC ACID, PLASMA - Abnormal; Notable for the following components:   Lactic Acid, Venous 2.7 (*)    All other components within normal limits  URINALYSIS, ROUTINE W REFLEX MICROSCOPIC - Abnormal; Notable for the following components:   APPearance CLOUDY (*)    All other components within normal limits  CK - Abnormal; Notable for the following components:   Total CK 2,527 (*)    All other components within normal limits  ACETAMINOPHEN LEVEL - Abnormal; Notable for the following components:   Acetaminophen (Tylenol), Serum <10 (*)    All other components within normal limits  SALICYLATE LEVEL - Abnormal; Notable for the following components:   Salicylate Lvl <5.2 (*)    All other components within normal limits  RAPID URINE DRUG SCREEN, HOSP PERFORMED - Abnormal; Notable for the following components:   Benzodiazepines POSITIVE (*)    Barbiturates POSITIVE (*)    All other components within normal limits  LACTIC ACID, PLASMA - Abnormal; Notable for the following components:   Lactic Acid, Venous 2.0 (*)    All other components within normal limits  POCT I-STAT EG7 - Abnormal; Notable for the following components:   pO2, Ven 164.0 (*)    Bicarbonate 28.1 (*)    Potassium 5.5 (*)    Calcium, Ion 1.05 (*)    All other components within normal limits  POCT I-STAT 7, (LYTES, BLD GAS, ICA,H+H) - Abnormal; Notable for the following components:   pH, Arterial 7.491 (*)    pO2, Arterial 76.0 (*)    Bicarbonate 29.3 (*)    Acid-Base Excess 3.0 (*)    All other components within normal limits  RESPIRATORY PANEL BY RT PCR (FLU A&B, COVID)  CULTURE, BLOOD  (ROUTINE X 2)  CULTURE, BLOOD (ROUTINE X 2)  CULTURE, RESPIRATORY  ETHANOL  PROTIME-INR  MAGNESIUM  TSH  CORTISOL  BASIC METABOLIC PANEL  CBC  BLOOD GAS, ARTERIAL  URINALYSIS, ROUTINE W REFLEX MICROSCOPIC  TROPONIN I (HIGH SENSITIVITY)  TROPONIN I (HIGH SENSITIVITY)    XR Chest Single View  Final Result    CT Head  Final Result    DG Chest Port 1 View    (Results Pending)    Medications  chlorhexidine gluconate (MEDLINE KIT) (PERIDEX) 0.12 % solution 15 mL (has no administration in time range)  MEDLINE mouth rinse (has no administration in time range)  pantoprazole (PROTONIX) injection 40 mg (40 mg Intravenous Given 12/15/19 2002)  fentaNYL (SUBLIMAZE) injection 50-200 mcg (has no administration in time range)  midazolam (VERSED) injection 2 mg (has no administration in time range)  docusate (COLACE) 50 MG/5ML liquid 100 mg (has no administration in time range)  bisacodyl (DULCOLAX) suppository 10 mg (has no administration in time range)  lactated ringers infusion ( Intravenous New Bag/Given 12/15/19 2002)  simvastatin (ZOCOR) tablet 40 mg (has no administration in time range)  DOPamine (INTROPIN) 800 mg in dextrose 5 % 250 mL (3.2 mg/mL) infusion (has no administration in time range)  calcium gluconate inj 10% (1 g) URGENT USE ONLY! (1 g Intravenous Given 12/15/19 1633)  sodium bicarbonate 1 mEq/mL injection (50 mEq  Given 12/15/19 1633)  sodium chloride 0.9 % bolus 1,000 mL (0 mLs Intravenous Stopped 12/15/19 1756)  atropine 1 MG/10ML injection 0.5 mg (0.5 mg Intravenous Given 12/15/19 1644)  sodium chloride 0.9 % bolus 1,000 mL (1,000 mLs Intravenous New Bag/Given 12/15/19 1812)     Procedures  /  Critical Care .Critical Care Performed by: Maudie Flakes, MD Authorized by: Maudie Flakes, MD   Critical care provider statement:    Critical care time (minutes):  45   Critical care was necessary to treat or prevent imminent or life-threatening deterioration of the following  conditions:  [place]oxid[place] (Severe overdose)   Critical care was time spent personally by me on the following activities:  Discussions with consultants, evaluation of patient's response to treatment, examination of patient, ordering and performing treatments and interventions, ordering and review of laboratory studies, ordering and review of radiographic studies, pulse oximetry, re-evaluation of patient's condition, obtaining history from patient or surrogate and review of old charts    ED Course and Medical Decision Making  I have reviewed the triage vital signs and the nursing notes.  Pertinent labs & imaging results that were available during my care of the patient were reviewed by me and considered in my medical decision making (see below for details).     Concern for severe overdose with likely anoxic brain injury.  Patient arrives intubated, oxygen saturations 100%, bilateral breath sounds.  She has strong peripheral pulses but is very cold.  Internal temperature 83 F.  Rewarming initiated.  She is bradycardic, with EKG demonstrating some prolongation of the QRS.  Empirically given amp of bicarbonate, gram of calcium gluconate.  Also given 0.5 mg atropine without significant response.  Patient found with empty prescription bottles of tizanidine, alprazolam, eszopiclone.  She is also prescribed amlodipine at home but this was not found with her.  Will consult poison control for further recommendations.  Patient will need ICU admission.  CT head pending.  5:27 PM update: Spoke with poison control, who will touch base with toxicologist for any other ideas or management strategies, but at this time supportive care is the plan moving forward.  Admitted to the intensivist service for further care.  Barth Kirks. Sedonia Small, [place] [email or web address]  Final Clinical Impressions(s) / ED Diagnoses     ICD-10-CM   1. Intentional drug overdose, initial  encounter ([place])  T50.902A   2. Altered behavior  R46.89 XR Chest Single View    XR Chest Single View  3. Respiratory failure ([place])  J96.90 DG Chest [facility]  DG Chest Port 1 View    ED Discharge Orders    None       Discharge Instructions Discussed with and Provided to Patient:   Discharge Instructions   None       Maudie Flakes, MD 12/15/19 2110

## 2019-12-15 NOTE — Progress Notes (Signed)
[facility] Progress Note Patient Name: Debbie Steele DOB: 02-25-1956 MRN: [id number]   Date of Service  12/15/2019  HPI/Events of Note  Pt admx with OD of benzo and muscle relaxer.  Per poison control, needs orders.  eICU Interventions  Ordered APAP, salicylate, ETOH, CK, CMET as requested.         Richardson Dopp, PA-C  12/15/2019, 11:03 PM

## 2019-12-15 NOTE — Progress Notes (Signed)
[facility] Progress Note Patient Name: Debbie Steele DOB: 06-23-1956 MRN: [id number]   Date of Service  12/15/2019  HPI/Events of Note  63 admit with OD of benzo, muscle relaxers, hypotensive, bradycardic, hypothermic.  Head CT neg.  eICU Interventions  BP, HR now stable.  Temp up to 35.6.  Tele: AF with controlled VR.  Needs VTE prophylaxis.  Will order.     Intervention Category Evaluation Type: New Patient Evaluation  Richardson Dopp, PA-C  12/15/2019, 11:16 PM

## 2019-12-15 NOTE — ED Triage Notes (Signed)
Brought by [employer] from home.  Called out for unresponsive.  Last seen at 7pm Thursday.  Empty pill bottles found beside the bed.  Xanax and Lunesta,  Unsure how many taken.  Given narcan and atropine at scene with no response to either.  CBG reported at 145.  Intubated en route.

## 2019-12-15 NOTE — H&P (Signed)
NAME:  Debbie Steele, MRN:  [id number], DOB:  [birth year], LOS: 0 ADMISSION DATE:  12/15/2019, CONSULTATION DATE:  12/15/2019 REFERRING MD:  EDP - Bre[place], CHIEF COMPLAINT:  Acute hypoxemic respiratory failure   Brief History   64 year old female with PMH of bipolar disorder who has xanax and muscle relaxant at home and per report was found down unresponsive in her bedroom with presumed drug overdose with xanax and lunesta empty pill bottles.  Patient was brought to the ED where she was intubated.  PCCM called to admit.  No further history available.  History of present illness   64 year old female with PMH of bipolar disorder who has xanax and muscle relaxant at home and per report was found down unresponsive in her bedroom with presumed drug overdose with xanax and lunesta empty pill bottles.  Patient was brought to the ED where she was intubated.  PCCM called to admit.  No further history available.  Past Medical History  Bipolar disorder, HTN and dyslipidemia  Significant Hospital Events   ETT for OD  Consults:  PCCM  Procedures:  ETT 12/15/2019>>>  Significant Diagnostic Tests:  CT of the brain negative  Micro Data:  Blood 12/15/2019>>> U/A 12/15/2019>>> Sputum 12/15/2019>>>  Antimicrobials:  None   Interim history/subjective:  N/A  Objective   Blood pressure (!) 89/67, pulse (!) 39, temperature (!) 84 F (28.9 C), resp. rate 16, height 5\' 8"  (1.727 m), weight 113.1 kg, last menstrual period 04/24/2014, SpO2 96 %.    Vent Mode: PRVC FiO2 (%):  [40 %-60 %] 40 % Set Rate:  [12 bmp-16 bmp] 12 bmp Vt Set:  [510 mL] 510 mL PEEP:  [5 cmH20] 5 cmH20 Plateau Pressure:  [22 cmH20] 22 cmH20   Intake/Output Summary (Last 24 hours) at 12/15/2019 1845 Last data filed at 12/15/2019 1756 Gross per 24 hour  Intake 1000 ml  Output --  Net 1000 ml   Filed Weights   12/15/19 1704  Weight: 113.1 kg    Examination: General: Acutely ill appearing female, NAD HENT: [place]/AT, PERRL, EOM-I and  MMM, ETT in place Lungs: Distant bilaterally Cardiovascular: RRR, Nl S1/S2 and -M/R/G Abdomen: Soft, NT, ND and +BS Extremities: -edema and -tenderness Neuro: Unresponsive, sedated and paralyzed Skin: Intact  I reviewed CXR myself, ETT is in a good position  Discussed with PCCM-NP and resident.  Resolved Hospital Problem list   N/A  Assessment & Plan:  64 year old female with PMH of bipolar and HTN who presents to PCCM with respiratory failure post benzos and muscle relaxant OD.  Intubated and PCCM is to admit.  VDRF: due to overdose - Adjust vent for ABG - Withdraw ETT 2 cm - ABG and CXR in AM - VAP prevention - Titrate O2 for sat of 88-92%  Bradycardia and Hypotension: due to hypothermia - Dopamine drip - IVF - Tele monitoring  AMS: due to overdose - Minimize sedation - Head CT negative, monitor - PRN sedation only  Hypothermia: - Bear hugger - Pan culture - Cortisol level - TSH and free T4  PCCM will admit  Labs   CBC: Recent Labs  Lab 12/15/19 1636 12/15/19 1701 12/15/19 1710  WBC 3.1*  --   --   HGB 14.9 15.0 13.3  HCT 45.7 44.0 39.0  MCV 92.0  --   --   PLT 207  --   --     Basic Metabolic Panel: Recent Labs  Lab 12/15/19 1636 12/15/19 1701 12/15/19  1710  NA 139 139 143  K 4.9 5.5* 4.1  CL 106  --   --   CO2 24  --   --   GLUCOSE 152*  --   --   BUN 10  --   --   CREATININE 0.63  --   --   CALCIUM 8.7*  --   --    GFR: Estimated Creatinine Clearance: 97.5 mL/min (by C-G formula based on SCr of 0.63 mg/dL). Recent Labs  Lab 12/15/19 1636  WBC 3.1*  LATICACIDVEN 2.7*    Liver Function Tests: Recent Labs  Lab 12/15/19 1636  AST 45*  ALT 23  ALKPHOS 64  BILITOT 0.5  PROT 6.2*  ALBUMIN 3.5   No results for input(s): LIPASE, AMYLASE in the last 168 hours. No results for input(s): AMMONIA in the last 168 hours.  ABG    Component Value Date/Time   PHART 7.491 (H) 12/15/2019 1710   PCO2ART 35.1 12/15/2019 1710   PO2ART  76.0 (L) 12/15/2019 1710   HCO3 29.3 (H) 12/15/2019 1710   TCO2 31 12/15/2019 1710   O2SAT 98.0 12/15/2019 1710     Coagulation Profile: Recent Labs  Lab 12/15/19 1636  INR 0.9    Cardiac Enzymes: Recent Labs  Lab 12/15/19 1636  CKTOTAL 2,527*    HbA1C: HB A1C (BAYER DCA - WAIVED)  Date/Time Value Ref Range Status  09/30/2017 09:33 AM 6.3 <7.0 % Final    Comment:                                          Diabetic Adult            <7.0                                       Healthy Adult        4.3 - 5.7                                                           (DCCT/NGSP) American Diabetes Association's Summary of Glycemic Recommendations for Adults with Diabetes: Hemoglobin A1c <7.0%. More stringent glycemic goals (A1c <6.0%) may further reduce complications at the cost of increased risk of hypoglycemia.   06/10/2017 09:00 AM 6.3 <7.0 % Final    Comment:                                          Diabetic Adult            <7.0                                       Healthy Adult        4.3 - 5.7                                                           (  DCCT/NGSP) American Diabetes Association's Summary of Glycemic Recommendations for Adults with Diabetes: Hemoglobin A1c <7.0%. More stringent glycemic goals (A1c <6.0%) may further reduce complications at the cost of increased risk of hypoglycemia.     CBG: No results for input(s): GLUCAP in the last 168 hours.  Review of Systems:   Unattainable  Past Medical History  She,  has a past medical history of Anxiety, Depression, Hyperlipidemia, and Hypertension.   Surgical History    Past Surgical History:  Procedure Laterality Date  . CERVICAL ABLATION    . CHOLECYSTECTOMY    . TUBAL LIGATION       Social History   reports that she has been smoking e-cigarettes. She has never used smokeless tobacco. She reports that she does not drink alcohol or use drugs.   Family History   Her family history includes ALS in  her brother; Anuerysm in her father; Cancer in her paternal grandfather and paternal grandmother; Diabetes in her brother and mother; Glaucoma in her maternal grandfather and mother; Hypertension in her father and sister.   Allergies Allergies  Allergen Reactions  . Nuedexta [Dextromethorphan-Quinidine] Hives  . Depakene [Valproic Acid] Itching and Rash     Home Medications  Prior to Admission medications   Medication Sig Start Date End Date Taking? Authorizing Provider  amLODipine (NORVASC) 5 MG tablet Take 1 tablet (5 mg total) by mouth daily. 11/04/17   Eustaquio Maize, MD  CLONAZEPAM PO Take by mouth.    [provider]  cloNIDine (CATAPRES) 0.1 MG tablet Take 1 tablet (0.1 mg total) by mouth 3 (three) times daily. Patient taking differently: Take 0.1 mg by mouth at bedtime.  10/02/14   Lysbeth Penner, FNP  escitalopram (LEXAPRO) 10 MG tablet Take 10 mg by mouth daily.    [provider]  furosemide (LASIX) 40 MG tablet TAKE 1 TABLET (40 MG TOTAL) BY MOUTH EVERY MORNING. Patient taking differently: TAKE 1 TABLET (40 MG TOTAL) BY MOUTH EVERY MORNING as needed 01/11/15   Chipper Herb, MD  gabapentin (NEURONTIN) 300 MG capsule Take 300 mg by mouth daily.    [provider]  glucose blood test strip Check BS QD and PRN 03/31/17   Dettinger, Fransisca Kaufmann, MD  lamoTRIgine (LAMICTAL) 200 MG tablet Take 150 mg by mouth daily.     [provider]  Lancets Glory Rosebush ULTRASOFT) lancets Use to check blood sugars once a day 11/02/17   Dettinger, Fransisca Kaufmann, MD  simvastatin (ZOCOR) 40 MG tablet TAKE 1 TABLET (40 MG TOTAL) BY MOUTH AT BEDTIME. 09/30/17   Dettinger, Fransisca Kaufmann, MD    The patient is critically ill with multiple organ systems failure and requires high complexity decision making for assessment and support, frequent evaluation and titration of therapies, application of advanced monitoring technologies and extensive interpretation of multiple databases.    Critical Care Time devoted to patient care services described in this note is  35  Minutes. This time reflects time of care of this signee Dr Jennet Maduro. This critical care time does not reflect procedure time, or teaching time or supervisory time of PA/NP/Med student/Med Resident etc but could involve care discussion time.  Rush Farmer, M.D. [facility]/Critical Care Medicine.

## 2019-12-15 NOTE — Progress Notes (Signed)
Debbie Steele with Poison Control  [phone] is following. Called asking for labs. (Acetaminphen, Asprin, CMP, Ethanol, CK). Request made to [facility].   Kathleene Hazel RN

## 2019-12-15 NOTE — Progress Notes (Signed)
Pulled ET tube back 2cm

## 2019-12-16 ENCOUNTER — Inpatient Hospital Stay (HOSPITAL_COMMUNITY): Payer: Medicare Other

## 2019-12-16 LAB — CBC
HCT: 45.7 % (ref 36.0–46.0)
Hemoglobin: 15.2 g/dL — ABNORMAL HIGH (ref 12.0–15.0)
MCH: 29.7 pg (ref 26.0–34.0)
MCHC: 33.3 g/dL (ref 30.0–36.0)
MCV: 89.4 fL (ref 80.0–100.0)
Platelets: 321 10*3/uL (ref 150–400)
RBC: 5.11 MIL/uL (ref 3.87–5.11)
RDW: 13 % (ref 11.5–15.5)
WBC: 8 10*3/uL (ref 4.0–10.5)
nRBC: 0 % (ref 0.0–0.2)

## 2019-12-16 LAB — COMPREHENSIVE METABOLIC PANEL
ALT: 35 U/L (ref 0–44)
AST: 86 U/L — ABNORMAL HIGH (ref 15–41)
Albumin: 3.1 g/dL — ABNORMAL LOW (ref 3.5–5.0)
Alkaline Phosphatase: 61 U/L (ref 38–126)
Anion gap: 10 (ref 5–15)
BUN: 9 mg/dL (ref 8–23)
CO2: 24 mmol/L (ref 22–32)
Calcium: 8.7 mg/dL — ABNORMAL LOW (ref 8.9–10.3)
Chloride: 113 mmol/L — ABNORMAL HIGH (ref 98–111)
Creatinine, Ser: 0.71 mg/dL (ref 0.44–1.00)
GFR calc Af Amer: >60 mL/min (ref >60)
GFR calc non Af Amer: >60 mL/min (ref >60)
Glucose, Bld: 128 mg/dL — ABNORMAL HIGH (ref 70–99)
Potassium: 3.7 mmol/L (ref 3.5–5.1)
Sodium: 147 mmol/L — ABNORMAL HIGH (ref 135–145)
Total Bilirubin: 0.7 mg/dL (ref 0.3–1.2)
Total Protein: 6 g/dL — ABNORMAL LOW (ref 6.5–8.1)

## 2019-12-16 LAB — POCT I-STAT 7, (LYTES, BLD GAS, ICA,H+H)
Acid-Base Excess: 3 mmol/L — ABNORMAL HIGH (ref 0.0–2.0)
Bicarbonate: 28.2 mmol/L — ABNORMAL HIGH (ref 20.0–28.0)
Calcium, Ion: 1.25 mmol/L (ref 1.15–1.40)
HCT: 45 % (ref 36.0–46.0)
Hemoglobin: 15.3 g/dL — ABNORMAL HIGH (ref 12.0–15.0)
O2 Saturation: 99 %
Patient temperature: 37.4
Potassium: 3.8 mmol/L (ref 3.5–5.1)
Sodium: 146 mmol/L — ABNORMAL HIGH (ref 135–145)
TCO2: 30 mmol/L (ref 22–32)
pCO2 arterial: 46.6 mmHg (ref 32.0–48.0)
pH, Arterial: 7.392 (ref 7.350–7.450)
pO2, Arterial: 126 mmHg — ABNORMAL HIGH (ref 83.0–108.0)

## 2019-12-16 LAB — SALICYLATE LEVEL: Salicylate Lvl: <7 mg/dL — ABNORMAL LOW (ref 7.0–30.0)

## 2019-12-16 LAB — CK: Total CK: 10346 U/L — ABNORMAL HIGH (ref 38–234)

## 2019-12-16 LAB — ETHANOL: Alcohol, Ethyl (B): <10 mg/dL (ref <10)

## 2019-12-16 LAB — ACETAMINOPHEN LEVEL: Acetaminophen (Tylenol), Serum: <10 ug/mL — ABNORMAL LOW (ref 10–30)

## 2019-12-16 MED ORDER — LORAZEPAM 2 MG/ML IJ SOLN
2.0000 mg | INTRAMUSCULAR | Status: DC | PRN
  Administered 2019-12-16 – 2019-12-20 (×20): 2 mg via INTRAVENOUS
  Filled 2019-12-16 (×20): qty 1

## 2019-12-16 MED ORDER — ORAL CARE MOUTH RINSE
15.0000 mL | Freq: Two times a day (BID) | OROMUCOSAL | Status: DC
  Administered 2019-12-17 – 2019-12-26 (×8): 15 mL via OROMUCOSAL

## 2019-12-16 MED ORDER — LABETALOL HCL 5 MG/ML IV SOLN: 10.0000 mg | INTRAVENOUS | Status: DC | PRN

## 2019-12-16 MED ORDER — LABETALOL HCL 5 MG/ML IV SOLN
10.0000 mg | INTRAVENOUS | Status: DC | PRN
  Administered 2019-12-16 – 2019-12-21 (×4): 10 mg via INTRAVENOUS
  Filled 2019-12-16 (×5): qty 4

## 2019-12-16 NOTE — Progress Notes (Addendum)
Spoke to poison control via phone, updated them on patient's condition. PC suggested Ativan as a treatment for agitation related to potential benzodiazepine withdrawal. Also consulted with wound care nurse regarding treatment of thermal injury on right forearm, received care orders.

## 2019-12-16 NOTE — Progress Notes (Signed)
Updated poison control on pt status. Repeated EKG per recommendation to assess QR interval. Pt's rhythm, NSR, rate in 70s, QTC normalized. Toxicology recommended to give alprazolam for anxiety.

## 2019-12-16 NOTE — Progress Notes (Signed)
Patient's heart rate reached 130's with BP 180's/70's. Paged Dr. Lynetta Mare and received order for 10 mg Labetalol push and 2 mg ativan for agitation/possible withdrawal symptoms.

## 2019-12-16 NOTE — Progress Notes (Addendum)
Spoke to [employer] from [employer] and the decision was made to put off a video conference until tomorrow. The patient is not alert and oriented or following commands at this time.

## 2019-12-16 NOTE — Procedures (Signed)
Extubation Procedure Note  Patient Details:   Name: Debbie Steele DOB: November 17, 1956 MRN: [id number]   Airway Documentation:    Vent end date: 12/16/19 Vent end time: 1101   Evaluation  O2 sats: stable throughout Complications: No apparent complications Patient did tolerate procedure well. Bilateral Breath Sounds: Clear   No   Pt was extubated at 1101 to [facility] per MD order. Pt was suctioned and had a positive cuff leak prior. Pt was not able to speak afterwards and is not responding to voice. MD was made aware of RT concerns. Pt saturations are 96% and no stridor was noted afterwards. RT will continue to monitor.   Debbie Steele A Kevonta Phariss 12/16/2019, 11:04 AM

## 2019-12-16 NOTE — Progress Notes (Addendum)
NAME:  Debbie Steele, MRN:  [id number], DOB:  05-25-1956, LOS: 1 ADMISSION DATE:  12/15/2019, CONSULTATION DATE:  12/16/2019 REFERRING MD:  Gerlene Fee MD CHIEF COMPLAINT:  Acute respiratory failure 2/2 BZD OD  Brief History   64 year old female with PMH of bipolar disorder who has xanax and muscle relaxant at home and per report was found down unresponsive in her bedroom with presumed drug overdose with xanax and lunesta empty pill bottles.  Patient was brought to the ED where she was intubated.  PCCM called to admit.    History of present illness   64 year old female with PMH of bipolar disorder who has xanax and muscle relaxant at home and per report was found down unresponsive in her bedroom with presumed drug overdose with xanax and lunesta empty pill bottles.  Patient was brought to the ED where she was intubated.  PCCM called to admit.  No further history available.  Past Medical History  Bipolar disorder, hypertension, dyslipidemia   Significant Hospital Events   1/8: ETT for OD  Consults:  PCCM  Procedures:  ETT 12/15/2019>>>  Significant Diagnostic Tests:  1/8: CT Head wo Contrast> negative   Micro Data:  1/8: BCx x2 > no growth <12 hrs 1/8 Influenza A and B, SARS CoV2 - negative  Antimicrobials:  None  Interim history/subjective:  Patient admitted with acute respiratory failure secondary to presumed xanax and lunesta overdose. Poison control contacted and labs ordered for acetaminophen, aspirin, ETOH, and CK levels. Otherwise, patient without any acute events overnight.   Objective   Blood pressure (!) 167/64, pulse 66, temperature 99.1 F (37.3 C), resp. rate 13, height 5\' 8"  (1.727 m), weight 119 kg, last menstrual period 04/24/2014, SpO2 100 %.    Vent Mode: PRVC FiO2 (%):  [40 %-60 %] 40 % Set Rate:  [12 bmp-16 bmp] 12 bmp Vt Set:  [510 mL] 510 mL PEEP:  [5 cmH20] 5 cmH20 Plateau Pressure:  [15 cmH20-26 cmH20] 15 cmH20   Intake/Output Summary (Last 24  hours) at 12/16/2019 0733 Last data filed at 12/16/2019 0600 Gross per 24 hour  Intake 1498.07 ml  Output 2160 ml  Net -661.93 ml   Filed Weights   12/15/19 1704 12/15/19 2115  Weight: 113.1 kg 119 kg    Examination: Physical Exam  Constitutional: She is well-developed, well-nourished, and in no distress.  HENT:  Head: Normocephalic and atraumatic.  Eyes: Pupils are equal, round, and reactive to light. EOM are normal.  Cardiovascular: Normal rate, regular rhythm, normal heart sounds and intact distal pulses.  Pulmonary/Chest: Effort normal and breath sounds normal.  Musculoskeletal:        General: Normal range of motion.     Cervical back: Normal range of motion and neck supple.  Neurological: She is alert.  Skin: Skin is warm and dry.    Resolved Hospital Problem list   Hypothermia Bradycardia and hypotension  Assessment & Plan:  64 year old female with PMH of bipolar and HTN who presents to PCCM with respiratory failure post benzos and muscle relaxant OD. She is awake and alert on examination with strong cough.   Acute respiratory failure secondary to BZD overdose s/p extubation:  Patient extubated today and is hemodynamically stable, maintaining good oxygen saturation on room air P:  Titrate O2 for sat 88-92%  1:1 sitter and psychiatry consult for suspected suicidal ideation  Patient does not require ICU level care at this time   Altered mental status/BZD withdrawal:  In setting of benzo and muscle relaxant overdose  CT head negative for acute intracranial abnormality P:  PRN ativan  Delirium precautions   Elevated CK levels: CK >10,000 in setting of BZD overdose P:  IVF with LR @ 100 cc/hr  Hypertension: Patient on atenolol-chlorthalidone at home  P: Labetalol prn  Resume home meds once more awake   Diabetes:  Patient takes metformin 500mg  daily  P:  CBG monitoring   Best practice:  Diet: NPO Pain/Anxiety/Delirium protocol (if indicated): fentanyl  and ativan prn  VAP protocol (if indicated): in place DVT prophylaxis: Lovenox GI prophylaxis: Protonix Glucose control: n/a Mobility: bed bound Code Status: FULL Family Communication: will update family  Disposition: Progressive   Labs   CBC: Recent Labs  Lab 12/15/19 1636 12/15/19 1701 12/15/19 1710 12/16/19 0045 12/16/19 0435  WBC 3.1*  --   --  8.0  --   HGB 14.9 15.0 13.3 15.2* 15.3*  HCT 45.7 44.0 39.0 45.7 45.0  MCV 92.0  --   --  89.4  --   PLT 207  --   --  321  --     Basic Metabolic Panel: Recent Labs  Lab 12/15/19 1636 12/15/19 1701 12/15/19 1710 12/15/19 1836 12/16/19 0045 12/16/19 0435  NA 139 139 143  --  147* 146*  K 4.9 5.5* 4.1  --  3.7 3.8  CL 106  --   --   --  113*  --   CO2 24  --   --   --  24  --   GLUCOSE 152*  --   --   --  128*  --   BUN 10  --   --   --  9  --   CREATININE 0.63  --   --   --  0.71  --   CALCIUM 8.7*  --   --   --  8.7*  --   MG  --   --   --  1.9  --   --    GFR: Estimated Creatinine Clearance: 100.1 mL/min (by C-G formula based on SCr of 0.71 mg/dL). Recent Labs  Lab 12/15/19 1636 12/15/19 1923 12/16/19 0045  WBC 3.1*  --  8.0  LATICACIDVEN 2.7* 2.0*  --     Liver Function Tests: Recent Labs  Lab 12/15/19 1636 12/16/19 0045  AST 45* 86*  ALT 23 35  ALKPHOS 64 61  BILITOT 0.5 0.7  PROT 6.2* 6.0*  ALBUMIN 3.5 3.1*   No results for input(s): LIPASE, AMYLASE in the last 168 hours. No results for input(s): AMMONIA in the last 168 hours.  ABG    Component Value Date/Time   PHART 7.392 12/16/2019 0435   PCO2ART 46.6 12/16/2019 0435   PO2ART 126.0 (H) 12/16/2019 0435   HCO3 28.2 (H) 12/16/2019 0435   TCO2 30 12/16/2019 0435   O2SAT 99.0 12/16/2019 0435     Coagulation Profile: Recent Labs  Lab 12/15/19 1636  INR 0.9    Cardiac Enzymes: Recent Labs  Lab 12/15/19 1636 12/16/19 0045  CKTOTAL 2,527* 10,346*    HbA1C: HB A1C (BAYER DCA - WAIVED)  Date/Time Value Ref Range Status   09/30/2017 09:33 AM 6.3 <7.0 % Final    Comment:  Diabetic Adult            <7.0                                       Healthy Adult        4.3 - 5.7                                                           (DCCT/NGSP) American Diabetes Association's Summary of Glycemic Recommendations for Adults with Diabetes: Hemoglobin A1c <7.0%. More stringent glycemic goals (A1c <6.0%) may further reduce complications at the cost of increased risk of hypoglycemia.   06/10/2017 09:00 AM 6.3 <7.0 % Final    Comment:                                          Diabetic Adult            <7.0                                       Healthy Adult        4.3 - 5.7                                                           (DCCT/NGSP) American Diabetes Association's Summary of Glycemic Recommendations for Adults with Diabetes: Hemoglobin A1c <7.0%. More stringent glycemic goals (A1c <6.0%) may further reduce complications at the cost of increased risk of hypoglycemia.     CBG: No results for input(s): GLUCAP in the last 168 hours.  Review of Systems:   Unable to attain as patient is on vent.   Past Medical History  She,  has a past medical history of Anxiety, Depression, Hyperlipidemia, and Hypertension.   Surgical History    Past Surgical History:  Procedure Laterality Date  . CERVICAL ABLATION    . CHOLECYSTECTOMY    . TUBAL LIGATION       Social History   reports that she has been smoking e-cigarettes. She has never used smokeless tobacco. She reports that she does not drink alcohol or use drugs.   Family History   Her family history includes ALS in her brother; Anuerysm in her father; Cancer in her paternal grandfather and paternal grandmother; Diabetes in her brother and mother; Glaucoma in her maternal grandfather and mother; Hypertension in her father and sister.   Allergies Allergies  Allergen Reactions  . Nuedexta [Dextromethorphan-Quinidine]  Hives  . Depakene [Valproic Acid] Itching and Rash     Home Medications  Prior to Admission medications   Medication Sig Start Date End Date Taking? Authorizing Provider  ALPRAZolam Duanne Moron) 0.5 MG tablet Take 0.5 mg by mouth at bedtime as needed for anxiety.   Yes [provider]  amLODipine (NORVASC) 5 MG tablet Take 1 tablet (5 mg total) by mouth  daily. Patient taking differently: Take 5 mg by mouth at bedtime.  11/04/17  Yes Eustaquio Maize, MD  atenolol-chlorthalidone (TENORETIC) 50-25 MG tablet Take 1 tablet by mouth daily.   Yes [provider]  Eszopiclone (ESZOPICLONE) 3 MG TABS Take 3 mg by mouth at bedtime. Take immediately before bedtime   Yes [provider]  gabapentin (NEURONTIN) 400 MG capsule Take 400 mg by mouth 2 (two) times daily.   Yes [provider]  glucose blood test strip Check BS QD and PRN 03/31/17  Yes Dettinger, Fransisca Kaufmann, MD  lamoTRIgine (LAMICTAL) 200 MG tablet Take 150 mg by mouth daily.    Yes [provider]  metFORMIN (GLUCOPHAGE) 500 MG tablet Take 500 mg by mouth daily.   Yes [provider]  primidone (MYSOLINE) 50 MG tablet Take 50 mg by mouth 2 (two) times daily.   Yes [provider]  sertraline (ZOLOFT) 100 MG tablet Take 100 mg by mouth daily.   Yes [provider]  simvastatin (ZOCOR) 40 MG tablet TAKE 1 TABLET (40 MG TOTAL) BY MOUTH AT BEDTIME. Patient taking differently: Take 40 mg by mouth daily.  09/30/17  Yes Dettinger, Fransisca Kaufmann, MD     Critical care time:   CRITICAL CARE Performed by: Harvie Heck  Total critical care time: 33 minutes  Critical care was necessary to treat or prevent imminent or life-threatening deterioration. Critical care was time spent personally by me on the following activities: development of treatment plan with patient and/or surrogate as well as nursing, discussions with consultants, evaluation of patient's response to treatment, examination  of patient, obtaining history from patient or surrogate, ordering and performing treatments and interventions, ordering and review of laboratory studies, ordering and review of radiographic studies, pulse oximetry and re-evaluation of patient's condition.   Harvie Heck, MD Internal Medicine, PGY-1 Pager #: [phone] 12/16/2019 7:45 AM

## 2019-12-16 NOTE — Progress Notes (Signed)
Danielle from [employer] called for an update on labs.  Information was given.  Kathleene Hazel RN

## 2019-12-16 NOTE — Consult Note (Addendum)
Attempted psychiatry consult.  Per RN patient "extubated about 20 minutes ago."  Patient currently not alert, not following commands.  Will attempt consult again tomorrow 12/17/2019.  MD notified.  Attest to NP Note

## 2019-12-16 NOTE — Consult Note (Signed)
[facility] Nurse Consult Note: Reason for Consult: Thermal injury to right forearm.  Injury sustained in fall; husband reported to RN that patient fell on a heater. Serous blister has ruptured and partial thickness wound bed is evidenced at 80% . Twenty (20%) of wound bed is at the most distal area which remains a serum filled blister. Wound type: Thermal Pressure Injury POA: N/A Measurement: 11cm x 5cm x 0.1cm Wound bed: as noted above: 80% red, moist.  20% of injury is intact serum filled blister (partial thickness). No evidence of necrosis or infection. Drainage (amount, consistency, odor) Small to moderate serous Periwound:Toward the most distal aspect of the injury is a small ridge of rolled epidermis. Dressing procedure/placement/frequency: I will implement a conservative POC with goal of antimicrobial and astringent wound contact layer using xeroform gauze changed twice daily. Due to the size, I will cover with an ABD pad and secure with a few turns of Kerlix roll gauze.  Changes are indicated twice daily to prevent drying and sticking. Other preventive orders are placed such a sacral prophylactic dressing and Prevalon Boots for heels.  Turning and repositioning is in place.  The assistance and expertise of the patient's Bedside RN, C. Kalman Shan is appreciated.  [facility] nursing team will not follow, but will remain available to this patient, the nursing and medical teams.  Please re-consult if needed. Thanks, Maudie Flakes, MSN, RN, [employer], Arther Abbott  Pager# [phone]

## 2019-12-17 LAB — CBC
HCT: 39.9 % (ref 36.0–46.0)
Hemoglobin: 13 g/dL (ref 12.0–15.0)
MCH: 29.5 pg (ref 26.0–34.0)
MCHC: 32.6 g/dL (ref 30.0–36.0)
MCV: 90.7 fL (ref 80.0–100.0)
Platelets: 261 10*3/uL (ref 150–400)
RBC: 4.4 MIL/uL (ref 3.87–5.11)
RDW: 13.4 % (ref 11.5–15.5)
WBC: 9.3 10*3/uL (ref 4.0–10.5)
nRBC: 0 % (ref 0.0–0.2)

## 2019-12-17 LAB — COMPREHENSIVE METABOLIC PANEL
ALT: 54 U/L — ABNORMAL HIGH (ref 0–44)
AST: 228 U/L — ABNORMAL HIGH (ref 15–41)
Albumin: 3.1 g/dL — ABNORMAL LOW (ref 3.5–5.0)
Alkaline Phosphatase: 52 U/L (ref 38–126)
Anion gap: 7 (ref 5–15)
BUN: 8 mg/dL (ref 8–23)
CO2: 26 mmol/L (ref 22–32)
Calcium: 8.6 mg/dL — ABNORMAL LOW (ref 8.9–10.3)
Chloride: 109 mmol/L (ref 98–111)
Creatinine, Ser: 0.64 mg/dL (ref 0.44–1.00)
GFR calc Af Amer: >60 mL/min (ref >60)
GFR calc non Af Amer: >60 mL/min (ref >60)
Glucose, Bld: 138 mg/dL — ABNORMAL HIGH (ref 70–99)
Potassium: 3.6 mmol/L (ref 3.5–5.1)
Sodium: 142 mmol/L (ref 135–145)
Total Bilirubin: 0.8 mg/dL (ref 0.3–1.2)
Total Protein: 5.9 g/dL — ABNORMAL LOW (ref 6.5–8.1)

## 2019-12-17 LAB — CK: Total CK: 18006 U/L — ABNORMAL HIGH (ref 38–234)

## 2019-12-17 MED ORDER — ALPRAZOLAM 0.5 MG PO TABS
0.5000 mg | ORAL_TABLET | Freq: Every day | ORAL | Status: DC
  Administered 2019-12-19 – 2019-12-25 (×7): 0.5 mg via ORAL
  Filled 2019-12-17 (×9): qty 1

## 2019-12-17 NOTE — Progress Notes (Signed)
Patient unable to interact, therefore psych consult discontinued. Will need reorder when patient more alert and able to respond.

## 2019-12-17 NOTE — Progress Notes (Signed)
Poison control called back LFT trending up  tTheir MD recommend obtaining CPK trends.

## 2019-12-17 NOTE — Progress Notes (Addendum)
NAME:  Debbie Steele, MRN:  [id number], DOB:  08-27-1956, LOS: 2 ADMISSION DATE:  12/15/2019, CONSULTATION DATE:  12/16/2019 REFERRING MD:  Gerlene Fee MD CHIEF COMPLAINT:  Acute respiratory failure 2/2 BZD OD  Brief History   64 year old female with PMH of bipolar disorder who has xanax and muscle relaxant at home and per report was found down unresponsive in her bedroom with presumed drug overdose with xanax and lunesta empty pill bottles.  Patient was brought to the ED where she was intubated.  PCCM called to admit.    History of present illness   64 year old female with PMH of bipolar disorder who has xanax and muscle relaxant at home and per report was found down unresponsive in her bedroom with presumed drug overdose with xanax and lunesta empty pill bottles.  Patient was brought to the ED where she was intubated.  PCCM called to admit.  No further history available.  Past Medical History  Bipolar disorder, hypertension, dyslipidemia   Significant Hospital Events   1/8: ETT for OD  Consults:  PCCM  Procedures:  ETT 12/15/2019>>>  Significant Diagnostic Tests:  1/8: CT Head wo Contrast> negative   Micro Data:  1/8: BCx x2 > no growth <12 hrs 1/8 Influenza A and B, SARS CoV2 - negative  Antimicrobials:  None  Interim history/subjective:  Patient admitted with acute respiratory failure secondary to presumed xanax and lunesta overdose. She was successfully extubated yesterday and maintains good oxygen saturation on 2L [place] and room air. She is still quite disoriented but responds to voice. She is noted to have some gargling speech.   Objective   Blood pressure (!) 143/63, pulse 85, temperature 98.4 F (36.9 C), temperature source Core, resp. rate 15, height 5\' 8"  (1.727 m), weight 119 kg, last menstrual period 04/24/2014, SpO2 98 %.    Vent Mode: PSV;CPAP FiO2 (%):  [40 %] 40 % PEEP:  [5 cmH20] 5 cmH20 Pressure Support:  [10 cmH20] 10 cmH20   Intake/Output Summary (Last  24 hours) at 12/17/2019 0731 Last data filed at 12/17/2019 0700 Gross per 24 hour  Intake 1941.31 ml  Output 2200 ml  Net -258.69 ml   Filed Weights   12/15/19 1704 12/15/19 2115  Weight: 113.1 kg 119 kg    Examination: Physical Exam  Constitutional: She is well-developed, well-nourished, and in no distress.  HENT:  Head: Normocephalic and atraumatic.  Eyes: Pupils are equal, round, and reactive to light. EOM are normal.  Cardiovascular: Normal rate, regular rhythm, normal heart sounds and intact distal pulses.  Pulmonary/Chest: Effort normal and breath sounds normal.  Musculoskeletal:        General: Normal range of motion.     Cervical back: Normal range of motion and neck supple.  Neurological: She is alert.  Skin: Skin is warm and dry.    Resolved Hospital Problem list   Hypothermia Bradycardia and hypotension  Assessment & Plan:  64 year old female with PMH of bipolar and HTN who presents to PCCM with respiratory failure post benzos and muscle relaxant OD. She is awake and alert on examination with strong cough.   Acute respiratory failure secondary to BZD overdose s/p extubation:  Patient extubated today and is hemodynamically stable, maintaining good oxygen saturation on 2L [place] P:  Titrate O2 for sat 88-92%  1:1 sitter and psychiatry consult for suspected suicidal ideation  Patient does not require ICU level care at this time - [facility] hospitalist to assume patient care  tomorrow   Altered mental status/BZD withdrawal:  In setting of benzo and muscle relaxant overdose  CT head negative for acute intracranial abnormality P:  Will switch ativan to xanax  Delirium precautions   Elevated CK levels: Elevated AST  CK trending up in setting of BZD overdose; renal function wnl P:  Continue IVF with LR @ 100 cc/hr  Hypertension: Patient on atenolol-chlorthalidone at home  P: Labetalol prn  Resume home meds once more awake   Diabetes:  Patient takes metformin 500mg   daily  P:  CBG monitoring   Best practice:  Diet: NPO Pain/Anxiety/Delirium protocol (if indicated): fentanyl and ativan prn  VAP protocol (if indicated): in place DVT prophylaxis: Lovenox GI prophylaxis: Protonix Glucose control: n/a Mobility: bed bound Code Status: FULL Family Communication: will update family  Disposition: Med-surg  Labs   CBC: Recent Labs  Lab 12/15/19 1636 12/15/19 1701 12/15/19 1710 12/16/19 0045 12/16/19 0435 12/17/19 0305  WBC 3.1*  --   --  8.0  --  9.3  HGB 14.9 15.0 13.3 15.2* 15.3* 13.0  HCT 45.7 44.0 39.0 45.7 45.0 39.9  MCV 92.0  --   --  89.4  --  90.7  PLT 207  --   --  321  --  0000000    Basic Metabolic Panel: Recent Labs  Lab 12/15/19 1636 12/15/19 1701 12/15/19 1710 12/15/19 1836 12/16/19 0045 12/16/19 0435 12/17/19 0305  NA 139 139 143  --  147* 146* 142  K 4.9 5.5* 4.1  --  3.7 3.8 3.6  CL 106  --   --   --  113*  --  109  CO2 24  --   --   --  24  --  26  GLUCOSE 152*  --   --   --  128*  --  138*  BUN 10  --   --   --  9  --  8  CREATININE 0.63  --   --   --  0.71  --  0.64  CALCIUM 8.7*  --   --   --  8.7*  --  8.6*  MG  --   --   --  1.9  --   --   --    GFR: Estimated Creatinine Clearance: 100.1 mL/min (by C-G formula based on SCr of 0.64 mg/dL). Recent Labs  Lab 12/15/19 1636 12/15/19 1923 12/16/19 0045 12/17/19 0305  WBC 3.1*  --  8.0 9.3  LATICACIDVEN 2.7* 2.0*  --   --     Liver Function Tests: Recent Labs  Lab 12/15/19 1636 12/16/19 0045 12/17/19 0305  AST 45* 86* 228*  ALT 23 35 54*  ALKPHOS 64 61 52  BILITOT 0.5 0.7 0.8  PROT 6.2* 6.0* 5.9*  ALBUMIN 3.5 3.1* 3.1*   No results for input(s): LIPASE, AMYLASE in the last 168 hours. No results for input(s): AMMONIA in the last 168 hours.  ABG    Component Value Date/Time   PHART 7.392 12/16/2019 0435   PCO2ART 46.6 12/16/2019 0435   PO2ART 126.0 (H) 12/16/2019 0435   HCO3 28.2 (H) 12/16/2019 0435   TCO2 30 12/16/2019 0435   O2SAT 99.0  12/16/2019 0435     Coagulation Profile: Recent Labs  Lab 12/15/19 1636  INR 0.9    Cardiac Enzymes: Recent Labs  Lab 12/15/19 1636 12/16/19 0045  CKTOTAL 2,527* 10,346*    HbA1C: HB A1C (BAYER DCA - WAIVED)  Date/Time Value Ref Range Status  09/30/2017 09:33 AM 6.3 <7.0 % Final    Comment:                                          Diabetic Adult            <7.0                                       Healthy Adult        4.3 - 5.7                                                           (DCCT/NGSP) American Diabetes Association's Summary of Glycemic Recommendations for Adults with Diabetes: Hemoglobin A1c <7.0%. More stringent glycemic goals (A1c <6.0%) may further reduce complications at the cost of increased risk of hypoglycemia.   06/10/2017 09:00 AM 6.3 <7.0 % Final    Comment:                                          Diabetic Adult            <7.0                                       Healthy Adult        4.3 - 5.7                                                           (DCCT/NGSP) American Diabetes Association's Summary of Glycemic Recommendations for Adults with Diabetes: Hemoglobin A1c <7.0%. More stringent glycemic goals (A1c <6.0%) may further reduce complications at the cost of increased risk of hypoglycemia.     CBG: No results for input(s): GLUCAP in the last 168 hours.  Review of Systems:   Unable to attain as patient is on vent.   Past Medical History  She,  has a past medical history of Anxiety, Depression, Hyperlipidemia, and Hypertension.   Surgical History    Past Surgical History:  Procedure Laterality Date  . CERVICAL ABLATION    . CHOLECYSTECTOMY    . TUBAL LIGATION       Social History   reports that she has been smoking e-cigarettes. She has never used smokeless tobacco. She reports that she does not drink alcohol or use drugs.   Family History   Her family history includes ALS in her brother; Anuerysm in her father; Cancer in  her paternal grandfather and paternal grandmother; Diabetes in her brother and mother; Glaucoma in her maternal grandfather and mother; Hypertension in her father and sister.   Allergies Allergies  Allergen Reactions  . Nuedexta [Dextromethorphan-Quinidine] Hives  . Depakene [Valproic Acid] Itching and Rash     Home Medications  Prior to Admission medications   Medication Sig Start Date End Date Taking? Authorizing Provider  ALPRAZolam Duanne Moron) 0.5 MG tablet Take 0.5 mg by mouth at bedtime as needed for anxiety.   Yes [provider]  amLODipine (NORVASC) 5 MG tablet Take 1 tablet (5 mg total) by mouth daily. Patient taking differently: Take 5 mg by mouth at bedtime.  11/04/17  Yes Eustaquio Maize, MD  atenolol-chlorthalidone (TENORETIC) 50-25 MG tablet Take 1 tablet by mouth daily.   Yes [provider]  Eszopiclone (ESZOPICLONE) 3 MG TABS Take 3 mg by mouth at bedtime. Take immediately before bedtime   Yes [provider]  gabapentin (NEURONTIN) 400 MG capsule Take 400 mg by mouth 2 (two) times daily.   Yes [provider]  glucose blood test strip Check BS QD and PRN 03/31/17  Yes Dettinger, Fransisca Kaufmann, MD  lamoTRIgine (LAMICTAL) 200 MG tablet Take 150 mg by mouth daily.    Yes [provider]  metFORMIN (GLUCOPHAGE) 500 MG tablet Take 500 mg by mouth daily.   Yes [provider]  primidone (MYSOLINE) 50 MG tablet Take 50 mg by mouth 2 (two) times daily.   Yes [provider]  sertraline (ZOLOFT) 100 MG tablet Take 100 mg by mouth daily.   Yes [provider]  simvastatin (ZOCOR) 40 MG tablet TAKE 1 TABLET (40 MG TOTAL) BY MOUTH AT BEDTIME. Patient taking differently: Take 40 mg by mouth daily.  09/30/17  Yes Dettinger, Fransisca Kaufmann, MD     Critical care time:   CRITICAL CARE Performed by: Harvie Heck  Total critical care time: 30 minutes  Critical care was necessary to treat or prevent imminent or  life-threatening deterioration. Critical care was time spent personally by me on the following activities: development of treatment plan with patient and/or surrogate as well as nursing, discussions with consultants, evaluation of patient's response to treatment, examination of patient, obtaining history from patient or surrogate, ordering and performing treatments and interventions, ordering and review of laboratory studies, ordering and review of radiographic studies, pulse oximetry and re-evaluation of patient's condition.   Harvie Heck, MD Internal Medicine, PGY-1 Pager #: [phone] 12/17/2019 7:45 AM

## 2019-12-17 NOTE — Plan of Care (Signed)
Pt is alert to pain, disoriented, on 2L [place], and speech is garbled. Pt has a foley in place, urine has become more clean throughout the night. Suicide sitter at bedside, no falls. Pt has been given PRN medication for pain / anxiety. Problem: Education: Goal: Knowledge of General Education information will improve Description: Including pain rating scale, medication(s)/side effects and non-pharmacologic comfort measures Outcome: Progressing   Problem: Clinical Measurements: Goal: Cardiovascular complication will be avoided Outcome: Progressing   Problem: Elimination: Goal: Will not experience complications related to urinary retention Outcome: Progressing   Problem: Safety: Goal: Ability to remain free from injury will improve Outcome: Progressing

## 2019-12-17 NOTE — Plan of Care (Signed)
[facility] pickup from PCCM on 1/11.  See [facility] communication for further details. 

## 2019-12-17 NOTE — Progress Notes (Signed)
Received call from Mr. Beilfuss (husband), updated on wife's health status.  He will f/up in the morning after 9am.

## 2019-12-17 NOTE — Progress Notes (Signed)
Patient right arm looks more swollen and redder. MD Paged and notified.  [facility] here to transport patient  Report called to [place] on 6 E

## 2019-12-18 DIAGNOSIS — T50902A Poisoning by unspecified drugs, medicaments and biological substances, intentional self-harm, initial encounter: Secondary | ICD-10-CM

## 2019-12-18 DIAGNOSIS — R4689 Other symptoms and signs involving appearance and behavior: Secondary | ICD-10-CM

## 2019-12-18 LAB — CBC
HCT: 38 % (ref 36.0–46.0)
Hemoglobin: 12.6 g/dL (ref 12.0–15.0)
MCH: 29.6 pg (ref 26.0–34.0)
MCHC: 33.2 g/dL (ref 30.0–36.0)
MCV: 89.2 fL (ref 80.0–100.0)
Platelets: 249 10*3/uL (ref 150–400)
RBC: 4.26 MIL/uL (ref 3.87–5.11)
RDW: 13 % (ref 11.5–15.5)
WBC: 8.3 10*3/uL (ref 4.0–10.5)
nRBC: 0 % (ref 0.0–0.2)

## 2019-12-18 LAB — COMPREHENSIVE METABOLIC PANEL
ALT: 78 U/L — ABNORMAL HIGH (ref 0–44)
AST: 366 U/L — ABNORMAL HIGH (ref 15–41)
Albumin: 3.2 g/dL — ABNORMAL LOW (ref 3.5–5.0)
Alkaline Phosphatase: 56 U/L (ref 38–126)
Anion gap: 9 (ref 5–15)
BUN: 7 mg/dL — ABNORMAL LOW (ref 8–23)
CO2: 26 mmol/L (ref 22–32)
Calcium: 9 mg/dL (ref 8.9–10.3)
Chloride: 105 mmol/L (ref 98–111)
Creatinine, Ser: 0.66 mg/dL (ref 0.44–1.00)
GFR calc Af Amer: >60 mL/min (ref >60)
GFR calc non Af Amer: >60 mL/min (ref >60)
Glucose, Bld: 137 mg/dL — ABNORMAL HIGH (ref 70–99)
Potassium: 3.8 mmol/L (ref 3.5–5.1)
Sodium: 140 mmol/L (ref 135–145)
Total Bilirubin: 1.4 mg/dL — ABNORMAL HIGH (ref 0.3–1.2)
Total Protein: 6.1 g/dL — ABNORMAL LOW (ref 6.5–8.1)

## 2019-12-18 LAB — CK: Total CK: 18884 U/L — ABNORMAL HIGH (ref 38–234)

## 2019-12-18 MED ORDER — ACETAMINOPHEN 325 MG PO TABS: 650.0000 mg | ORAL_TABLET | Freq: Once | ORAL | Status: DC

## 2019-12-18 MED ORDER — ACETAMINOPHEN 650 MG RE SUPP
650.0000 mg | Freq: Once | RECTAL | Status: AC
  Administered 2019-12-18: 650 mg via RECTAL
  Filled 2019-12-18 (×2): qty 1

## 2019-12-18 NOTE — Evaluation (Signed)
Clinical/Bedside Swallow Evaluation Patient Details  Name: Debbie Steele MRN: [id number] Date of Birth: [birth year]  Today's Date: 12/18/2019 Time: SLP Start Time (ACUTE ONLY): 35 SLP Stop Time (ACUTE ONLY): 1238 SLP Time Calculation (min) (ACUTE ONLY): 18 min  Past Medical History:  Past Medical History:  Diagnosis Date  . Anxiety   . Depression   . Hyperlipidemia   . Hypertension    Past Surgical History:  Past Surgical History:  Procedure Laterality Date  . CERVICAL ABLATION    . CHOLECYSTECTOMY    . TUBAL LIGATION     HPI:  64 year old female who was critically ill due to acute respiratory failure requiring mechanical ventilation following a Xanax and muscle relaxant overdose. Intubated 1/8, she was extubated 1/9. She continues to be somnolent but arousable.    Assessment / Plan / Recommendation Clinical Impression  Pt presents with a current cognitive based dysphagia; mentation waxing and waning. With cues, pt able to follow some simple commands inconsistently. Of note, pt with frequent dry, unproductive cough prior to POs and during clinical swallow evaluation. Cough did not appear to increase nor decrease with PO consumption.  Pt with palpable strong swallow with POs, vocal quality remained clear during brief pt interactions with SLP. Oral tranist mildly delayed with solid POs, upper dentures only noted with edentulous lower oral cavity. Continue NPO with ice chips/ water following oral care when mentation permits and  medicines whole in puree. SLP to follow up for diet readiness.      SLP Visit Diagnosis: Dysphagia, unspecified (R13.10)    Aspiration Risk  Mild aspiration risk;Moderate aspiration risk    Diet Recommendation   NPO; water/ice chips following oral care  Medication Administration: Whole meds with puree    Other  Recommendations Oral Care Recommendations: Oral care QID;Oral care prior to ice chip/H20   Follow up Recommendations 24 hour  supervision/assistance      Frequency and Duration min 2x/week  1 week       Prognosis Prognosis for Safe Diet Advancement: Good Barriers to Reach Goals: Time post onset      Swallow Study   General Date of Onset: 12/15/19 HPI: 64 year old female who was critically ill due to acute respiratory failure requiring mechanical ventilation following a Xanax and muscle relaxant overdose. Intubated 1/8, she was extubated 1/9. She continues to be somnolent but arousable.  Type of Study: Bedside Swallow Evaluation Previous Swallow Assessment: none on file Diet Prior to this Study: NPO Temperature Spikes Noted: No Respiratory Status: Room air History of Recent Intubation: Yes Length of Intubations (days): 1 days Date extubated: 12/16/19 Behavior/Cognition: Lethargic/Drowsy;Requires cueing Oral Cavity Assessment: Within Functional Limits Oral Care Completed by SLP: No Oral Cavity - Dentition: Dentures, top(edentulous lower) Vision: Impaired for self-feeding Self-Feeding Abilities: Needs assist Patient Positioning: Upright in bed Baseline Vocal Quality: Low vocal intensity Volitional Cough: Cognitively unable to elicit Volitional Swallow: Unable to elicit    Oral/Motor/Sensory Function Overall Oral Motor/Sensory Function: Within functional limits   Ice Chips Ice chips: Impaired Presentation: Spoon Oral Phase Functional Implications: Prolonged oral transit Pharyngeal Phase Impairments: Suspected delayed Swallow;Multiple swallows;Cough - Delayed   Thin Liquid Thin Liquid: Impaired Presentation: Cup;Straw Oral Phase Functional Implications: Prolonged oral transit Pharyngeal  Phase Impairments: Suspected delayed Swallow;Multiple swallows;Cough - Delayed    Nectar Thick Nectar Thick Liquid: Not tested   Honey Thick Honey Thick Liquid: Not tested   Puree Puree: Impaired Presentation: Spoon Oral Phase Functional Implications: Prolonged oral transit Pharyngeal Phase  Impairments:  Suspected delayed Swallow;Multiple swallows;Cough - Delayed   Solid     Solid: Impaired Presentation: Spoon Oral Phase Impairments: Impaired mastication Oral Phase Functional Implications: Prolonged oral transit Pharyngeal Phase Impairments: Suspected delayed Swallow;Multiple swallows;Cough - Delayed      Njeri Vicente E Rodd Heft MA, CCC-SLP Acute Rehabilitation Services 12/18/2019,12:56 PM

## 2019-12-18 NOTE — Progress Notes (Signed)
Janett Billow from [facility] control called to be updated about patient. Discussed patient's vitals, LOC, labs and medications given.  [phone]
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
## 2019-12-18 NOTE — Progress Notes (Signed)
Patient's rectal temperature 100.0. MD notified and one time dose of tylenol ordered

## 2019-12-19 LAB — COMPREHENSIVE METABOLIC PANEL
ALT: 84 U/L — ABNORMAL HIGH (ref 0–44)
AST: 287 U/L — ABNORMAL HIGH (ref 15–41)
Albumin: 3.1 g/dL — ABNORMAL LOW (ref 3.5–5.0)
Alkaline Phosphatase: 55 U/L (ref 38–126)
Anion gap: 12 (ref 5–15)
BUN: 11 mg/dL (ref 8–23)
CO2: 22 mmol/L (ref 22–32)
Calcium: 8.9 mg/dL (ref 8.9–10.3)
Chloride: 109 mmol/L (ref 98–111)
Creatinine, Ser: 0.64 mg/dL (ref 0.44–1.00)
GFR calc Af Amer: >60 mL/min (ref >60)
GFR calc non Af Amer: >60 mL/min (ref >60)
Glucose, Bld: 120 mg/dL — ABNORMAL HIGH (ref 70–99)
Potassium: 3.5 mmol/L (ref 3.5–5.1)
Sodium: 143 mmol/L (ref 135–145)
Total Bilirubin: 1.5 mg/dL — ABNORMAL HIGH (ref 0.3–1.2)
Total Protein: 6.2 g/dL — ABNORMAL LOW (ref 6.5–8.1)

## 2019-12-19 LAB — CBC
HCT: 40.4 % (ref 36.0–46.0)
Hemoglobin: 13.5 g/dL (ref 12.0–15.0)
MCH: 30 pg (ref 26.0–34.0)
MCHC: 33.4 g/dL (ref 30.0–36.0)
MCV: 89.8 fL (ref 80.0–100.0)
Platelets: 270 10*3/uL (ref 150–400)
RBC: 4.5 MIL/uL (ref 3.87–5.11)
RDW: 13.1 % (ref 11.5–15.5)
WBC: 7.3 10*3/uL (ref 4.0–10.5)
nRBC: 0 % (ref 0.0–0.2)

## 2019-12-19 LAB — CK: Total CK: 12856 U/L — ABNORMAL HIGH (ref 38–234)

## 2019-12-19 NOTE — Progress Notes (Signed)
SLP Cancellation Note  Patient Details Name: CHRISTOL RAZVI MRN: [id number] DOB: 1956-07-09   Cancelled treatment:       Reason Eval/Treat Not Completed: Patient at procedure or test/unavailable.  NT had just started to clean pt following urination upon SLP arrival.  SLP will f/u as schedule allows.     Elvia Collum Marisel Tostenson 12/19/2019, 3:54 PM

## 2019-12-19 NOTE — Care Management Important Message (Signed)
Important Message  Patient Details  Name: KEILEI GREENHALGH MRN: [id number] Date of Birth: [birth year]   Medicare Important Message Given:  Yes     Shelda Altes 12/19/2019, 11:25 AM
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
## 2019-12-19 NOTE — Progress Notes (Signed)
Debbie Steele from [place] control called to get update for patient.  Provided last V/S, medication given at this time, and patient LOC.  Debbie Steele stated that patient may be withdrawing from benzos since she has not been getting her regular dose at this time.  Xanax is scheduled for patient at night but was not able to take last night.  Stated to [place] that I will try to give medication tonight but Ativan as needed. [phone]

## 2019-12-20 DIAGNOSIS — J9600 Acute respiratory failure, unspecified whether with hypoxia or hypercapnia: Secondary | ICD-10-CM

## 2019-12-20 LAB — COMPREHENSIVE METABOLIC PANEL
ALT: 83 U/L — ABNORMAL HIGH (ref 0–44)
AST: 176 U/L — ABNORMAL HIGH (ref 15–41)
Albumin: 3.3 g/dL — ABNORMAL LOW (ref 3.5–5.0)
Alkaline Phosphatase: 57 U/L (ref 38–126)
Anion gap: 11 (ref 5–15)
BUN: 12 mg/dL (ref 8–23)
CO2: 24 mmol/L (ref 22–32)
Calcium: 9.1 mg/dL (ref 8.9–10.3)
Chloride: 111 mmol/L (ref 98–111)
Creatinine, Ser: 0.74 mg/dL (ref 0.44–1.00)
GFR calc Af Amer: >60 mL/min (ref >60)
GFR calc non Af Amer: >60 mL/min (ref >60)
Glucose, Bld: 115 mg/dL — ABNORMAL HIGH (ref 70–99)
Potassium: 3.5 mmol/L (ref 3.5–5.1)
Sodium: 146 mmol/L — ABNORMAL HIGH (ref 135–145)
Total Bilirubin: 1.7 mg/dL — ABNORMAL HIGH (ref 0.3–1.2)
Total Protein: 6.7 g/dL (ref 6.5–8.1)

## 2019-12-20 LAB — CULTURE, BLOOD (ROUTINE X 2)
Culture: NO GROWTH
Culture: NO GROWTH
Special Requests: ADEQUATE
Special Requests: ADEQUATE

## 2019-12-20 LAB — CK: Total CK: 7472 U/L — ABNORMAL HIGH (ref 38–234)

## 2019-12-20 MED ORDER — SODIUM CHLORIDE 0.9% FLUSH
10.0000 mL | Freq: Two times a day (BID) | INTRAVENOUS | Status: DC
  Administered 2019-12-20 – 2019-12-26 (×10): 10 mL

## 2019-12-20 MED ORDER — AMLODIPINE BESYLATE 5 MG PO TABS
5.0000 mg | ORAL_TABLET | Freq: Every day | ORAL | Status: DC
  Administered 2019-12-20: 5 mg via ORAL
  Filled 2019-12-20: qty 1

## 2019-12-20 MED ORDER — LAMOTRIGINE 100 MG PO TABS
150.0000 mg | ORAL_TABLET | Freq: Every day | ORAL | Status: DC
  Administered 2019-12-20 – 2019-12-26 (×7): 150 mg via ORAL
  Filled 2019-12-20 (×7): qty 2

## 2019-12-20 MED ORDER — ATENOLOL-CHLORTHALIDONE 50-25 MG PO TABS: 1.0000 | ORAL_TABLET | Freq: Every day | ORAL | Status: DC

## 2019-12-20 MED ORDER — SODIUM CHLORIDE 0.9% FLUSH: 10.0000 mL | INTRAVENOUS | Status: DC | PRN

## 2019-12-20 MED ORDER — GABAPENTIN 400 MG PO CAPS
400.0000 mg | ORAL_CAPSULE | Freq: Two times a day (BID) | ORAL | Status: DC
  Administered 2019-12-20 – 2019-12-26 (×13): 400 mg via ORAL
  Filled 2019-12-20 (×13): qty 1

## 2019-12-20 MED ORDER — CHLORTHALIDONE 25 MG PO TABS
25.0000 mg | ORAL_TABLET | Freq: Every day | ORAL | Status: DC
  Administered 2019-12-21 – 2019-12-26 (×6): 25 mg via ORAL
  Filled 2019-12-20 (×6): qty 1

## 2019-12-20 MED ORDER — SERTRALINE HCL 100 MG PO TABS
100.0000 mg | ORAL_TABLET | Freq: Every day | ORAL | Status: DC
  Administered 2019-12-20 – 2019-12-26 (×7): 100 mg via ORAL
  Filled 2019-12-20 (×7): qty 1

## 2019-12-20 MED ORDER — ATENOLOL 25 MG PO TABS
50.0000 mg | ORAL_TABLET | Freq: Every day | ORAL | Status: DC
  Administered 2019-12-20 – 2019-12-23 (×4): 50 mg via ORAL
  Filled 2019-12-20 (×4): qty 2

## 2019-12-20 MED ORDER — PRIMIDONE 50 MG PO TABS
50.0000 mg | ORAL_TABLET | Freq: Two times a day (BID) | ORAL | Status: DC
  Administered 2019-12-20 – 2019-12-26 (×13): 50 mg via ORAL
  Filled 2019-12-20 (×14): qty 1

## 2019-12-20 NOTE — Consult Note (Signed)
Telepsych Consultation   Reason for Consult:  " Intentional overdose" Referring Physician:  Dr Reesa Chew Location of Patient: [facility] [id number] Location of Provider: [facility]  Patient Identification: JENEICE RISINGER MRN:  [id number] Principal Diagnosis: <principal problem not specified> Diagnosis:  Active Problems:   Acute respiratory failure ([place])   Intentional drug overdose ([place])   Altered behavior   Total Time spent with patient: 30 minutes  Subjective:   SAMYUKTA ORREN is a 64 y.o. female patient admitted with intentional overdose.  Patient assessed by nurse practitioner.  Patient alert and oriented, answers appropriately.  Patient's husband, called, at bedside during assessment.  Patient gives verbal consent to complete assessment with husband present. Patient states "I did not care anymore, most of the time my sister triggers it."  Patient referring to intentional overdose.  Patient reports history of 4 suicide attempts during her lifetime.  Patient reports last inpatient psychiatric treatment in 1997.  Patient denies homicidal ideations.  Patient denies auditory and visual hallucinations.  Patient denies access to weapons.  Patient reports she lives with her husband and feels safe at home.  Patient disabled related to her bipolar diagnosis.  Patient followed by outpatient psychiatrist Dr. Antionette Char in [place].  Patient treated for bipolar disorder, home medications include gabapentin, lithium and Xanax.  Patient denies alcohol and substance use.  Patient gives verbal consent to collect collateral information from husband, Clance Boll. Patient's husband called states: "I am always concerned about her safety."  Patient's husband denies weapons in home.  Patient's husband reports "we do not want her to go to [facility] [place] she had a bad experience there."  Case discussed with Dr. Dwyane Dee.  Inpatient psychiatric treatment recommended.   HPI: Patient admitted following  intentional overdose.  Past Psychiatric History: Bipolar Disorder  Risk to Self:  Yes  Risk to Others:  Denies Prior Inpatient Therapy:  Yes Prior Outpatient Therapy:  Yes  Past Medical History:  Past Medical History:  Diagnosis Date  . Anxiety   . Depression   . Hyperlipidemia   . Hypertension     Past Surgical History:  Procedure Laterality Date  . CERVICAL ABLATION    . CHOLECYSTECTOMY    . TUBAL LIGATION     Family History:  Family History  Problem Relation Age of Onset  . Diabetes Mother   . Glaucoma Mother   . Hypertension Father   . Anuerysm Father   . Hypertension Sister   . Diabetes Brother   . ALS Brother   . Glaucoma Maternal Grandfather   . Cancer Paternal Grandmother   . Cancer Paternal Grandfather    Family Psychiatric  History: Unknown Social History:  Social History   Substance and Sexual Activity  Alcohol Use No     Social History   Substance and Sexual Activity  Drug Use No    Social History   Socioeconomic History  . Marital status: Married    Spouse name: Not on file  . Number of children: Not on file  . Years of education: Not on file  . Highest education level: Not on file  Occupational History  . Not on file  Tobacco Use  . Smoking status: Current Every Day Smoker    Types: E-cigarettes  . Smokeless tobacco: Never Used  . Tobacco comment: E-Cigs  Substance and Sexual Activity  . Alcohol use: No  . Drug use: No  . Sexual activity: Not on file  Other Topics Concern  . Not on  file  Social History Narrative  . Not on file   Social Determinants of Health   Financial Resource Strain:   . Difficulty of Paying Living Expenses: Not on file  Food Insecurity:   . Worried About Charity fundraiser in the Last Year: Not on file  . Ran Out of Food in the Last Year: Not on file  Transportation Needs:   . Lack of Transportation (Medical): Not on file  . Lack of Transportation (Non-Medical): Not on file  Physical Activity:   .  Days of Exercise per Week: Not on file  . Minutes of Exercise per Session: Not on file  Stress:   . Feeling of Stress : Not on file  Social Connections:   . Frequency of Communication with Friends and Family: Not on file  . Frequency of Social Gatherings with Friends and Family: Not on file  . Attends Religious Services: Not on file  . Active Member of Clubs or Organizations: Not on file  . Attends Archivist Meetings: Not on file  . Marital Status: Not on file   Additional Social History:    Allergies:   Allergies  Allergen Reactions  . Nuedexta [Dextromethorphan-Quinidine] Hives  . Depakene [Valproic Acid] Itching and Rash    Labs:  Results for orders placed or performed during the hospital encounter of 12/15/19 (from the past 48 hour(s))  CK     Status: Abnormal   Collection Time: 12/19/19  4:29 AM  Result Value Ref Range   Total CK 12,856 (H) 38 - 234 U/L    Comment: RESULTS CONFIRMED BY MANUAL DILUTION Performed at [facility], [address] [place], [place] [address]   Comprehensive metabolic panel     Status: Abnormal   Collection Time: 12/19/19  4:29 AM  Result Value Ref Range   Sodium 143 135 - 145 mmol/L   Potassium 3.5 3.5 - 5.1 mmol/L   Chloride 109 98 - 111 mmol/L   CO2 22 22 - 32 mmol/L   Glucose, Bld 120 (H) 70 - 99 mg/dL   BUN 11 8 - 23 mg/dL   Creatinine, Ser 0.64 0.44 - 1.00 mg/dL   Calcium 8.9 8.9 - 10.3 mg/dL   Total Protein 6.2 (L) 6.5 - 8.1 g/dL   Albumin 3.1 (L) 3.5 - 5.0 g/dL   AST 287 (H) 15 - 41 U/L   ALT 84 (H) 0 - 44 U/L   Alkaline Phosphatase 55 38 - 126 U/L   Total Bilirubin 1.5 (H) 0.3 - 1.2 mg/dL   GFR calc non Af Amer >60 >60 mL/min   GFR calc Af Amer >60 >60 mL/min   Anion gap 12 5 - 15    Comment: Performed at [facility], [place] [address], [place] [address]  CBC     Status: None   Collection Time: 12/19/19  4:29 AM  Result Value Ref Range   WBC 7.3 4.0 - 10.5 K/uL   RBC 4.50 3.87 - 5.11 MIL/uL    Hemoglobin 13.5 12.0 - 15.0 g/dL   HCT 40.4 36.0 - 46.0 %   MCV 89.8 80.0 - 100.0 fL   MCH 30.0 26.0 - 34.0 pg   MCHC 33.4 30.0 - 36.0 g/dL   RDW 13.1 11.5 - 15.5 %   Platelets 270 150 - 400 K/uL   nRBC 0.0 0.0 - 0.2 %    Comment: Performed at [facility], [place] [address] [place], [place]  [id number]  CK     Status: Abnormal   Collection Time: 12/20/19  3:46 AM  Result Value Ref Range   Total CK 7,472 (H) 38 - 234 U/L    Comment: RESULTS CONFIRMED BY MANUAL DILUTION Performed at [facility], [place] [address] [place], [place] [address]   Comprehensive metabolic panel     Status: Abnormal   Collection Time: 12/20/19  3:46 AM  Result Value Ref Range   Sodium 146 (H) 135 - 145 mmol/L   Potassium 3.5 3.5 - 5.1 mmol/L   Chloride 111 98 - 111 mmol/L   CO2 24 22 - 32 mmol/L   Glucose, Bld 115 (H) 70 - 99 mg/dL   BUN 12 8 - 23 mg/dL   Creatinine, Ser 0.74 0.44 - 1.00 mg/dL   Calcium 9.1 8.9 - 10.3 mg/dL   Total Protein 6.7 6.5 - 8.1 g/dL   Albumin 3.3 (L) 3.5 - 5.0 g/dL   AST 176 (H) 15 - 41 U/L   ALT 83 (H) 0 - 44 U/L   Alkaline Phosphatase 57 38 - 126 U/L   Total Bilirubin 1.7 (H) 0.3 - 1.2 mg/dL   GFR calc non Af Amer >60 >60 mL/min   GFR calc Af Amer >60 >60 mL/min   Anion gap 11 5 - 15    Comment: Performed at [facility], [place] [address], [place], [place] [address]    Medications:  Current Facility-Administered Medications  Medication Dose Route Frequency Provider Last Rate Last Admin  . acetaminophen (TYLENOL) tablet 650 mg  650 mg Oral Once Lorella Nimrod, MD      . ALPRAZolam Duanne Moron) tablet 0.5 mg  0.5 mg Oral QHS Harvie Heck, MD   0.5 mg at 12/19/19 2207  . bisacodyl (DULCOLAX) suppository 10 mg  10 mg Rectal Daily PRN Chesley Mires, MD      . Chlorhexidine Gluconate Cloth 2 % PADS 6 each  6 each Topical Daily Rush Farmer, MD   6 each at 12/19/19 1540  . docusate (COLACE) 50 MG/5ML liquid 100 mg  100 mg Per Tube BID PRN Chesley Mires, MD      .  enoxaparin (LOVENOX) injection 40 mg  40 mg Subcutaneous QHS Weaver, Scott T, PA-C   40 mg at 12/19/19 2208  . labetalol (NORMODYNE) injection 10 mg  10 mg Intravenous Q2H PRN Harvie Heck, MD   10 mg at 12/18/19 0526  . lactated ringers infusion   Intravenous Continuous Harvie Heck, MD 125 mL/hr at 12/20/19 0302 New Bag at 12/20/19 0302  . LORazepam (ATIVAN) injection 2 mg  2 mg Intravenous Q1H PRN Kipp Brood, MD   2 mg at 12/20/19 0255  . MEDLINE mouth rinse  15 mL Mouth Rinse BID Agarwala, Ravi, MD   15 mL at 12/17/19 1035  . sodium chloride flush (NS) 0.9 % injection 10-40 mL  10-40 mL Intracatheter Q12H Amin, Soundra Pilon, MD      . sodium chloride flush (NS) 0.9 % injection 10-40 mL  10-40 mL Intracatheter PRN Lorella Nimrod, MD        Musculoskeletal: Strength & Muscle Tone: unable to assess Gait & Station: unable to assess Patient leans: unable to assess  Psychiatric Specialty Exam: Physical Exam  Nursing note and vitals reviewed. Constitutional: She is oriented to person, place, and time. She appears well-developed.  HENT:  Head: Normocephalic.  Cardiovascular: Normal rate.  Respiratory: Effort normal.  Neurological: She is alert and oriented to person,  place, and time.  Psychiatric: Her speech is normal and behavior is normal. Judgment and thought content normal. Cognition and memory are normal. She exhibits a depressed mood.    Review of Systems  Constitutional: Negative.   HENT: Negative.   Eyes: Negative.   Respiratory: Negative.   Cardiovascular: Negative.   Gastrointestinal: Negative.   Genitourinary: Negative.   Musculoskeletal: Negative.   Skin: Negative.   Neurological: Negative.     Blood pressure (!) 159/68, pulse 93, temperature 98.6 F (37 C), temperature source Oral, resp. rate (P) 15, height 5\' 8"  (1.727 m), weight 119 kg, last menstrual period 04/24/2014, SpO2 96 %.Body mass index is 39.89 kg/m.  General Appearance: Casual  Eye Contact:  Fair  Speech:   Clear and Coherent and Normal Rate  Volume:  Normal  Mood:  Depressed  Affect:  Depressed  Thought Process:  Coherent, Goal Directed and Descriptions of Associations: Intact  Orientation:  Full (Time, Place, and Person)  Thought Content:  WDL and Logical  Suicidal Thoughts:  No  Homicidal Thoughts:  No  Memory:  Immediate;   Good Recent;   Good Remote;   Good  Judgement:  Fair  Insight:  Fair  Psychomotor Activity:  Normal     Recall:  Good  Fund of Knowledge:  Good  Language:  Good  Akathisia:  No  Handed:  Right  AIMS (if indicated):     Assets:  Communication Skills Desire for Improvement Financial Resources/Insurance Housing Intimacy Leisure Time Physical Health Resilience Social Support  ADL's:  Intact  Cognition:  WNL  Sleep:        Treatment Plan Summary: Plan Inpatient psychiatric treatment recommended.  Disposition: Recommend psychiatric Inpatient admission when medically cleared. Supportive therapy provided about ongoing stressors. Discussed crisis plan, support from social network, calling 911, coming to the Emergency Department, and calling Suicide Hotline.  This service was provided via telemedicine using a 2-way, interactive audio and video technology.  Names of all persons participating in this telemedicine service and their role in this encounter. Name: [place] Contreraz Role: Patient  Name: Hassan Rowan Role: Patient's husband  Name: Letitia Libra Role: [place], [place] 12/20/2019 12:21 PM

## 2019-12-20 NOTE — Progress Notes (Addendum)
  Speech Language Pathology Treatment: Dysphagia  Patient Details Name: Debbie Steele MRN: [id number] DOB: 1956-06-12 Today's Date: 12/20/2019 Time: [id number] SLP Time Calculation (min) (ACUTE ONLY): 31 min  Assessment / Plan / Recommendation Clinical Impression  Pt seen at bedside for dysphagia treatment. Patient presents with cognitive-based dysphagia. HOB raised. Pt's husband present during session. Patient with waxing/waning attention, demonstrating impulsivity, attempting to get out of bed a few times during session, responsive to redirection. Patient w/ upper dentures, no lower dentition.  Patient with somewhat frequent unproductive cough during session (without POs). SLP able to elicit strong cough. Pt seen with ice chips, adequate oral acceptance, slightly delayed AP transfer, adequate mastication. Hyolaryngeal elevation was observed. Pt with immediate dry cough following swallowing in 2/4 opportunities. Pt seen with cup sips of thin liquids. Patient with immediate cough following cup sips in 2/7 opportunities, one instance was following a large cup sip. Pt seen with spoon and cup sips of NTL. Patient with delayed (dry) cough following 2/5 sips. Patient tolerated small bites of puree (applesauce) well, no overt s/s aspiration.  Recommend dysphagia 1 diet with thin liquids. No straws. Only small sips of liquids at a time. Patient should eat/drink when sitting upright, alert and able to attend to food/drink. She should be supervised. Recommend oral suction be placed in room. Patient and husband educated re: recommendations. Husband verbalized understanding.  ST to follow. RN and NT edu re: diet recommendations.Sign placed in patient's room.   HPI HPI: 64 year old female who was critically ill due to acute respiratory failure requiring mechanical ventilation following a Xanax and muscle relaxant overdose. Intubated 1/8, she was extubated 1/9. She continues to be somnolent but arousable.        SLP Plan  Continue with current plan of care       Recommendations  Diet recommendations: Dysphagia 1 (puree) Liquids provided via: Teaspoon;No straw;Cup Medication Administration: Whole meds with puree Supervision: Staff to assist with self feeding;Full supervision/cueing for compensatory strategies;Trained caregiver to feed patient Compensations: Minimize environmental distractions;Slow rate;Small sips/bites;Lingual sweep for clearance of pocketing Postural Changes and/or Swallow Maneuvers: Seated upright 90 degrees;Upright 30-60 min after meal                Oral Care Recommendations: Oral care QID;Oral care prior to ice chip/H20 Follow up Recommendations: 24 hour supervision/assistance SLP Visit Diagnosis: Dysphagia, unspecified (R13.10) Plan: Continue with current plan of care       [place], M.Ed., CCC-SLP Speech Therapy Acute Rehabilitation [place] 12/20/2019, 12:42 PM
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
## 2019-12-20 NOTE — Progress Notes (Signed)
Pt was able to request a bedpan to urinate. She knew her name and birthdate.
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
## 2019-12-20 NOTE — Progress Notes (Signed)
Poison control called to check status of patient- after reviewing vitals and pt overall status- she suggested to give Xanax tonight if pt is able to take it due to possible benzo withdrawal.  2200 I was able to give pt scheduled Xanax in a small amount of applesauce.

## 2019-12-21 LAB — CK: Total CK: 1985 U/L — ABNORMAL HIGH (ref 38–234)

## 2019-12-21 MED ORDER — AMLODIPINE BESYLATE 10 MG PO TABS
10.0000 mg | ORAL_TABLET | Freq: Every day | ORAL | Status: DC
  Administered 2019-12-21 – 2019-12-25 (×5): 10 mg via ORAL
  Filled 2019-12-21 (×5): qty 1

## 2019-12-21 MED ORDER — KETOROLAC TROMETHAMINE 30 MG/ML IJ SOLN
30.0000 mg | Freq: Three times a day (TID) | INTRAMUSCULAR | Status: AC | PRN
  Administered 2019-12-21: 30 mg via INTRAVENOUS
  Filled 2019-12-21: qty 1

## 2019-12-21 MED ORDER — TRAMADOL HCL 50 MG PO TABS
50.0000 mg | ORAL_TABLET | Freq: Four times a day (QID) | ORAL | Status: DC | PRN
  Administered 2019-12-21 – 2019-12-25 (×8): 50 mg via ORAL
  Filled 2019-12-21 (×8): qty 1

## 2019-12-21 MED ORDER — TRAMADOL HCL 50 MG PO TABS
50.0000 mg | ORAL_TABLET | Freq: Once | ORAL | Status: AC
  Administered 2019-12-21: 50 mg via ORAL
  Filled 2019-12-21: qty 1

## 2019-12-21 NOTE — Progress Notes (Signed)
  Speech Language Pathology Treatment: Dysphagia  Patient Details Name: Debbie Steele MRN: [id number] DOB: [birth year] Today's Date: 12/21/2019 Time: [id number] SLP Time Calculation (min) (ACUTE ONLY): 25 min  Assessment / Plan / Recommendation Clinical Impression  Patient seen at bedside for ST. Pt presents with cognitive dysphagia. Patient w/ improved alertness, less impulsivity compared to session yesterday. ST provided oral care via toothbrush w/ in-line suction. Dentures, upper and edentulous lower. Pt reporting she had lower dentures placed in her mouth (she did not). Upper dentures left in pt mouth at end of session.  Pt seen with thin liquids (water, unsweetened tea, caffeine-free coca-cola) via cup and straw sips. Pt with good labial seal of straw, swallow initiation appeared timely, no overt s/s aspiration or distress. Pt seen with puree solids: mashed potatoes, pureed carrots. Good bolus acceptance, bolus manipulation, swallow appeared timely with no oral residue noted. Pt becoming more fatigued as session continued, closing eyes, but able to be roused with verbal cues to attend. SLP trialed soft solids (chopped peaches) and verbally cued patient to chew thoroughly before swallowing. Pt with prolonged mastication and reduced bolus awareness resulting in immediate cough following 1/2 trials of peaches. Pt beginning to close eyes and appearing sleepy, thus PO trials stopped. Recommend continue dysphagia 1/thin liquids diet. Straws OK. ST to follow, anticipate upgrade of solids texture soon as mentation continues to improve.    HPI HPI: 64 year old female who was critically ill due to acute respiratory failure requiring mechanical ventilation following a Xanax and muscle relaxant overdose. Intubated 1/8, she was extubated 1/9. She continues to be somnolent but arousable.       SLP Plan  Continue with current plan of care       Recommendations  Diet recommendations: Dysphagia 1  (puree);Thin liquid Liquids provided via: Cup;Straw Medication Administration: Whole meds with puree Supervision: Staff to assist with self feeding;Full supervision/cueing for compensatory strategies Compensations: Minimize environmental distractions;Slow rate;Small sips/bites;Lingual sweep for clearance of pocketing Postural Changes and/or Swallow Maneuvers: Seated upright 90 degrees;Upright 30-60 min after meal                Oral Care Recommendations: Oral care BID Follow up Recommendations: 24 hour supervision/assistance SLP Visit Diagnosis: Dysphagia, unspecified (R13.10) Plan: Continue with current plan of care       [place], M.Ed., CCC-SLP Speech Therapy Acute Rehabilitation [phone]   Marina Goodell 12/21/2019, 3:20 PM

## 2019-12-21 NOTE — Progress Notes (Signed)
Just came back from short break, Maggie NT, was covering for me. That is why 2300 check  was not charted.

## 2019-12-21 NOTE — Social Work (Signed)
CSW aware pt stable for [facility] discharge when placement found. Pt will be difficult placement due to mobility challenges at this time. CSW has made referral to [facility], [facility], and [facility].  Westley Hummer, MSW, [place] Work
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
## 2019-12-21 NOTE — Progress Notes (Signed)
Notified by Central tele that pt was brady for 2.6 sec with HR 33- pt is asymptomatic- will continue to monitor closely

## 2019-12-22 ENCOUNTER — Inpatient Hospital Stay (HOSPITAL_COMMUNITY): Admit: 2019-12-22 | Payer: Self-pay | Admitting: Psychiatry

## 2019-12-22 LAB — COMPREHENSIVE METABOLIC PANEL
ALT: 41 U/L (ref 0–44)
AST: 39 U/L (ref 15–41)
Albumin: 2.6 g/dL — ABNORMAL LOW (ref 3.5–5.0)
Alkaline Phosphatase: 47 U/L (ref 38–126)
Anion gap: 9 (ref 5–15)
BUN: 9 mg/dL (ref 8–23)
CO2: 28 mmol/L (ref 22–32)
Calcium: 8.3 mg/dL — ABNORMAL LOW (ref 8.9–10.3)
Chloride: 104 mmol/L (ref 98–111)
Creatinine, Ser: 0.75 mg/dL (ref 0.44–1.00)
GFR calc Af Amer: >60 mL/min (ref >60)
GFR calc non Af Amer: >60 mL/min (ref >60)
Glucose, Bld: 120 mg/dL — ABNORMAL HIGH (ref 70–99)
Potassium: 2.9 mmol/L — ABNORMAL LOW (ref 3.5–5.1)
Sodium: 141 mmol/L (ref 135–145)
Total Bilirubin: 0.7 mg/dL (ref 0.3–1.2)
Total Protein: 5.2 g/dL — ABNORMAL LOW (ref 6.5–8.1)

## 2019-12-22 LAB — CBC
HCT: 37.2 % (ref 36.0–46.0)
Hemoglobin: 12 g/dL (ref 12.0–15.0)
MCH: 29.1 pg (ref 26.0–34.0)
MCHC: 32.3 g/dL (ref 30.0–36.0)
MCV: 90.1 fL (ref 80.0–100.0)
Platelets: 274 10*3/uL (ref 150–400)
RBC: 4.13 MIL/uL (ref 3.87–5.11)
RDW: 13.3 % (ref 11.5–15.5)
WBC: 5.2 10*3/uL (ref 4.0–10.5)
nRBC: 0 % (ref 0.0–0.2)

## 2019-12-22 LAB — CK: Total CK: 856 U/L — ABNORMAL HIGH (ref 38–234)

## 2019-12-22 LAB — MAGNESIUM: Magnesium: 1.8 mg/dL (ref 1.7–2.4)

## 2019-12-22 MED ORDER — PNEUMOCOCCAL VAC POLYVALENT 25 MCG/0.5ML IJ INJ
0.5000 mL | INJECTION | INTRAMUSCULAR | Status: AC
  Administered 2019-12-23: 0.5 mL via INTRAMUSCULAR
  Filled 2019-12-22: qty 0.5

## 2019-12-22 MED ORDER — POTASSIUM CHLORIDE CRYS ER 20 MEQ PO TBCR
40.0000 meq | EXTENDED_RELEASE_TABLET | ORAL | Status: AC
  Administered 2019-12-22 (×2): 40 meq via ORAL
  Filled 2019-12-22 (×2): qty 2

## 2019-12-22 NOTE — Evaluation (Signed)
Occupational Therapy Evaluation Patient Details Name: Debbie Steele MRN: [id number] DOB: [birth year] Today's Date: 12/22/2019    History of Present Illness 64 year old female with PMH of bipolar disorder and hypertension who has xanax and muscle relaxant at home and per report was found down unresponsive in her bedroom with presumed drug overdose with xanax and lunesta empty pill bottles.  Patient was brought to the ED where she was intubated. Pt extubated on 12/16/2019.   Clinical Impression   Patient is a 64 year old female that lives with her spouse in a mobile home with 3 steps to enter. At baseline patient reports she is independent with self care and mobility. Currently patient is min guard due to mild balance deficits for functional transfers and ambulation, min A for UB ADL and mod A for LB dressing due to swelling in R UE/hand. OT educate patient on exercises, self massage and elevating R UE for edema management. Patient would benefit from continued acute OT services for ADL retraining, there ex, continued edema management/education.     Follow Up Recommendations  Outpatient OT;Supervision/Assistance - 24 hour          Precautions / Restrictions Precautions Precautions: Fall Restrictions Weight Bearing Restrictions: No      Mobility Bed Mobility Overal bed mobility: Modified Independent Bed Mobility: Supine to Sit              Transfers Overall transfer level: Needs assistance Equipment used: None Transfers: Sit to/from Stand Sit to Stand: Min guard         General transfer comment: patient able to power up without assist, min guard for safety with standing balance    Balance Overall balance assessment: Needs assistance Sitting-balance support: No upper extremity supported;Feet supported Sitting balance-Leahy Scale: Good     Standing balance support: No upper extremity supported;During functional activity Standing balance-Leahy Scale: Fair Standing  balance comment: min guard for safety                           ADL either performed or assessed with clinical judgement   ADL Overall ADL's : Needs assistance/impaired     Grooming: Set up;Sitting   Upper Body Bathing: Minimal assistance;Sitting   Lower Body Bathing: Minimal assistance;Sitting/lateral leans;Sit to/from stand   Upper Body Dressing : Minimal assistance;Sitting   Lower Body Dressing: Moderate assistance;Sitting/lateral leans Lower Body Dressing Details (indicate cue type and reason): pt able to doff socks seated EOB, unable to don. educate and provide visual demo for compensatory strategy for donning socks with limited carry over Toilet Transfer: Min Psychiatric nurse Details (indicate cue type and reason): simulated to recliner, patient mildly unsteady min guard for safety Toileting- Clothing Manipulation and Hygiene: Min guard;Sit to/from stand       Functional mobility during ADLs: Min guard;Cueing for safety General ADL Comments: due to R UE/hand deficits patient requires increased assist with ADLs and would benefit from further ADL retraining.                  Pertinent Vitals/Pain Pain Assessment: 0-10 Pain Score: 6  Faces Pain Scale: No hurt Pain Location: R UE/hand Pain Descriptors / Indicators: Tightness;Grimacing;Aching Pain Intervention(s): Monitored during session     Hand Dominance Right   Extremity/Trunk Assessment Upper Extremity Assessment Upper Extremity Assessment: RUE deficits/detail RUE Deficits / Details: limited elbow flexion, wrist extension, digit flexion and extension due to burn and edema RUE Coordination: decreased fine motor  Lower Extremity Assessment Lower Extremity Assessment: Defer to PT evaluation       Communication Communication Communication: No difficulties   Cognition Arousal/Alertness: Awake/alert Behavior During Therapy: WFL for tasks assessed/performed Overall Cognitive Status: Within  Functional Limits for tasks assessed                                 General Comments: A/O x3, still having difficulty recalling what happened. follows all directions appropriately   General Comments  educate patient on R UE/hand exercise routine, massage with L hand and elevation of limb for edema management    Exercises Exercises: Hand exercises;General Upper Extremity General Exercises - Upper Extremity Shoulder Flexion: AROM;Right;5 reps Elbow Flexion: AROM;5 reps;Right Hand Exercises Wrist Flexion: AROM;10 reps;Right Wrist Extension: AAROM;10 reps;Right Digit Composite Flexion: AAROM;10 reps;Right Composite Extension: AAROM;10 reps;Right   Shoulder Instructions      Home Living Family/patient expects to be discharged to:: Private residence Living Arrangements: Spouse/significant other Available Help at Discharge: Family;Available 24 hours/day Type of Home: Mobile home Home Access: Stairs to enter Entrance Stairs-Number of Steps: 3   Home Layout: One level     Bathroom Shower/Tub: Teacher, early years/pre: Standard     Home Equipment: None          Prior Functioning/Environment Level of Independence: Independent                 OT Problem List: Decreased range of motion;Impaired balance (sitting and/or standing);Decreased coordination;Decreased safety awareness;Obesity;Pain;Increased edema      OT Treatment/Interventions: Self-care/ADL training;Therapeutic exercise;Manual therapy;Therapeutic activities;Patient/family education;Balance training    OT Goals(Current goals can be found in the care plan section) Acute Rehab OT Goals Patient Stated Goal: to go home OT Goal Formulation: With patient Time For Goal Achievement: 01/05/20 Potential to Achieve Goals: Good  OT Frequency: Min 2X/week    AM-PAC OT "6 Clicks" Daily Activity     Outcome Measure Help from another person eating meals?: None Help from another person taking care  of personal grooming?: A Little Help from another person toileting, which includes using toliet, bedpan, or urinal?: A Little Help from another person bathing (including washing, rinsing, drying)?: A Little Help from another person to put on and taking off regular upper body clothing?: A Little Help from another person to put on and taking off regular lower body clothing?: A Lot 6 Click Score: 18   End of Session Nurse Communication: Mobility status  Activity Tolerance: Patient tolerated treatment well Patient left: in chair;with nursing/sitter in room;with call bell/phone within reach  OT Visit Diagnosis: Unsteadiness on feet (R26.81);Pain Pain - Right/Left: Right Pain - part of body: Hand;Arm                Time: [id number] OT Time Calculation (min): 24 min Charges:  OT General Charges $OT Visit: 1 Visit OT Evaluation $OT Eval Moderate Complexity: 1 Mod OT Treatments $Self Care/Home Management : 8-22 mins  Shon Millet OT OT office: [phone]  Rosemary Holms 12/22/2019, 11:38 AM
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
## 2019-12-22 NOTE — Progress Notes (Signed)
  Speech Language Pathology Treatment: Dysphagia  Patient Details Name: Debbie Steele MRN: [id number] DOB: 1956-08-06 Today's Date: 12/22/2019 Time: [id number] SLP Time Calculation (min) (ACUTE ONLY): 12 min  Assessment / Plan / Recommendation Clinical Impression  Pt was seen for skilled ST targeting diet tolerance and diagnostic treatment.  Pt was encountered awake/alert sitting upright in a chair and she was agreeable to tx session.  Mentation appeared improved from previous ST sessions per chart review.  Pt consumed trials of thin liquid, puree, and soft solids.  She exhibited mildly prolonged mastication and bolus formation of regular solids with trace oral residue observed.  Pt was sensate to residue and she cleared it with an independent liquid wash.  AP transport of thin liquid and puree was timely.  No clinical s/sx of aspiration were observed with any trials despite challenging.  Pt was re-educated regarding compensatory strategies and she verbalized understanding.  Recommend diet upgrade to Dysphagia 3 (mech soft) solids and continuation of thin liquids with full supervision to assist with self-feeding and to cue for compensatory strategies.  SLP will continue to f/u per POC.     HPI HPI: 64 year old female who was critically ill due to acute respiratory failure requiring mechanical ventilation following a Xanax and muscle relaxant overdose. Intubated 1/8, she was extubated 1/9. She continues to be somnolent but arousable.       SLP Plan  Continue with current plan of care       Recommendations  Diet recommendations: Dysphagia 3 (mechanical soft);Thin liquid Liquids provided via: Cup;Straw Medication Administration: Whole meds with puree Supervision: Staff to assist with self feeding;Full supervision/cueing for compensatory strategies Compensations: Minimize environmental distractions;Slow rate;Small sips/bites;Lingual sweep for clearance of pocketing Postural Changes and/or  Swallow Maneuvers: Seated upright 90 degrees;Upright 30-60 min after meal                Oral Care Recommendations: Oral care BID Follow up Recommendations: 24 hour supervision/assistance SLP Visit Diagnosis: Dysphagia, unspecified (R13.10) Plan: Continue with current plan of care                     Colin Mulders M.S., [place] [facility] Office: [phone]  [place] 12/22/2019, 8:34 AM

## 2019-12-22 NOTE — Progress Notes (Signed)
Physical Therapy Re-Evaluation  Patient Details Name: Debbie Steele MRN: [id number] DOB: January 05, 1956 Today's Date: 12/22/2019    History of Present Illness 64 year old female with PMH of bipolar disorder and hypertension who has xanax and muscle relaxant at home and per report was found down unresponsive in her bedroom with presumed drug overdose with xanax and lunesta empty pill bottles.  Patient was brought to the ED where she was intubated. Pt extubated on 12/16/2019.    PT Comments    Re-eval performed per medical team request. Patient received in bed, very pleasant and able to follow cues/interact appropriately with PT today although with flat affect. Able to perform bed mobility with full independence, functional transfers with S/no device, and gait approximately 124ft with no device and min guard/occasional MinA due to one time LOB when turning in her room. VSS during session on room air. Per CSW, she does live with her husband, and patient states this during session, also states her husband is there with her most of the time. She was left up in the chair with safety sitter present and supervising. Due to drastic improvement in functional mobility and cognition, updated PT recommendations to 24/7A- currently awaiting if she will be admitted to [facility], if not she will require HHPT and 24/7A.    Follow Up Recommendations  Supervision/Assistance - 24 hour;Other (comment)([facility] if admitted/appropriate, HHPT and 24/7A at home if not)     Equipment Recommendations  None recommended by PT    Recommendations for Other Services       Precautions / Restrictions Precautions Precautions: Fall Restrictions Weight Bearing Restrictions: No    Mobility  Bed Mobility Overal bed mobility: Independent Bed Mobility: Supine to Sit     Supine to sit: Modified independent (Device/Increase time)     General bed mobility comments: no physical assist given, did not need to use  railing  Transfers Overall transfer level: Needs assistance Equipment used: None Transfers: Sit to/from Stand Sit to Stand: Supervision         General transfer comment: S for safety, wide BOS but steady and stable  Ambulation/Gait Ambulation/Gait assistance: Min guard;Min assist Gait Distance (Feet): 140 Feet Assistive device: None Gait Pattern/deviations: Step-through pattern;Narrow base of support;Drifts right/left;Decreased step length - right;Decreased step length - left Gait velocity: reduced   General Gait Details: gait much improved today, tends to have narrow BOS and did need one time MinA to maintain balance when turning to walk around bed in the room, otherwise min guard for entirety of gait period   Stairs             Wheelchair Mobility    Modified Rankin (Stroke Patients Only)       Balance Overall balance assessment: Needs assistance Sitting-balance support: No upper extremity supported;Feet supported Sitting balance-Leahy Scale: Good Sitting balance - Comments: distant S for safety   Standing balance support: No upper extremity supported;During functional activity Standing balance-Leahy Scale: Fair Standing balance comment: min guard-MinA for safety and balance                            Cognition Arousal/Alertness: Awake/alert Behavior During Therapy: WFL for tasks assessed/performed;Flat affect Overall Cognitive Status: Within Functional Limits for tasks assessed                                 General Comments: A/O x3, still  having difficulty recalling what happened. follows all directions appropriately         General Comments      Pertinent Vitals/Pain Pain Assessment: 0-10 Pain Score: 6  Pain Location: R UE/hand Pain Descriptors / Indicators: Tightness;Grimacing;Aching Pain Intervention(s): Monitored during session;Limited activity within patient's tolerance;Repositioned    Home Living Family/patient  expects to be discharged to:: Private residence Living Arrangements: Spouse/significant other Available Help at Discharge: Family;Available 24 hours/day Type of Home: Mobile home Home Access: Stairs to enter   Home Layout: One level Home Equipment: None      Prior Function Level of Independence: Independent          PT Goals (current goals can now be found in the care plan section) Acute Rehab PT Goals Patient Stated Goal: to go home PT Goal Formulation: With patient Time For Goal Achievement: 01/03/20 Potential to Achieve Goals: Good Progress towards PT goals: Progressing toward goals    Frequency    Min 3X/week      PT Plan Frequency needs to be updated    Co-evaluation              AM-PAC PT "6 Clicks" Mobility   Outcome Measure  Help needed turning from your back to your side while in a flat bed without using bedrails?: None Help needed moving from lying on your back to sitting on the side of a flat bed without using bedrails?: None Help needed moving to and from a bed to a chair (including a wheelchair)?: A Little Help needed standing up from a chair using your arms (e.g., wheelchair or bedside chair)?: A Little Help needed to walk in hospital room?: A Little Help needed climbing 3-5 steps with a railing? : A Little 6 Click Score: 20    End of Session Equipment Utilized During Treatment: Gait belt Activity Tolerance: Patient tolerated treatment well Patient left: in chair;with call bell/phone within reach;with nursing/sitter in room Nurse Communication: Mobility status PT Visit Diagnosis: Unsteadiness on feet (R26.81);Muscle weakness (generalized) (M62.81)     Time: [id number] PT Time Calculation (min) (ACUTE ONLY): 14 min  Charges:     Re-evaluation charge                    Windell Norfolk, DPT, PN1   Supplemental Physical Therapist [place]    Pager [phone] Acute Rehab Office [phone]

## 2019-12-22 NOTE — Social Work (Signed)
[facility] following for admission however they need to see new therapy evaluation. Have messaged PT to follow up.   Westley Hummer, MSW, [place] Work

## 2019-12-22 NOTE — Social Work (Addendum)
3:48pm- declined by [facility].   3:37pm- Referral made to [facility]. Referral also faxed to [place] at [phone]   12:49pm- CSW spoke with Audree Camel at [facility], pt would need to be more independent before they could consider placement.   CSW to f/u with [facility] and [facility] for placement consideration.   Westley Hummer, MSW, [place] Work

## 2019-12-23 DIAGNOSIS — T50902D Poisoning by unspecified drugs, medicaments and biological substances, intentional self-harm, subsequent encounter: Secondary | ICD-10-CM

## 2019-12-23 LAB — BASIC METABOLIC PANEL
Anion gap: 9 (ref 5–15)
BUN: <5 mg/dL — ABNORMAL LOW (ref 8–23)
CO2: 29 mmol/L (ref 22–32)
Calcium: 8.6 mg/dL — ABNORMAL LOW (ref 8.9–10.3)
Chloride: 101 mmol/L (ref 98–111)
Creatinine, Ser: 0.69 mg/dL (ref 0.44–1.00)
GFR calc Af Amer: >60 mL/min (ref >60)
GFR calc non Af Amer: >60 mL/min (ref >60)
Glucose, Bld: 123 mg/dL — ABNORMAL HIGH (ref 70–99)
Potassium: 3.3 mmol/L — ABNORMAL LOW (ref 3.5–5.1)
Sodium: 139 mmol/L (ref 135–145)

## 2019-12-23 LAB — CK: Total CK: 569 U/L — ABNORMAL HIGH (ref 38–234)

## 2019-12-23 NOTE — Progress Notes (Addendum)
Pt had a 5.09 seconds pause while she was eating dinner.  She c/o lightheadedness when it happened. VS obtained. Paged Dr. Vista Lawman x3 still awaiting for page back.  Passed on to night shift.  Night shift to page admission.  Idolina Primer, RN
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
## 2019-12-24 DIAGNOSIS — I455 Other specified heart block: Secondary | ICD-10-CM

## 2019-12-24 DIAGNOSIS — I480 Paroxysmal atrial fibrillation: Secondary | ICD-10-CM

## 2019-12-24 LAB — COMPREHENSIVE METABOLIC PANEL
ALT: 36 U/L (ref 0–44)
AST: 29 U/L (ref 15–41)
Albumin: 3 g/dL — ABNORMAL LOW (ref 3.5–5.0)
Alkaline Phosphatase: 54 U/L (ref 38–126)
Anion gap: 10 (ref 5–15)
BUN: <5 mg/dL — ABNORMAL LOW (ref 8–23)
CO2: 30 mmol/L (ref 22–32)
Calcium: 9 mg/dL (ref 8.9–10.3)
Chloride: 100 mmol/L (ref 98–111)
Creatinine, Ser: 0.76 mg/dL (ref 0.44–1.00)
GFR calc Af Amer: >60 mL/min (ref >60)
GFR calc non Af Amer: >60 mL/min (ref >60)
Glucose, Bld: 124 mg/dL — ABNORMAL HIGH (ref 70–99)
Potassium: 3.2 mmol/L — ABNORMAL LOW (ref 3.5–5.1)
Sodium: 140 mmol/L (ref 135–145)
Total Bilirubin: 0.8 mg/dL (ref 0.3–1.2)
Total Protein: 6 g/dL — ABNORMAL LOW (ref 6.5–8.1)

## 2019-12-24 LAB — CK TOTAL AND CKMB (NOT AT ARMC)
CK, MB: 3.2 ng/mL (ref 0.5–5.0)
Relative Index: 0.8 (ref 0.0–2.5)
Total CK: 401 U/L — ABNORMAL HIGH (ref 38–234)

## 2019-12-24 MED ORDER — POTASSIUM CHLORIDE CRYS ER 20 MEQ PO TBCR
20.0000 meq | EXTENDED_RELEASE_TABLET | Freq: Two times a day (BID) | ORAL | Status: DC
  Administered 2019-12-24 – 2019-12-26 (×5): 20 meq via ORAL
  Filled 2019-12-24 (×5): qty 1

## 2019-12-24 NOTE — Progress Notes (Signed)
CSW attempted to follow up with [facility] about referral and they stated that they did not have a referral for patient.  CSW spoke with Ailene Ravel at [facility] and she stated that patient would most likely need gero placement. Ailene Ravel informed CSW that she would faxed out to those facilities as well as resend the [employer] referral.

## 2019-12-24 NOTE — Progress Notes (Signed)
CSW spoke with [place] at [facility] who is reviewing pt for placement at their facility.   Darletta Moll MSW, [place] Worker Disposition  [facility] Ph: [phone] Fax: [phone]

## 2019-12-24 NOTE — Progress Notes (Signed)
Pt's HR dropped to 34 and returned to 50's at 21:33. Pt was asymptomatic. On call provider paged.
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
## 2019-12-24 NOTE — Progress Notes (Signed)
CSW spoke with pt regarding inpatient psych placement once medically cleared. Pt reported she was open to inpatient treatment, but stated she did not want to go to [facility] or [facility] due to a bad previous experience. Pt reported that her outpatient psychiatrist, Dr. Valeda Malm [phone], wanted to be involved in her care and would like to be notified if she is transferred to another facility.    Darletta Moll MSW, [place] Worker Disposition  [facility] Ph: [phone] Fax: [phone]

## 2019-12-24 NOTE — Progress Notes (Signed)
Patient meets criteria for inpatient treatment per Letitia Libra, NP. No appropriate beds at [facility] currently. CSW faxed referrals to the following facilities for review:  [facility]  [facility]   [facility]   [facility]  CSW will continue to seek bed placement.   Darletta Moll MSW, [place] Worker Disposition  [facility] Ph: [phone] Fax: [phone] 12/24/2019 10:02 AM

## 2019-12-24 NOTE — Progress Notes (Signed)
CSW spoke with Wendelyn Breslow at [facility], who reported that due to pt's HR dropping to 34 the pervious night they would like to wait until pt is seen by physician to ensure she is still medically cleared.   Darletta Moll MSW, [place] Worker Disposition  [facility] Ph: [phone] Fax: [phone]

## 2019-12-24 NOTE — Progress Notes (Signed)
Held atenolol this morning based on a report from night shift of her HR dropping to 30s, then 50s.  Pt also had 5.09 seconds pause yesterday.  Idolina Primer, RN

## 2019-12-25 ENCOUNTER — Inpatient Hospital Stay (HOSPITAL_COMMUNITY): Payer: Medicare Other

## 2019-12-25 DIAGNOSIS — I10 Essential (primary) hypertension: Secondary | ICD-10-CM

## 2019-12-25 DIAGNOSIS — T300 Burn of unspecified body region, unspecified degree: Secondary | ICD-10-CM

## 2019-12-25 LAB — COMPREHENSIVE METABOLIC PANEL
ALT: 33 U/L (ref 0–44)
AST: 28 U/L (ref 15–41)
Albumin: 3.1 g/dL — ABNORMAL LOW (ref 3.5–5.0)
Alkaline Phosphatase: 53 U/L (ref 38–126)
Anion gap: 10 (ref 5–15)
BUN: 8 mg/dL (ref 8–23)
CO2: 31 mmol/L (ref 22–32)
Calcium: 8.9 mg/dL (ref 8.9–10.3)
Chloride: 98 mmol/L (ref 98–111)
Creatinine, Ser: 0.74 mg/dL (ref 0.44–1.00)
GFR calc Af Amer: >60 mL/min (ref >60)
GFR calc non Af Amer: >60 mL/min (ref >60)
Glucose, Bld: 147 mg/dL — ABNORMAL HIGH (ref 70–99)
Potassium: 3.6 mmol/L (ref 3.5–5.1)
Sodium: 139 mmol/L (ref 135–145)
Total Bilirubin: 0.5 mg/dL (ref 0.3–1.2)
Total Protein: 5.9 g/dL — ABNORMAL LOW (ref 6.5–8.1)

## 2019-12-25 LAB — CK: Total CK: 309 U/L — ABNORMAL HIGH (ref 38–234)

## 2019-12-25 LAB — MAGNESIUM: Magnesium: 2 mg/dL (ref 1.7–2.4)

## 2019-12-25 LAB — SARS CORONAVIRUS 2 (TAT 6-24 HRS): SARS Coronavirus 2: NEGATIVE

## 2019-12-25 MED ORDER — HYDRALAZINE HCL 25 MG PO TABS
25.0000 mg | ORAL_TABLET | Freq: Two times a day (BID) | ORAL | Status: DC
  Administered 2019-12-25 – 2019-12-26 (×3): 25 mg via ORAL
  Filled 2019-12-25 (×3): qty 1

## 2019-12-25 NOTE — Progress Notes (Signed)
Physical Therapy Treatment Patient Details Name: Debbie Steele MRN: [id number] DOB: Mar 06, 1956 Today's Date: 12/25/2019    History of Present Illness 64 year old female with PMH of bipolar disorder and hypertension who has xanax and muscle relaxant at home and per report was found down unresponsive in her bedroom with presumed drug overdose with xanax and lunesta empty pill bottles.  Patient was brought to the ED where she was intubated. Pt extubated on 12/16/2019.    PT Comments    Patient received sitting at EOB with sitter and husband present, pleasant and willing to participate in PT today. Able to complete all mobility during session with Mod(I) to min guard with no device, showed significant improvements in balance, gait mechanics, and gait tolerance. She was left sitting at EOB with nurse sitter present, all needs otherwise met. She has been walking with nursing staff multiple times a day and appears to be close to if not at her functional baseline at this point. PT signing off- thank you for the referral.    Follow Up Recommendations  Supervision/Assistance - 24 hour;Other (comment)([facility] if admitted/appropriate, no skilled PT needs if not)     Equipment Recommendations  None recommended by PT    Recommendations for Other Services       Precautions / Restrictions Precautions Precautions: Fall Restrictions Weight Bearing Restrictions: No    Mobility  Bed Mobility Overal bed mobility: Independent Bed Mobility: Supine to Sit     Supine to sit: Independent     General bed mobility comments: no physical assist given, did not need to use railing  Transfers Overall transfer level: Needs assistance Equipment used: None Transfers: Sit to/from Stand Sit to Stand: Supervision         General transfer comment: distant S, BOS more normalized  Ambulation/Gait Ambulation/Gait assistance: Supervision Gait Distance (Feet): 300 Feet Assistive device: None Gait  Pattern/deviations: Step-through pattern;Decreased step length - right;Decreased step length - left;Trendelenburg Gait velocity: functional   General Gait Details: gait continues to improve, able to progress distance significantly with S-min guard and no significant unsteadiness or LOB with mobility, able to make quick turns without LOB   Stairs             Wheelchair Mobility    Modified Rankin (Stroke Patients Only)       Balance Overall balance assessment: Needs assistance Sitting-balance support: No upper extremity supported;Feet supported Sitting balance-Leahy Scale: Normal Sitting balance - Comments: distant S for safety   Standing balance support: No upper extremity supported;During functional activity Standing balance-Leahy Scale: Good Standing balance comment: S-min guard for safety and balance                            Cognition Arousal/Alertness: Awake/alert Behavior During Therapy: WFL for tasks assessed/performed;Flat affect Overall Cognitive Status: Within Functional Limits for tasks assessed                                 General Comments: very alert and appropriate, interactive today; some SMT deficits, cannot remember PT from session on Friday      Exercises      General Comments        Pertinent Vitals/Pain Pain Assessment: No/denies pain Pain Score: 0-No pain Faces Pain Scale: No hurt Pain Intervention(s): Limited activity within patient's tolerance;Monitored during session    Home Living  Prior Function            PT Goals (current goals can now be found in the care plan section) Acute Rehab PT Goals Patient Stated Goal: to go home PT Goal Formulation: With patient Time For Goal Achievement: 01/03/20 Potential to Achieve Goals: Good Progress towards PT goals: Progressing toward goals    Frequency    Min 3X/week      PT Plan Discharge plan needs to be updated     Co-evaluation              AM-PAC PT "6 Clicks" Mobility   Outcome Measure  Help needed turning from your back to your side while in a flat bed without using bedrails?: None Help needed moving from lying on your back to sitting on the side of a flat bed without using bedrails?: None Help needed moving to and from a bed to a chair (including a wheelchair)?: None Help needed standing up from a chair using your arms (e.g., wheelchair or bedside chair)?: None Help needed to walk in hospital room?: None Help needed climbing 3-5 steps with a railing? : A Little 6 Click Score: 23    End of Session   Activity Tolerance: Patient tolerated treatment well Patient left: in bed;with call bell/phone within reach;with nursing/sitter in room   PT Visit Diagnosis: Unsteadiness on feet (R26.81);Muscle weakness (generalized) (M62.81)     Time: 1400-1410 PT Time Calculation (min) (ACUTE ONLY): 10 min  Charges:  $Gait Training: 8-22 mins                     Windell Norfolk, DPT, PN1   Supplemental Physical Therapist [place]    Pager [phone] Acute Rehab Office [phone]
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
## 2019-12-25 NOTE — Progress Notes (Signed)
  Speech Language Pathology Treatment: Dysphagia  Patient Details Name: Debbie Steele MRN: [id number] DOB: 1956/09/14 Today's Date: 12/25/2019 Time: 7793-9030 SLP Time Calculation (min) (ACUTE ONLY): 14 min  Assessment / Plan / Recommendation Clinical Impression  Patient seen at bedside, husband present throughout session. Pt with upper and lower dentures today. She reports she found the lower dentures yesterday.  Pt sitting upright in bed. Patient seen with PO trials: chopped soft solids (peaches) and regular solids (graham cracker). Pt also seen with straw sips of thin liquids. Patient able to feed self, with good oral acceptance, adequate bolus manipulation and mastication, adequate AP transfer. Swallow appeared timely, good oral clearance achieved. No oral residue seen with soft or regular solid trials.  No overt s/s aspiration or distress seen with thin liquids via straw, soft solids or regular solids during this session. Pt denies globus sensation. ST educated pt and wife re: aspiration precautions including sitting upright when eating and drinking and importance of oral care. Pt verbalized understanding.   Recommend patient diet upgraded to regular/thin liquids. ST to sign-off. Please re-consult ST should new needs arise or if patient demonstrates s/sx dysphagia.  D/W RN re: upgrading diet and ST signing off. RN verbalized understanding.   HPI HPI: 64 year old female who was critically ill due to acute respiratory failure requiring mechanical ventilation following a Xanax and muscle relaxant overdose. Intubated 1/8, she was extubated 1/9. She continues to be somnolent but arousable.       SLP Plan  All goals met       Recommendations  Diet recommendations: Regular;Thin liquid Liquids provided via: Straw Supervision: Patient able to self feed Compensations: Minimize environmental distractions;Slow rate;Small sips/bites;Lingual sweep for clearance of pocketing Postural Changes  and/or Swallow Maneuvers: Seated upright 90 degrees;Upright 30-60 min after meal                Oral Care Recommendations: Oral care BID SLP Visit Diagnosis: Dysphagia, unspecified (R13.10) Plan: All goals met       [place]ster, M.Ed., CCC-SLP Speech Therapy Acute Rehabilitation [phone]   Marina Goodell 12/25/2019, 2:41 PM

## 2019-12-25 NOTE — Progress Notes (Signed)
CSW received a phone call from [place] at [facility]. She is requesting EKG, chest x-ray, UDS screens, updated COVID screen, and troponin levels.   Patient's chest x-ray is from 12/16/2019. Boykin Nearing is requesting scans within the last 72 hours.   CSW faxed everything but new chest x-ray. CSW informed RN and she will have the MD put in the order.   Floor CSW will need to follow up with [place] on Tuesday to secure bed placement.   Domenic Schwab, MSW, LCSW-A Clinical Disposition Social Worker [facility]/TTS [phone]

## 2019-12-26 DIAGNOSIS — E876 Hypokalemia: Secondary | ICD-10-CM

## 2019-12-26 DIAGNOSIS — G92 Toxic encephalopathy: Secondary | ICD-10-CM

## 2019-12-26 DIAGNOSIS — T796XXD Traumatic ischemia of muscle, subsequent encounter: Secondary | ICD-10-CM

## 2019-12-26 DIAGNOSIS — G929 Unspecified toxic encephalopathy: Secondary | ICD-10-CM

## 2019-12-26 DIAGNOSIS — G20 Parkinson's disease: Secondary | ICD-10-CM

## 2019-12-26 DIAGNOSIS — R7303 Prediabetes: Secondary | ICD-10-CM

## 2019-12-26 DIAGNOSIS — M6282 Rhabdomyolysis: Secondary | ICD-10-CM

## 2019-12-26 DIAGNOSIS — F3171 Bipolar disorder, in partial remission, most recent episode hypomanic: Secondary | ICD-10-CM

## 2019-12-26 DIAGNOSIS — E785 Hyperlipidemia, unspecified: Secondary | ICD-10-CM

## 2019-12-26 DIAGNOSIS — I455 Other specified heart block: Secondary | ICD-10-CM

## 2019-12-26 MED ORDER — AMLODIPINE BESYLATE 5 MG PO TABS: 10.0000 mg | ORAL_TABLET | Freq: Every day | ORAL | 3 refills | Status: AC

## 2019-12-26 MED ORDER — HYDRALAZINE HCL 25 MG PO TABS: 50.0000 mg | ORAL_TABLET | Freq: Two times a day (BID) | ORAL | Status: AC

## 2019-12-26 MED ORDER — BISACODYL 10 MG RE SUPP: 10.0000 mg | Freq: Every day | RECTAL | 0 refills | Status: AC | PRN

## 2019-12-26 MED ORDER — CHLORHEXIDINE GLUCONATE CLOTH 2 % EX PADS: 6.0000 | MEDICATED_PAD | Freq: Every day | CUTANEOUS | Status: AC

## 2019-12-26 MED ORDER — CHLORTHALIDONE 25 MG PO TABS: 25.0000 mg | ORAL_TABLET | Freq: Every day | ORAL | Status: AC

## 2019-12-26 MED ORDER — TRAMADOL HCL 50 MG PO TABS: 50.0000 mg | ORAL_TABLET | Freq: Four times a day (QID) | ORAL | Status: DC | PRN

## 2019-12-26 MED ORDER — DOCUSATE SODIUM 50 MG/5ML PO LIQD: 100.0000 mg | Freq: Two times a day (BID) | ORAL | 0 refills | Status: AC | PRN

## 2019-12-26 MED ORDER — ACETAMINOPHEN 325 MG PO TABS: 650.0000 mg | ORAL_TABLET | Freq: Once | ORAL | 0 refills | Status: AC

## 2019-12-26 NOTE — Progress Notes (Signed)
Report called to Karrie Meres at [facility].  AVS reviewed and sent with patient.

## 2019-12-26 NOTE — TOC Progression Note (Signed)
Transition of Care [facility]) - Progression Note    Patient Details  Name: Debbie Steele MRN: [id number] Date of Birth: 12-Feb-1956  Transition of Care [facility]) CM/SW [place] Phone Number: 12/26/2019, 12:12 PM  Clinical Narrative:     CSW met with pt and spouse at bedside.  Pt asked questions about facility.  CSW answered questions concerning voluntary commitment vs involuntary. Pt is agreeable to go to facility voluntary.  TOC team will continue to follow for disposition.       Expected Discharge Plan and Services                                                 Social Determinants of Health (SDOH) Interventions    Readmission Risk Interventions No flowsheet data found.

## 2019-12-26 NOTE — Progress Notes (Signed)
Occupational Therapy Treatment Patient Details Name: Debbie Steele MRN: [id number] DOB: 26-May-1956 Today's Date: 12/26/2019    History of present illness 64 year old female with PMH of bipolar disorder and hypertension who has xanax and muscle relaxant at home and per report was found down unresponsive in her bedroom with presumed drug overdose with xanax and lunesta empty pill bottles.  Patient was brought to the ED where she was intubated. Pt extubated on 12/16/2019.   OT comments  Patient agreeable to OT. Patient's R hand showing improvement in edema, ROM. Patient participate in exercises listed below, with education to continue elevation and perform fist pumps to best of ability with arm elevated in addition to self message and ROM exercises listed. With improved digit flexion patient able to don socks with set up assist, provide additional education regarding compensatory strategies for self care tasks, patient verbalize understanding. Will continue with acute OT services as patient progresses towards goals.    Follow Up Recommendations  Outpatient OT;Supervision/Assistance - 24 hour    Equipment Recommendations  None recommended by OT       Precautions / Restrictions Precautions Precautions: Fall Restrictions Weight Bearing Restrictions: No       Mobility Bed Mobility Overal bed mobility: Independent Bed Mobility: Supine to Sit     Supine to sit: Independent            Balance Overall balance assessment: Independent Sitting-balance support: No upper extremity supported;Feet supported Sitting balance-Leahy Scale: Normal                                     ADL either performed or assessed with clinical judgement   ADL Overall ADL's : Needs assistance/impaired                     Lower Body Dressing: Set up;Sitting/lateral leans Lower Body Dressing Details (indicate cue type and reason): with increased digit flexion in R hand from previous  session patient able to utilize B hands to don socks, still having to heavily rely on L UE fpr majority of task.                General ADL Comments: educate patient on compensatory strategies for LB dressing donning underwear, pants and UB dressing donning sports bra. pt verbalize understanding.                Cognition Arousal/Alertness: Awake/alert Behavior During Therapy: WFL for tasks assessed/performed;Flat affect Overall Cognitive Status: Within Functional Limits for tasks assessed                                          Exercises Exercises: Hand exercises;General Upper Extremity Hand Exercises Wrist Flexion: AROM;10 reps;Right Wrist Extension: AROM;10 reps;Right Digit Composite Flexion: AAROM;10 reps;Right Composite Extension: AAROM;10 reps;Right Thumb Abduction: AAROM;Right;10 reps Thumb Adduction: AAROM;Right;10 reps Opposition: AAROM;Right;5 reps      General Comments note edema in R had has decreased, educate patient to continue elevate R UE and performing first pumps/hand exercises throughout the day.     Pertinent Vitals/ Pain       Pain Assessment: Faces Faces Pain Scale: Hurts a little bit Pain Location: R hand Pain Descriptors / Indicators: Tightness;Grimacing;Aching Pain Intervention(s): Limited activity within patient's tolerance  Frequency  Min 2X/week        Progress Toward Goals  OT Goals(current goals can now be found in the care plan section)  Progress towards OT goals: Progressing toward goals  Acute Rehab OT Goals Patient Stated Goal: to go home OT Goal Formulation: With patient Time For Goal Achievement: 01/05/20 Potential to Achieve Goals: Good ADL Goals Pt Will Perform Upper Body Dressing: with modified independence;sitting Pt Will Perform Lower Body Dressing: with modified independence;sit to/from stand;sitting/lateral leans;with adaptive equipment Pt Will Transfer to Toilet: ambulating;regular  height toilet;Independently Pt Will Perform Toileting - Clothing Manipulation and hygiene: Independently;sit to/from stand;sitting/lateral leans Pt/caregiver will Perform Home Exercise Program: Right Upper extremity;Independently;With written HEP provided;Increased ROM  Plan Discharge plan remains appropriate       AM-PAC OT "6 Clicks" Daily Activity     Outcome Measure   Help from another person eating meals?: None Help from another person taking care of personal grooming?: A Little Help from another person toileting, which includes using toliet, bedpan, or urinal?: A Little Help from another person bathing (including washing, rinsing, drying)?: A Little Help from another person to put on and taking off regular upper body clothing?: A Little Help from another person to put on and taking off regular lower body clothing?: A Little 6 Click Score: 19    End of Session  OT Visit Diagnosis: Pain Pain - Right/Left: Right Pain - part of body: Hand;Arm   Activity Tolerance Patient tolerated treatment well   Patient Left in bed;with call bell/phone within reach;with nursing/sitter in room           Time: [id number] OT Time Calculation (min): 18 min  Charges: OT General Charges $OT Visit: 1 Visit OT Treatments $Therapeutic Exercise: 8-22 mins  [place] OT office: [place] 12/26/2019, 11:45 AM
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
## 2019-12-26 NOTE — TOC Transition Note (Addendum)
Transition of Care [facility]) - CM/SW Discharge Note   Patient Details  Name: CYDNIE WARMUTH MRN: [id number] Date of Birth: 15-Jan-1956  Transition of Care [facility]) CM/SW Contact:  Gabrielle Dare Phone Number: 12/26/2019, 3:11 PM   Clinical Narrative:    Patient will Discharge To:[facility] Anticipated DC Date:12/26/2019 Family Notified:yes, Raechel Chute, Markaya Wilkowski [phone] Transport [place]:[id number] Transport   Per MD patient ready for DC to [facility] . RN, patient, patient's family, and facility notified of DC. Assessment, Fl2/Pasrr, and Discharge Summary sent to facility. RN given number for report [phone], Bed : [id number]). DC packet on chart. Ambulance transport requested for patient.   CSW signing off.  Reed Breech LCSWA [phone]     Final [facility] Barriers to Discharge: No Barriers Identified   Patient Goals and CMS Choice        Discharge Placement              Patient chooses bed at: ([facility]) Patient to be transferred to facility by: [facility] Name of family member notified: Fajr Zucco Patient and family notified of of transfer: 12/26/19  Discharge Plan and Services                                     Social Determinants of Health (SDOH) Interventions     Readmission Risk Interventions No flowsheet data found.

## 2019-12-26 NOTE — Care Management (Signed)
[id number] 12-26-19 Case Manager received call from Admissions Coordinator from [facility] faxed to [phone]. Admissions Coordinator to contact Clinical Social Worker for additional needs. Bethena Roys, RN,BSN Case Manager [phone]

## 2019-12-27 DIAGNOSIS — I10 Essential (primary) hypertension: Secondary | ICD-10-CM | POA: Diagnosis not present

## 2019-12-27 DIAGNOSIS — M24541 Contracture, right hand: Secondary | ICD-10-CM | POA: Diagnosis not present

## 2019-12-27 DIAGNOSIS — T22211D Burn of second degree of right forearm, subsequent encounter: Secondary | ICD-10-CM | POA: Diagnosis not present

## 2019-12-27 DIAGNOSIS — R7303 Prediabetes: Secondary | ICD-10-CM | POA: Diagnosis not present

## 2019-12-27 DIAGNOSIS — R200 Anesthesia of skin: Secondary | ICD-10-CM | POA: Diagnosis not present

## 2019-12-27 DIAGNOSIS — I351 Nonrheumatic aortic (valve) insufficiency: Secondary | ICD-10-CM | POA: Diagnosis not present

## 2019-12-27 DIAGNOSIS — R600 Localized edema: Secondary | ICD-10-CM | POA: Diagnosis not present

## 2019-12-27 DIAGNOSIS — R9431 Abnormal electrocardiogram [ECG] [EKG]: Secondary | ICD-10-CM | POA: Diagnosis not present

## 2019-12-28 DIAGNOSIS — R002 Palpitations: Secondary | ICD-10-CM | POA: Diagnosis not present

## 2019-12-28 DIAGNOSIS — L03113 Cellulitis of right upper limb: Secondary | ICD-10-CM | POA: Diagnosis not present

## 2019-12-28 DIAGNOSIS — I351 Nonrheumatic aortic (valve) insufficiency: Secondary | ICD-10-CM | POA: Diagnosis not present

## 2019-12-28 DIAGNOSIS — M47812 Spondylosis without myelopathy or radiculopathy, cervical region: Secondary | ICD-10-CM | POA: Diagnosis not present

## 2019-12-28 DIAGNOSIS — M2578 Osteophyte, vertebrae: Secondary | ICD-10-CM | POA: Diagnosis not present

## 2019-12-28 DIAGNOSIS — G5631 Lesion of radial nerve, right upper limb: Secondary | ICD-10-CM | POA: Diagnosis not present

## 2019-12-28 DIAGNOSIS — M24541 Contracture, right hand: Secondary | ICD-10-CM | POA: Diagnosis not present

## 2019-12-28 DIAGNOSIS — R200 Anesthesia of skin: Secondary | ICD-10-CM | POA: Diagnosis not present

## 2019-12-28 DIAGNOSIS — R9431 Abnormal electrocardiogram [ECG] [EKG]: Secondary | ICD-10-CM | POA: Diagnosis not present

## 2019-12-28 DIAGNOSIS — I10 Essential (primary) hypertension: Secondary | ICD-10-CM | POA: Diagnosis not present

## 2019-12-28 DIAGNOSIS — M5030 Other cervical disc degeneration, unspecified cervical region: Secondary | ICD-10-CM | POA: Diagnosis not present

## 2019-12-28 DIAGNOSIS — R7303 Prediabetes: Secondary | ICD-10-CM | POA: Diagnosis not present

## 2019-12-28 DIAGNOSIS — I519 Heart disease, unspecified: Secondary | ICD-10-CM | POA: Diagnosis not present

## 2019-12-28 DIAGNOSIS — M5021 Other cervical disc displacement,  high cervical region: Secondary | ICD-10-CM | POA: Diagnosis not present

## 2019-12-28 DIAGNOSIS — I517 Cardiomegaly: Secondary | ICD-10-CM | POA: Diagnosis not present

## 2019-12-29 DIAGNOSIS — R7303 Prediabetes: Secondary | ICD-10-CM | POA: Diagnosis not present

## 2019-12-29 DIAGNOSIS — I10 Essential (primary) hypertension: Secondary | ICD-10-CM | POA: Diagnosis not present

## 2019-12-29 DIAGNOSIS — M24541 Contracture, right hand: Secondary | ICD-10-CM | POA: Diagnosis not present

## 2019-12-29 DIAGNOSIS — L03113 Cellulitis of right upper limb: Secondary | ICD-10-CM | POA: Diagnosis not present

## 2019-12-29 LAB — ALDOSTERONE + RENIN ACTIVITY W/ RATIO
ALDO / PRA Ratio: UNDETERMINED
Aldosterone: <1 ng/dL (ref 0.0–30.0)
PRA LC/MS/MS: <0.167 ng/mL/hr — ABNORMAL LOW (ref 0.167–5.380)

## 2020-01-01 DIAGNOSIS — M6249 Contracture of muscle, multiple sites: Secondary | ICD-10-CM | POA: Diagnosis not present

## 2020-01-01 DIAGNOSIS — M25531 Pain in right wrist: Secondary | ICD-10-CM | POA: Diagnosis not present

## 2020-01-01 DIAGNOSIS — M25631 Stiffness of right wrist, not elsewhere classified: Secondary | ICD-10-CM | POA: Diagnosis not present

## 2020-01-01 DIAGNOSIS — R202 Paresthesia of skin: Secondary | ICD-10-CM | POA: Diagnosis not present

## 2020-01-01 DIAGNOSIS — M25541 Pain in joints of right hand: Secondary | ICD-10-CM | POA: Diagnosis not present

## 2020-01-03 DIAGNOSIS — R202 Paresthesia of skin: Secondary | ICD-10-CM | POA: Diagnosis not present

## 2020-01-03 DIAGNOSIS — M25541 Pain in joints of right hand: Secondary | ICD-10-CM | POA: Diagnosis not present

## 2020-01-03 DIAGNOSIS — M25531 Pain in right wrist: Secondary | ICD-10-CM | POA: Diagnosis not present

## 2020-01-03 DIAGNOSIS — M6249 Contracture of muscle, multiple sites: Secondary | ICD-10-CM | POA: Diagnosis not present

## 2020-01-03 DIAGNOSIS — M25631 Stiffness of right wrist, not elsewhere classified: Secondary | ICD-10-CM | POA: Diagnosis not present

## 2020-01-05 DIAGNOSIS — G5631 Lesion of radial nerve, right upper limb: Secondary | ICD-10-CM | POA: Diagnosis not present

## 2020-01-05 DIAGNOSIS — I10 Essential (primary) hypertension: Secondary | ICD-10-CM | POA: Diagnosis not present

## 2020-01-08 DIAGNOSIS — M6249 Contracture of muscle, multiple sites: Secondary | ICD-10-CM | POA: Diagnosis not present

## 2020-01-08 DIAGNOSIS — M25541 Pain in joints of right hand: Secondary | ICD-10-CM | POA: Diagnosis not present

## 2020-01-08 DIAGNOSIS — M25531 Pain in right wrist: Secondary | ICD-10-CM | POA: Diagnosis not present

## 2020-01-08 DIAGNOSIS — M25631 Stiffness of right wrist, not elsewhere classified: Secondary | ICD-10-CM | POA: Diagnosis not present

## 2020-01-08 DIAGNOSIS — R202 Paresthesia of skin: Secondary | ICD-10-CM | POA: Diagnosis not present

## 2020-01-10 DIAGNOSIS — Z299 Encounter for prophylactic measures, unspecified: Secondary | ICD-10-CM | POA: Diagnosis not present

## 2020-01-10 DIAGNOSIS — M9212 Juvenile osteochondrosis of radius and ulna, left arm: Secondary | ICD-10-CM | POA: Diagnosis not present

## 2020-01-10 DIAGNOSIS — E1165 Type 2 diabetes mellitus with hyperglycemia: Secondary | ICD-10-CM | POA: Diagnosis not present

## 2020-01-10 DIAGNOSIS — Z87891 Personal history of nicotine dependence: Secondary | ICD-10-CM | POA: Diagnosis not present

## 2020-01-10 DIAGNOSIS — I10 Essential (primary) hypertension: Secondary | ICD-10-CM | POA: Diagnosis not present

## 2020-01-11 DIAGNOSIS — M7989 Other specified soft tissue disorders: Secondary | ICD-10-CM | POA: Diagnosis not present

## 2020-01-11 DIAGNOSIS — G250 Essential tremor: Secondary | ICD-10-CM | POA: Diagnosis not present

## 2020-01-11 DIAGNOSIS — M79643 Pain in unspecified hand: Secondary | ICD-10-CM | POA: Diagnosis not present

## 2020-01-11 DIAGNOSIS — G2401 Drug induced subacute dyskinesia: Secondary | ICD-10-CM | POA: Diagnosis not present

## 2020-01-11 DIAGNOSIS — M25531 Pain in right wrist: Secondary | ICD-10-CM | POA: Diagnosis not present

## 2020-01-11 DIAGNOSIS — M79641 Pain in right hand: Secondary | ICD-10-CM | POA: Diagnosis not present

## 2020-01-17 DIAGNOSIS — M9212 Juvenile osteochondrosis of radius and ulna, left arm: Secondary | ICD-10-CM | POA: Diagnosis not present

## 2020-01-17 DIAGNOSIS — Z299 Encounter for prophylactic measures, unspecified: Secondary | ICD-10-CM | POA: Diagnosis not present

## 2020-01-17 DIAGNOSIS — G20 Parkinson's disease: Secondary | ICD-10-CM | POA: Diagnosis not present

## 2020-01-17 DIAGNOSIS — I10 Essential (primary) hypertension: Secondary | ICD-10-CM | POA: Diagnosis not present

## 2020-01-17 DIAGNOSIS — E1165 Type 2 diabetes mellitus with hyperglycemia: Secondary | ICD-10-CM | POA: Diagnosis not present

## 2020-01-24 DIAGNOSIS — R200 Anesthesia of skin: Secondary | ICD-10-CM | POA: Diagnosis not present

## 2020-01-24 DIAGNOSIS — G5631 Lesion of radial nerve, right upper limb: Secondary | ICD-10-CM | POA: Diagnosis not present

## 2020-01-24 DIAGNOSIS — R202 Paresthesia of skin: Secondary | ICD-10-CM | POA: Diagnosis not present

## 2020-01-24 DIAGNOSIS — I10 Essential (primary) hypertension: Secondary | ICD-10-CM | POA: Diagnosis not present

## 2020-01-31 DIAGNOSIS — Z299 Encounter for prophylactic measures, unspecified: Secondary | ICD-10-CM | POA: Diagnosis not present

## 2020-01-31 DIAGNOSIS — I10 Essential (primary) hypertension: Secondary | ICD-10-CM | POA: Diagnosis not present

## 2020-01-31 DIAGNOSIS — M25562 Pain in left knee: Secondary | ICD-10-CM | POA: Diagnosis not present

## 2020-01-31 DIAGNOSIS — E1165 Type 2 diabetes mellitus with hyperglycemia: Secondary | ICD-10-CM | POA: Diagnosis not present

## 2020-01-31 DIAGNOSIS — G20 Parkinson's disease: Secondary | ICD-10-CM | POA: Diagnosis not present

## 2020-01-31 DIAGNOSIS — M25462 Effusion, left knee: Secondary | ICD-10-CM | POA: Diagnosis not present

## 2020-02-04 DIAGNOSIS — I10 Essential (primary) hypertension: Secondary | ICD-10-CM | POA: Diagnosis not present

## 2020-02-13 DIAGNOSIS — R200 Anesthesia of skin: Secondary | ICD-10-CM | POA: Diagnosis not present

## 2020-02-15 DIAGNOSIS — I10 Essential (primary) hypertension: Secondary | ICD-10-CM | POA: Diagnosis not present

## 2020-02-15 DIAGNOSIS — G5611 Other lesions of median nerve, right upper limb: Secondary | ICD-10-CM | POA: Diagnosis not present

## 2020-02-15 DIAGNOSIS — R200 Anesthesia of skin: Secondary | ICD-10-CM | POA: Diagnosis not present

## 2020-02-15 DIAGNOSIS — G5631 Lesion of radial nerve, right upper limb: Secondary | ICD-10-CM | POA: Diagnosis not present

## 2020-02-15 DIAGNOSIS — R2231 Localized swelling, mass and lump, right upper limb: Secondary | ICD-10-CM | POA: Diagnosis not present

## 2020-02-23 DIAGNOSIS — I10 Essential (primary) hypertension: Secondary | ICD-10-CM | POA: Diagnosis not present

## 2020-02-23 DIAGNOSIS — F17210 Nicotine dependence, cigarettes, uncomplicated: Secondary | ICD-10-CM | POA: Diagnosis not present

## 2020-02-23 DIAGNOSIS — R2231 Localized swelling, mass and lump, right upper limb: Secondary | ICD-10-CM | POA: Diagnosis not present

## 2020-02-23 DIAGNOSIS — E1165 Type 2 diabetes mellitus with hyperglycemia: Secondary | ICD-10-CM | POA: Diagnosis not present

## 2020-02-23 DIAGNOSIS — R200 Anesthesia of skin: Secondary | ICD-10-CM | POA: Diagnosis not present

## 2020-02-23 DIAGNOSIS — Z299 Encounter for prophylactic measures, unspecified: Secondary | ICD-10-CM | POA: Diagnosis not present

## 2020-02-23 DIAGNOSIS — R600 Localized edema: Secondary | ICD-10-CM | POA: Diagnosis not present

## 2020-02-23 DIAGNOSIS — R202 Paresthesia of skin: Secondary | ICD-10-CM | POA: Diagnosis not present

## 2020-02-28 DIAGNOSIS — I10 Essential (primary) hypertension: Secondary | ICD-10-CM | POA: Diagnosis not present

## 2020-02-28 DIAGNOSIS — G5611 Other lesions of median nerve, right upper limb: Secondary | ICD-10-CM | POA: Diagnosis not present

## 2020-02-28 DIAGNOSIS — G5631 Lesion of radial nerve, right upper limb: Secondary | ICD-10-CM | POA: Diagnosis not present

## 2020-03-05 DIAGNOSIS — I10 Essential (primary) hypertension: Secondary | ICD-10-CM | POA: Diagnosis not present

## 2020-03-11 DIAGNOSIS — F17210 Nicotine dependence, cigarettes, uncomplicated: Secondary | ICD-10-CM | POA: Diagnosis not present

## 2020-03-11 DIAGNOSIS — G20 Parkinson's disease: Secondary | ICD-10-CM | POA: Diagnosis not present

## 2020-03-11 DIAGNOSIS — Z299 Encounter for prophylactic measures, unspecified: Secondary | ICD-10-CM | POA: Diagnosis not present

## 2020-03-11 DIAGNOSIS — M25561 Pain in right knee: Secondary | ICD-10-CM | POA: Diagnosis not present

## 2020-03-12 DIAGNOSIS — I10 Essential (primary) hypertension: Secondary | ICD-10-CM | POA: Diagnosis not present

## 2020-03-12 DIAGNOSIS — I351 Nonrheumatic aortic (valve) insufficiency: Secondary | ICD-10-CM | POA: Diagnosis not present

## 2020-03-15 DIAGNOSIS — G5631 Lesion of radial nerve, right upper limb: Secondary | ICD-10-CM | POA: Diagnosis not present

## 2020-03-15 DIAGNOSIS — G5601 Carpal tunnel syndrome, right upper limb: Secondary | ICD-10-CM | POA: Diagnosis not present

## 2020-03-15 DIAGNOSIS — I10 Essential (primary) hypertension: Secondary | ICD-10-CM | POA: Diagnosis not present

## 2020-03-15 DIAGNOSIS — M24541 Contracture, right hand: Secondary | ICD-10-CM | POA: Diagnosis not present

## 2020-03-15 DIAGNOSIS — M60831 Other myositis, right forearm: Secondary | ICD-10-CM | POA: Diagnosis not present

## 2020-03-27 DIAGNOSIS — G20 Parkinson's disease: Secondary | ICD-10-CM | POA: Diagnosis not present

## 2020-03-27 DIAGNOSIS — Z299 Encounter for prophylactic measures, unspecified: Secondary | ICD-10-CM | POA: Diagnosis not present

## 2020-03-27 DIAGNOSIS — I10 Essential (primary) hypertension: Secondary | ICD-10-CM | POA: Diagnosis not present

## 2020-03-27 DIAGNOSIS — E1165 Type 2 diabetes mellitus with hyperglycemia: Secondary | ICD-10-CM | POA: Diagnosis not present

## 2020-03-27 DIAGNOSIS — G4700 Insomnia, unspecified: Secondary | ICD-10-CM | POA: Diagnosis not present

## 2020-04-01 DIAGNOSIS — G5601 Carpal tunnel syndrome, right upper limb: Secondary | ICD-10-CM | POA: Diagnosis not present

## 2020-04-01 DIAGNOSIS — G5631 Lesion of radial nerve, right upper limb: Secondary | ICD-10-CM | POA: Diagnosis not present

## 2020-04-01 DIAGNOSIS — Z01812 Encounter for preprocedural laboratory examination: Secondary | ICD-10-CM | POA: Diagnosis not present

## 2020-04-01 DIAGNOSIS — M65331 Trigger finger, right middle finger: Secondary | ICD-10-CM | POA: Diagnosis not present

## 2020-04-01 DIAGNOSIS — M60831 Other myositis, right forearm: Secondary | ICD-10-CM | POA: Diagnosis not present

## 2020-04-04 DIAGNOSIS — E785 Hyperlipidemia, unspecified: Secondary | ICD-10-CM | POA: Diagnosis not present

## 2020-04-04 DIAGNOSIS — M1712 Unilateral primary osteoarthritis, left knee: Secondary | ICD-10-CM | POA: Diagnosis not present

## 2020-04-04 DIAGNOSIS — M65831 Other synovitis and tenosynovitis, right forearm: Secondary | ICD-10-CM | POA: Diagnosis not present

## 2020-04-04 DIAGNOSIS — G5631 Lesion of radial nerve, right upper limb: Secondary | ICD-10-CM | POA: Diagnosis not present

## 2020-04-04 DIAGNOSIS — Z87891 Personal history of nicotine dependence: Secondary | ICD-10-CM | POA: Diagnosis not present

## 2020-04-04 DIAGNOSIS — G5611 Other lesions of median nerve, right upper limb: Secondary | ICD-10-CM | POA: Diagnosis not present

## 2020-04-04 DIAGNOSIS — G5681 Other specified mononeuropathies of right upper limb: Secondary | ICD-10-CM | POA: Diagnosis not present

## 2020-04-04 DIAGNOSIS — M65331 Trigger finger, right middle finger: Secondary | ICD-10-CM | POA: Diagnosis not present

## 2020-04-04 DIAGNOSIS — M65841 Other synovitis and tenosynovitis, right hand: Secondary | ICD-10-CM | POA: Diagnosis not present

## 2020-04-04 DIAGNOSIS — G5601 Carpal tunnel syndrome, right upper limb: Secondary | ICD-10-CM | POA: Diagnosis not present

## 2020-04-04 DIAGNOSIS — R7303 Prediabetes: Secondary | ICD-10-CM | POA: Diagnosis not present

## 2020-04-04 DIAGNOSIS — M24541 Contracture, right hand: Secondary | ICD-10-CM | POA: Diagnosis not present

## 2020-04-04 DIAGNOSIS — I10 Essential (primary) hypertension: Secondary | ICD-10-CM | POA: Diagnosis not present

## 2020-04-05 DIAGNOSIS — I10 Essential (primary) hypertension: Secondary | ICD-10-CM | POA: Diagnosis not present

## 2020-04-08 DIAGNOSIS — M25641 Stiffness of right hand, not elsewhere classified: Secondary | ICD-10-CM | POA: Diagnosis not present

## 2020-04-08 DIAGNOSIS — G5631 Lesion of radial nerve, right upper limb: Secondary | ICD-10-CM | POA: Diagnosis not present

## 2020-04-08 DIAGNOSIS — R202 Paresthesia of skin: Secondary | ICD-10-CM | POA: Diagnosis not present

## 2020-04-08 DIAGNOSIS — M25541 Pain in joints of right hand: Secondary | ICD-10-CM | POA: Diagnosis not present

## 2020-04-08 DIAGNOSIS — M25531 Pain in right wrist: Secondary | ICD-10-CM | POA: Diagnosis not present

## 2020-04-12 DIAGNOSIS — R202 Paresthesia of skin: Secondary | ICD-10-CM | POA: Diagnosis not present

## 2020-04-12 DIAGNOSIS — G5631 Lesion of radial nerve, right upper limb: Secondary | ICD-10-CM | POA: Diagnosis not present

## 2020-04-12 DIAGNOSIS — M25641 Stiffness of right hand, not elsewhere classified: Secondary | ICD-10-CM | POA: Diagnosis not present

## 2020-04-12 DIAGNOSIS — M25541 Pain in joints of right hand: Secondary | ICD-10-CM | POA: Diagnosis not present

## 2020-04-12 DIAGNOSIS — M25531 Pain in right wrist: Secondary | ICD-10-CM | POA: Diagnosis not present

## 2020-04-15 DIAGNOSIS — G5631 Lesion of radial nerve, right upper limb: Secondary | ICD-10-CM | POA: Diagnosis not present

## 2020-04-15 DIAGNOSIS — R202 Paresthesia of skin: Secondary | ICD-10-CM | POA: Diagnosis not present

## 2020-04-15 DIAGNOSIS — M25541 Pain in joints of right hand: Secondary | ICD-10-CM | POA: Diagnosis not present

## 2020-04-15 DIAGNOSIS — M25641 Stiffness of right hand, not elsewhere classified: Secondary | ICD-10-CM | POA: Diagnosis not present

## 2020-04-15 DIAGNOSIS — M25531 Pain in right wrist: Secondary | ICD-10-CM | POA: Diagnosis not present

## 2020-04-17 DIAGNOSIS — G5631 Lesion of radial nerve, right upper limb: Secondary | ICD-10-CM | POA: Diagnosis not present

## 2020-04-17 DIAGNOSIS — M25641 Stiffness of right hand, not elsewhere classified: Secondary | ICD-10-CM | POA: Diagnosis not present

## 2020-04-17 DIAGNOSIS — M25531 Pain in right wrist: Secondary | ICD-10-CM | POA: Diagnosis not present

## 2020-04-17 DIAGNOSIS — R202 Paresthesia of skin: Secondary | ICD-10-CM | POA: Diagnosis not present

## 2020-04-17 DIAGNOSIS — M25541 Pain in joints of right hand: Secondary | ICD-10-CM | POA: Diagnosis not present

## 2020-04-22 DIAGNOSIS — G5631 Lesion of radial nerve, right upper limb: Secondary | ICD-10-CM | POA: Diagnosis not present

## 2020-04-22 DIAGNOSIS — M25541 Pain in joints of right hand: Secondary | ICD-10-CM | POA: Diagnosis not present

## 2020-04-22 DIAGNOSIS — R202 Paresthesia of skin: Secondary | ICD-10-CM | POA: Diagnosis not present

## 2020-04-22 DIAGNOSIS — M25531 Pain in right wrist: Secondary | ICD-10-CM | POA: Diagnosis not present

## 2020-04-22 DIAGNOSIS — M25641 Stiffness of right hand, not elsewhere classified: Secondary | ICD-10-CM | POA: Diagnosis not present

## 2020-04-24 DIAGNOSIS — G5631 Lesion of radial nerve, right upper limb: Secondary | ICD-10-CM | POA: Diagnosis not present

## 2020-04-24 DIAGNOSIS — M25641 Stiffness of right hand, not elsewhere classified: Secondary | ICD-10-CM | POA: Diagnosis not present

## 2020-04-24 DIAGNOSIS — M25541 Pain in joints of right hand: Secondary | ICD-10-CM | POA: Diagnosis not present

## 2020-04-24 DIAGNOSIS — R202 Paresthesia of skin: Secondary | ICD-10-CM | POA: Diagnosis not present

## 2020-04-24 DIAGNOSIS — M25531 Pain in right wrist: Secondary | ICD-10-CM | POA: Diagnosis not present

## 2020-04-26 DIAGNOSIS — M25641 Stiffness of right hand, not elsewhere classified: Secondary | ICD-10-CM | POA: Diagnosis not present

## 2020-04-26 DIAGNOSIS — M25541 Pain in joints of right hand: Secondary | ICD-10-CM | POA: Diagnosis not present

## 2020-04-26 DIAGNOSIS — M25531 Pain in right wrist: Secondary | ICD-10-CM | POA: Diagnosis not present

## 2020-04-26 DIAGNOSIS — R202 Paresthesia of skin: Secondary | ICD-10-CM | POA: Diagnosis not present

## 2020-04-26 DIAGNOSIS — G5631 Lesion of radial nerve, right upper limb: Secondary | ICD-10-CM | POA: Diagnosis not present

## 2020-04-29 DIAGNOSIS — R202 Paresthesia of skin: Secondary | ICD-10-CM | POA: Diagnosis not present

## 2020-04-29 DIAGNOSIS — M25531 Pain in right wrist: Secondary | ICD-10-CM | POA: Diagnosis not present

## 2020-04-29 DIAGNOSIS — M25541 Pain in joints of right hand: Secondary | ICD-10-CM | POA: Diagnosis not present

## 2020-04-29 DIAGNOSIS — G5631 Lesion of radial nerve, right upper limb: Secondary | ICD-10-CM | POA: Diagnosis not present

## 2020-04-29 DIAGNOSIS — M25641 Stiffness of right hand, not elsewhere classified: Secondary | ICD-10-CM | POA: Diagnosis not present

## 2020-04-30 DIAGNOSIS — F17210 Nicotine dependence, cigarettes, uncomplicated: Secondary | ICD-10-CM | POA: Diagnosis not present

## 2020-04-30 DIAGNOSIS — M25562 Pain in left knee: Secondary | ICD-10-CM | POA: Diagnosis not present

## 2020-04-30 DIAGNOSIS — E1165 Type 2 diabetes mellitus with hyperglycemia: Secondary | ICD-10-CM | POA: Diagnosis not present

## 2020-04-30 DIAGNOSIS — Z299 Encounter for prophylactic measures, unspecified: Secondary | ICD-10-CM | POA: Diagnosis not present

## 2020-04-30 DIAGNOSIS — M25561 Pain in right knee: Secondary | ICD-10-CM | POA: Diagnosis not present

## 2020-04-30 DIAGNOSIS — M1711 Unilateral primary osteoarthritis, right knee: Secondary | ICD-10-CM | POA: Diagnosis not present

## 2020-04-30 DIAGNOSIS — M1712 Unilateral primary osteoarthritis, left knee: Secondary | ICD-10-CM | POA: Diagnosis not present

## 2020-04-30 DIAGNOSIS — G8929 Other chronic pain: Secondary | ICD-10-CM | POA: Diagnosis not present

## 2020-04-30 DIAGNOSIS — M170 Bilateral primary osteoarthritis of knee: Secondary | ICD-10-CM | POA: Diagnosis not present

## 2020-05-01 DIAGNOSIS — R202 Paresthesia of skin: Secondary | ICD-10-CM | POA: Diagnosis not present

## 2020-05-01 DIAGNOSIS — G5631 Lesion of radial nerve, right upper limb: Secondary | ICD-10-CM | POA: Diagnosis not present

## 2020-05-01 DIAGNOSIS — M25641 Stiffness of right hand, not elsewhere classified: Secondary | ICD-10-CM | POA: Diagnosis not present

## 2020-05-01 DIAGNOSIS — M25531 Pain in right wrist: Secondary | ICD-10-CM | POA: Diagnosis not present

## 2020-05-01 DIAGNOSIS — M25541 Pain in joints of right hand: Secondary | ICD-10-CM | POA: Diagnosis not present

## 2020-05-03 DIAGNOSIS — R202 Paresthesia of skin: Secondary | ICD-10-CM | POA: Diagnosis not present

## 2020-05-03 DIAGNOSIS — G5631 Lesion of radial nerve, right upper limb: Secondary | ICD-10-CM | POA: Diagnosis not present

## 2020-05-03 DIAGNOSIS — M25531 Pain in right wrist: Secondary | ICD-10-CM | POA: Diagnosis not present

## 2020-05-03 DIAGNOSIS — M25641 Stiffness of right hand, not elsewhere classified: Secondary | ICD-10-CM | POA: Diagnosis not present

## 2020-05-03 DIAGNOSIS — M25541 Pain in joints of right hand: Secondary | ICD-10-CM | POA: Diagnosis not present

## 2020-05-05 DIAGNOSIS — I10 Essential (primary) hypertension: Secondary | ICD-10-CM | POA: Diagnosis not present

## 2020-05-08 DIAGNOSIS — G5631 Lesion of radial nerve, right upper limb: Secondary | ICD-10-CM | POA: Diagnosis not present

## 2020-05-08 DIAGNOSIS — M25531 Pain in right wrist: Secondary | ICD-10-CM | POA: Diagnosis not present

## 2020-05-08 DIAGNOSIS — R202 Paresthesia of skin: Secondary | ICD-10-CM | POA: Diagnosis not present

## 2020-05-08 DIAGNOSIS — M25641 Stiffness of right hand, not elsewhere classified: Secondary | ICD-10-CM | POA: Diagnosis not present

## 2020-05-08 DIAGNOSIS — M25541 Pain in joints of right hand: Secondary | ICD-10-CM | POA: Diagnosis not present

## 2020-05-10 DIAGNOSIS — M25541 Pain in joints of right hand: Secondary | ICD-10-CM | POA: Diagnosis not present

## 2020-05-10 DIAGNOSIS — M25641 Stiffness of right hand, not elsewhere classified: Secondary | ICD-10-CM | POA: Diagnosis not present

## 2020-05-10 DIAGNOSIS — R202 Paresthesia of skin: Secondary | ICD-10-CM | POA: Diagnosis not present

## 2020-05-10 DIAGNOSIS — G5631 Lesion of radial nerve, right upper limb: Secondary | ICD-10-CM | POA: Diagnosis not present

## 2020-05-10 DIAGNOSIS — M25531 Pain in right wrist: Secondary | ICD-10-CM | POA: Diagnosis not present

## 2020-05-15 DIAGNOSIS — R202 Paresthesia of skin: Secondary | ICD-10-CM | POA: Diagnosis not present

## 2020-05-15 DIAGNOSIS — M25541 Pain in joints of right hand: Secondary | ICD-10-CM | POA: Diagnosis not present

## 2020-05-15 DIAGNOSIS — M25641 Stiffness of right hand, not elsewhere classified: Secondary | ICD-10-CM | POA: Diagnosis not present

## 2020-05-15 DIAGNOSIS — M25531 Pain in right wrist: Secondary | ICD-10-CM | POA: Diagnosis not present

## 2020-05-15 DIAGNOSIS — G5631 Lesion of radial nerve, right upper limb: Secondary | ICD-10-CM | POA: Diagnosis not present

## 2020-05-17 DIAGNOSIS — M25541 Pain in joints of right hand: Secondary | ICD-10-CM | POA: Diagnosis not present

## 2020-05-17 DIAGNOSIS — R202 Paresthesia of skin: Secondary | ICD-10-CM | POA: Diagnosis not present

## 2020-05-17 DIAGNOSIS — G5631 Lesion of radial nerve, right upper limb: Secondary | ICD-10-CM | POA: Diagnosis not present

## 2020-05-17 DIAGNOSIS — M25531 Pain in right wrist: Secondary | ICD-10-CM | POA: Diagnosis not present

## 2020-05-17 DIAGNOSIS — M25641 Stiffness of right hand, not elsewhere classified: Secondary | ICD-10-CM | POA: Diagnosis not present

## 2020-05-27 ENCOUNTER — Telehealth: Payer: Self-pay | Admitting: Family Medicine

## 2020-06-05 DIAGNOSIS — I10 Essential (primary) hypertension: Secondary | ICD-10-CM | POA: Diagnosis not present

## 2020-07-05 DIAGNOSIS — I10 Essential (primary) hypertension: Secondary | ICD-10-CM | POA: Diagnosis not present

## 2020-07-09 DIAGNOSIS — I10 Essential (primary) hypertension: Secondary | ICD-10-CM | POA: Diagnosis not present

## 2020-07-09 DIAGNOSIS — F17210 Nicotine dependence, cigarettes, uncomplicated: Secondary | ICD-10-CM | POA: Diagnosis not present

## 2020-07-09 DIAGNOSIS — Z299 Encounter for prophylactic measures, unspecified: Secondary | ICD-10-CM | POA: Diagnosis not present

## 2020-07-09 DIAGNOSIS — M25562 Pain in left knee: Secondary | ICD-10-CM | POA: Diagnosis not present

## 2020-07-09 DIAGNOSIS — M1990 Unspecified osteoarthritis, unspecified site: Secondary | ICD-10-CM | POA: Diagnosis not present

## 2020-07-23 DIAGNOSIS — M170 Bilateral primary osteoarthritis of knee: Secondary | ICD-10-CM | POA: Diagnosis not present

## 2020-07-30 DIAGNOSIS — I10 Essential (primary) hypertension: Secondary | ICD-10-CM | POA: Diagnosis not present

## 2020-07-30 DIAGNOSIS — Z299 Encounter for prophylactic measures, unspecified: Secondary | ICD-10-CM | POA: Diagnosis not present

## 2020-07-30 DIAGNOSIS — F17210 Nicotine dependence, cigarettes, uncomplicated: Secondary | ICD-10-CM | POA: Diagnosis not present

## 2020-07-30 DIAGNOSIS — I499 Cardiac arrhythmia, unspecified: Secondary | ICD-10-CM | POA: Diagnosis not present

## 2020-07-31 DIAGNOSIS — I499 Cardiac arrhythmia, unspecified: Secondary | ICD-10-CM | POA: Diagnosis not present

## 2020-08-06 DIAGNOSIS — I10 Essential (primary) hypertension: Secondary | ICD-10-CM | POA: Diagnosis not present

## 2020-08-06 DIAGNOSIS — M170 Bilateral primary osteoarthritis of knee: Secondary | ICD-10-CM | POA: Diagnosis not present

## 2020-08-22 DIAGNOSIS — R002 Palpitations: Secondary | ICD-10-CM | POA: Diagnosis not present

## 2020-08-23 DIAGNOSIS — I499 Cardiac arrhythmia, unspecified: Secondary | ICD-10-CM | POA: Diagnosis not present

## 2020-08-29 DIAGNOSIS — I10 Essential (primary) hypertension: Secondary | ICD-10-CM | POA: Diagnosis not present

## 2020-08-29 DIAGNOSIS — F17210 Nicotine dependence, cigarettes, uncomplicated: Secondary | ICD-10-CM | POA: Diagnosis not present

## 2020-08-29 DIAGNOSIS — Z299 Encounter for prophylactic measures, unspecified: Secondary | ICD-10-CM | POA: Diagnosis not present

## 2020-08-29 DIAGNOSIS — E119 Type 2 diabetes mellitus without complications: Secondary | ICD-10-CM | POA: Diagnosis not present

## 2020-08-29 DIAGNOSIS — E1165 Type 2 diabetes mellitus with hyperglycemia: Secondary | ICD-10-CM | POA: Diagnosis not present

## 2020-08-29 DIAGNOSIS — G20 Parkinson's disease: Secondary | ICD-10-CM | POA: Diagnosis not present

## 2020-09-05 DIAGNOSIS — I10 Essential (primary) hypertension: Secondary | ICD-10-CM | POA: Diagnosis not present

## 2020-09-05 DIAGNOSIS — E1165 Type 2 diabetes mellitus with hyperglycemia: Secondary | ICD-10-CM | POA: Diagnosis not present

## 2020-09-05 DIAGNOSIS — K219 Gastro-esophageal reflux disease without esophagitis: Secondary | ICD-10-CM | POA: Diagnosis not present

## 2020-09-05 DIAGNOSIS — E7849 Other hyperlipidemia: Secondary | ICD-10-CM | POA: Diagnosis not present

## 2020-09-10 DIAGNOSIS — M79605 Pain in left leg: Secondary | ICD-10-CM | POA: Diagnosis not present

## 2020-09-10 DIAGNOSIS — E1143 Type 2 diabetes mellitus with diabetic autonomic (poly)neuropathy: Secondary | ICD-10-CM | POA: Diagnosis not present

## 2020-09-10 DIAGNOSIS — M79604 Pain in right leg: Secondary | ICD-10-CM | POA: Diagnosis not present

## 2020-09-17 DIAGNOSIS — F17210 Nicotine dependence, cigarettes, uncomplicated: Secondary | ICD-10-CM | POA: Diagnosis not present

## 2020-09-17 DIAGNOSIS — Z79899 Other long term (current) drug therapy: Secondary | ICD-10-CM | POA: Diagnosis not present

## 2020-09-17 DIAGNOSIS — Z0000 Encounter for general adult medical examination without abnormal findings: Secondary | ICD-10-CM | POA: Diagnosis not present

## 2020-09-17 DIAGNOSIS — I10 Essential (primary) hypertension: Secondary | ICD-10-CM | POA: Diagnosis not present

## 2020-09-17 DIAGNOSIS — E7800 Pure hypercholesterolemia, unspecified: Secondary | ICD-10-CM | POA: Diagnosis not present

## 2020-09-17 DIAGNOSIS — R5383 Other fatigue: Secondary | ICD-10-CM | POA: Diagnosis not present

## 2020-09-17 DIAGNOSIS — Z7189 Other specified counseling: Secondary | ICD-10-CM | POA: Diagnosis not present

## 2020-09-17 DIAGNOSIS — Z299 Encounter for prophylactic measures, unspecified: Secondary | ICD-10-CM | POA: Diagnosis not present

## 2020-10-01 DIAGNOSIS — M170 Bilateral primary osteoarthritis of knee: Secondary | ICD-10-CM | POA: Diagnosis not present

## 2020-10-05 DIAGNOSIS — I10 Essential (primary) hypertension: Secondary | ICD-10-CM | POA: Diagnosis not present

## 2020-10-07 DIAGNOSIS — I351 Nonrheumatic aortic (valve) insufficiency: Secondary | ICD-10-CM | POA: Diagnosis not present

## 2020-10-07 DIAGNOSIS — I10 Essential (primary) hypertension: Secondary | ICD-10-CM | POA: Diagnosis not present

## 2020-10-08 DIAGNOSIS — M170 Bilateral primary osteoarthritis of knee: Secondary | ICD-10-CM | POA: Diagnosis not present

## 2020-10-11 DIAGNOSIS — G4700 Insomnia, unspecified: Secondary | ICD-10-CM | POA: Diagnosis not present

## 2020-10-11 DIAGNOSIS — F17210 Nicotine dependence, cigarettes, uncomplicated: Secondary | ICD-10-CM | POA: Diagnosis not present

## 2020-10-11 DIAGNOSIS — E1165 Type 2 diabetes mellitus with hyperglycemia: Secondary | ICD-10-CM | POA: Diagnosis not present

## 2020-10-11 DIAGNOSIS — Z299 Encounter for prophylactic measures, unspecified: Secondary | ICD-10-CM | POA: Diagnosis not present

## 2020-10-11 DIAGNOSIS — R0683 Snoring: Secondary | ICD-10-CM | POA: Diagnosis not present

## 2020-10-15 DIAGNOSIS — M170 Bilateral primary osteoarthritis of knee: Secondary | ICD-10-CM | POA: Diagnosis not present

## 2020-11-04 ENCOUNTER — Other Ambulatory Visit (HOSPITAL_COMMUNITY): Payer: Self-pay | Admitting: Internal Medicine

## 2020-11-04 DIAGNOSIS — Z1231 Encounter for screening mammogram for malignant neoplasm of breast: Secondary | ICD-10-CM

## 2020-11-05 DIAGNOSIS — I10 Essential (primary) hypertension: Secondary | ICD-10-CM | POA: Diagnosis not present

## 2020-11-05 DIAGNOSIS — K219 Gastro-esophageal reflux disease without esophagitis: Secondary | ICD-10-CM | POA: Diagnosis not present

## 2020-11-05 DIAGNOSIS — E7849 Other hyperlipidemia: Secondary | ICD-10-CM | POA: Diagnosis not present

## 2020-11-05 DIAGNOSIS — E1165 Type 2 diabetes mellitus with hyperglycemia: Secondary | ICD-10-CM | POA: Diagnosis not present

## 2020-11-08 ENCOUNTER — Other Ambulatory Visit: Payer: Self-pay | Admitting: Internal Medicine

## 2020-11-08 DIAGNOSIS — Z1231 Encounter for screening mammogram for malignant neoplasm of breast: Secondary | ICD-10-CM

## 2020-11-11 ENCOUNTER — Other Ambulatory Visit: Payer: Self-pay

## 2020-11-11 ENCOUNTER — Ambulatory Visit
Admission: RE | Admit: 2020-11-11 | Discharge: 2020-11-11 | Disposition: A | Discharge status: Home / Self Care | Payer: Medicare Other | Source: Ambulatory Visit | Attending: Internal Medicine | Admitting: Internal Medicine

## 2020-11-11 DIAGNOSIS — Z1231 Encounter for screening mammogram for malignant neoplasm of breast: Secondary | ICD-10-CM

## 2020-11-19 DIAGNOSIS — E11319 Type 2 diabetes mellitus with unspecified diabetic retinopathy without macular edema: Secondary | ICD-10-CM | POA: Diagnosis not present

## 2020-11-19 DIAGNOSIS — H524 Presbyopia: Secondary | ICD-10-CM | POA: Diagnosis not present

## 2020-12-05 DIAGNOSIS — I10 Essential (primary) hypertension: Secondary | ICD-10-CM | POA: Diagnosis not present

## 2020-12-06 DIAGNOSIS — E1165 Type 2 diabetes mellitus with hyperglycemia: Secondary | ICD-10-CM | POA: Diagnosis not present

## 2020-12-06 DIAGNOSIS — I10 Essential (primary) hypertension: Secondary | ICD-10-CM | POA: Diagnosis not present

## 2020-12-06 DIAGNOSIS — E7849 Other hyperlipidemia: Secondary | ICD-10-CM | POA: Diagnosis not present

## 2020-12-09 ENCOUNTER — Ambulatory Visit (HOSPITAL_COMMUNITY): Payer: Medicare Other

## 2021-01-06 DIAGNOSIS — E7849 Other hyperlipidemia: Secondary | ICD-10-CM | POA: Diagnosis not present

## 2021-01-06 DIAGNOSIS — E1165 Type 2 diabetes mellitus with hyperglycemia: Secondary | ICD-10-CM | POA: Diagnosis not present

## 2021-01-06 DIAGNOSIS — I10 Essential (primary) hypertension: Secondary | ICD-10-CM | POA: Diagnosis not present

## 2021-01-08 DIAGNOSIS — E1165 Type 2 diabetes mellitus with hyperglycemia: Secondary | ICD-10-CM | POA: Diagnosis not present

## 2021-01-08 DIAGNOSIS — G20 Parkinson's disease: Secondary | ICD-10-CM | POA: Diagnosis not present

## 2021-01-08 DIAGNOSIS — I480 Paroxysmal atrial fibrillation: Secondary | ICD-10-CM | POA: Diagnosis not present

## 2021-01-08 DIAGNOSIS — I10 Essential (primary) hypertension: Secondary | ICD-10-CM | POA: Diagnosis not present

## 2021-01-08 DIAGNOSIS — Z299 Encounter for prophylactic measures, unspecified: Secondary | ICD-10-CM | POA: Diagnosis not present

## 2021-01-08 DIAGNOSIS — H109 Unspecified conjunctivitis: Secondary | ICD-10-CM | POA: Diagnosis not present

## 2021-01-14 DIAGNOSIS — M170 Bilateral primary osteoarthritis of knee: Secondary | ICD-10-CM | POA: Diagnosis not present

## 2021-02-03 DIAGNOSIS — I10 Essential (primary) hypertension: Secondary | ICD-10-CM | POA: Diagnosis not present

## 2021-02-03 DIAGNOSIS — E1165 Type 2 diabetes mellitus with hyperglycemia: Secondary | ICD-10-CM | POA: Diagnosis not present

## 2021-02-03 DIAGNOSIS — E7849 Other hyperlipidemia: Secondary | ICD-10-CM | POA: Diagnosis not present

## 2021-03-12 DIAGNOSIS — I10 Essential (primary) hypertension: Secondary | ICD-10-CM | POA: Diagnosis not present

## 2021-03-12 DIAGNOSIS — J40 Bronchitis, not specified as acute or chronic: Secondary | ICD-10-CM | POA: Diagnosis not present

## 2021-03-12 DIAGNOSIS — R059 Cough, unspecified: Secondary | ICD-10-CM | POA: Diagnosis not present

## 2021-03-12 DIAGNOSIS — Z299 Encounter for prophylactic measures, unspecified: Secondary | ICD-10-CM | POA: Diagnosis not present

## 2021-03-15 DIAGNOSIS — R059 Cough, unspecified: Secondary | ICD-10-CM | POA: Diagnosis not present

## 2021-03-15 DIAGNOSIS — J029 Acute pharyngitis, unspecified: Secondary | ICD-10-CM | POA: Diagnosis not present

## 2021-03-15 DIAGNOSIS — R509 Fever, unspecified: Secondary | ICD-10-CM | POA: Diagnosis not present

## 2021-04-14 DIAGNOSIS — I351 Nonrheumatic aortic (valve) insufficiency: Secondary | ICD-10-CM | POA: Diagnosis not present

## 2021-04-14 DIAGNOSIS — E785 Hyperlipidemia, unspecified: Secondary | ICD-10-CM | POA: Diagnosis not present

## 2021-04-14 DIAGNOSIS — I10 Essential (primary) hypertension: Secondary | ICD-10-CM | POA: Diagnosis not present

## 2021-05-05 DIAGNOSIS — E1165 Type 2 diabetes mellitus with hyperglycemia: Secondary | ICD-10-CM | POA: Diagnosis not present

## 2021-05-05 DIAGNOSIS — E7849 Other hyperlipidemia: Secondary | ICD-10-CM | POA: Diagnosis not present

## 2021-05-05 DIAGNOSIS — I10 Essential (primary) hypertension: Secondary | ICD-10-CM | POA: Diagnosis not present

## 2021-05-16 DIAGNOSIS — Z299 Encounter for prophylactic measures, unspecified: Secondary | ICD-10-CM | POA: Diagnosis not present

## 2021-05-16 DIAGNOSIS — I10 Essential (primary) hypertension: Secondary | ICD-10-CM | POA: Diagnosis not present

## 2021-05-16 DIAGNOSIS — E1165 Type 2 diabetes mellitus with hyperglycemia: Secondary | ICD-10-CM | POA: Diagnosis not present

## 2021-05-16 DIAGNOSIS — Z713 Dietary counseling and surveillance: Secondary | ICD-10-CM | POA: Diagnosis not present

## 2021-05-16 DIAGNOSIS — F17210 Nicotine dependence, cigarettes, uncomplicated: Secondary | ICD-10-CM | POA: Diagnosis not present

## 2021-05-20 IMAGING — CT CT HEAD W/O CM
4 series · 17 of 47 positions shown, 19 images · non-contrast
Comparison: None.

CLINICAL DATA: Unresponsive

EXAM:
CT HEAD WITHOUT CONTRAST
TECHNIQUE: Contiguous axial images were obtained from the base of the skull
through the vertex without intravenous contrast.

[Series 2: head wo · axial · 0.45mm/px · z∈[+1374,+1524]mm · 7 of 41 slices shown, 9 images]
[im 6/41  brain]
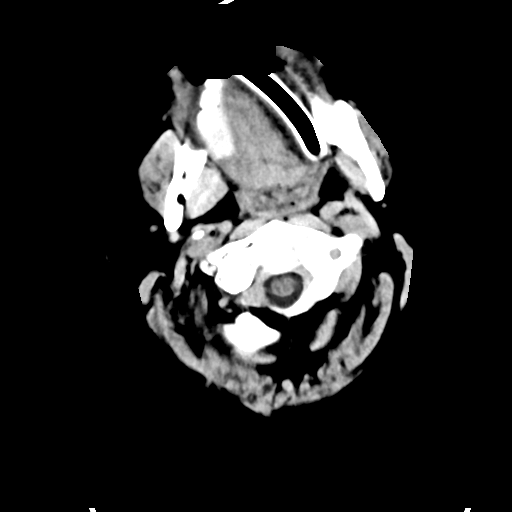
[im 6/41  bone]
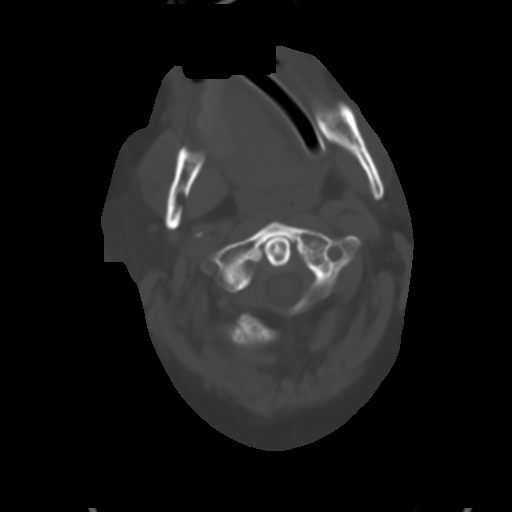
[im 11/41  brain]
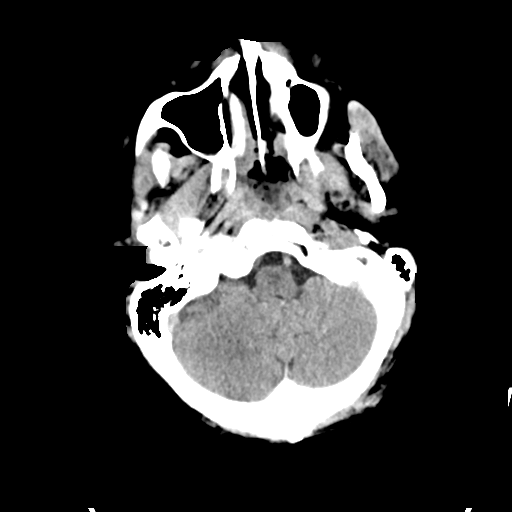
[im 16/41  brain]
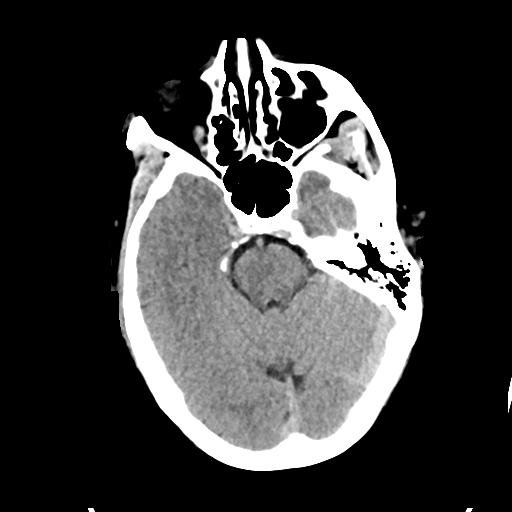
[im 21/41  brain]
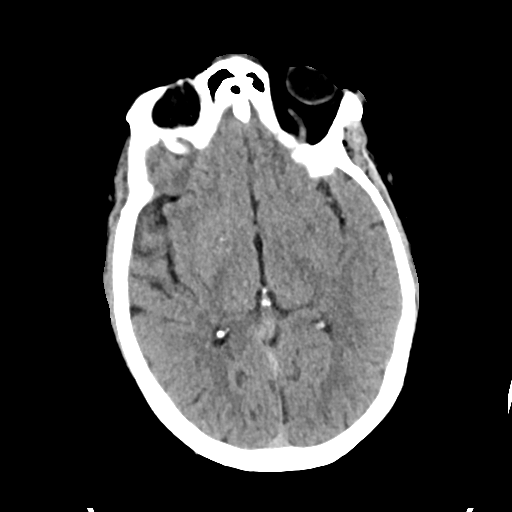
[im 26/41  brain]
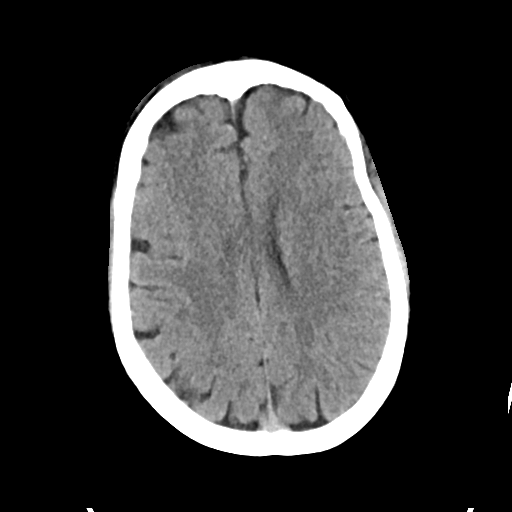
[im 26/41  bone]
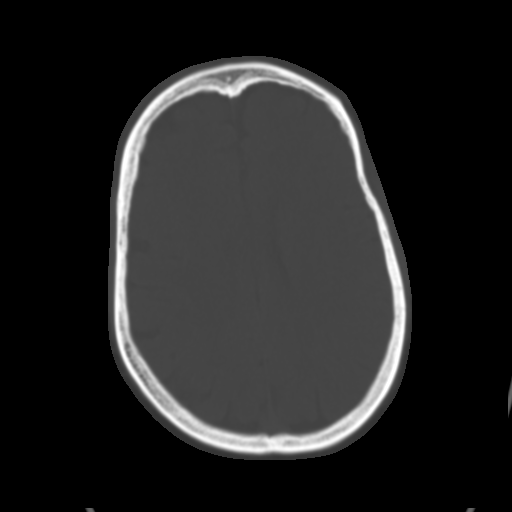
[im 31/41  brain]
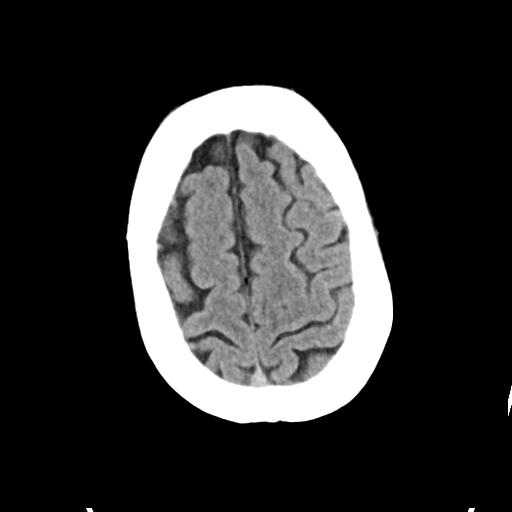
[im 36/41  brain]
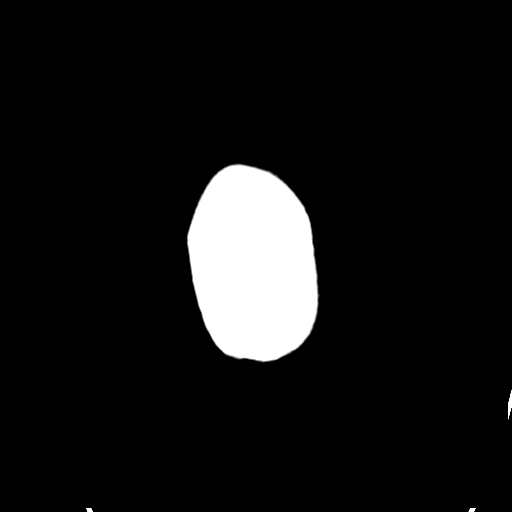

[Series 3: head bone · axial · 0.45mm/px · z∈[+1369,+1439]mm · 4 of 103 slices shown]
[im 11/103  bone]
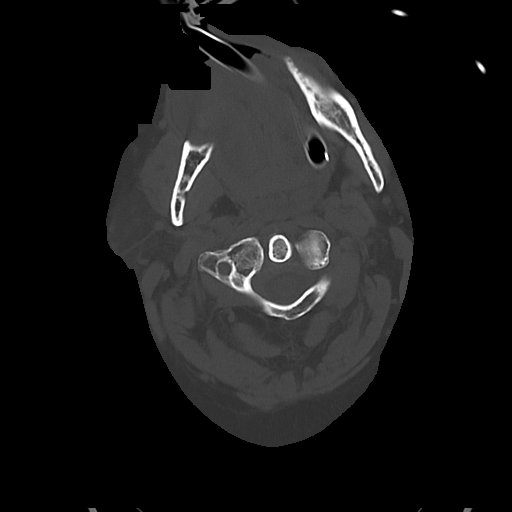
[im 21/103  bone]
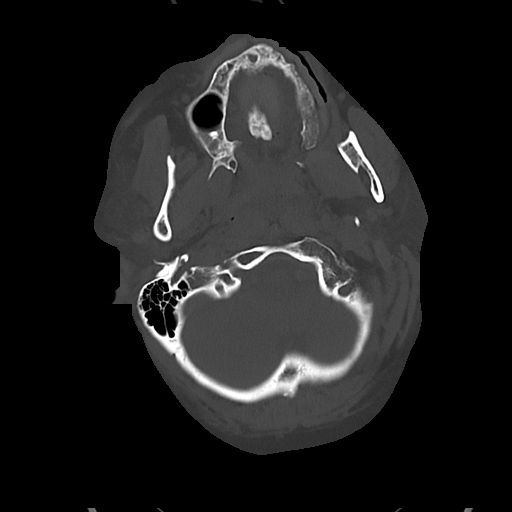
[im 31/103  bone]
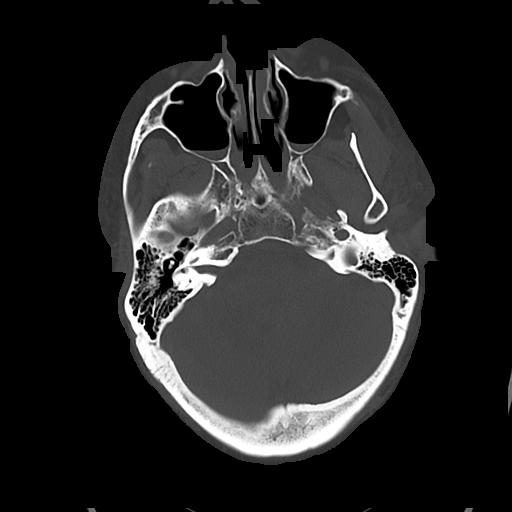
[im 46/103  bone]
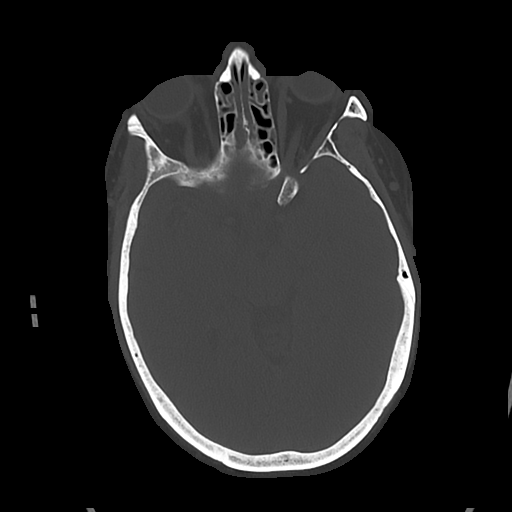

[Series 4: cor soft · coronal · 0.36mm/px · 3 of 81 slices shown]
[im 27/81  brain]
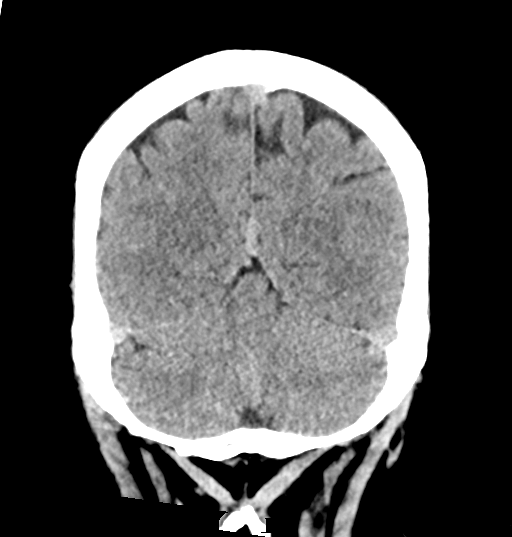
[im 36/81  brain]
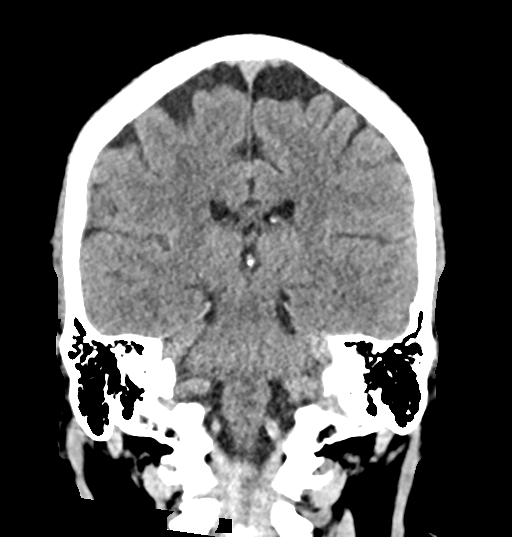
[im 45/81  brain]
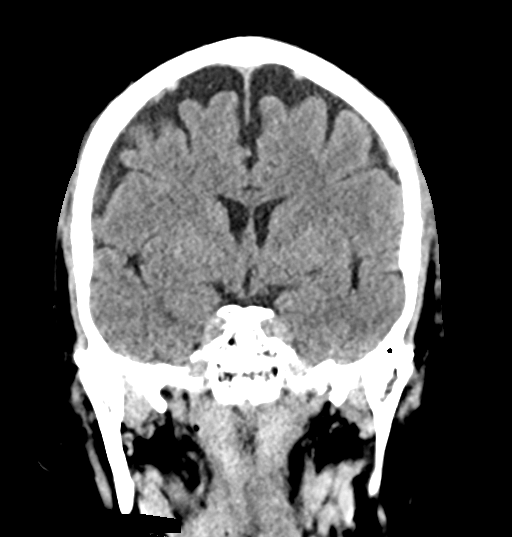

[Series 5: sag soft · sagittal · 0.39mm/px · 3 of 65 slices shown]
[im 25/65  brain]
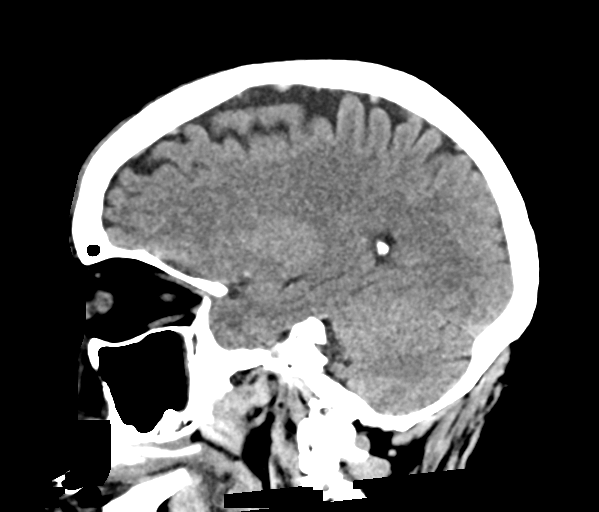
[im 33/65  brain]
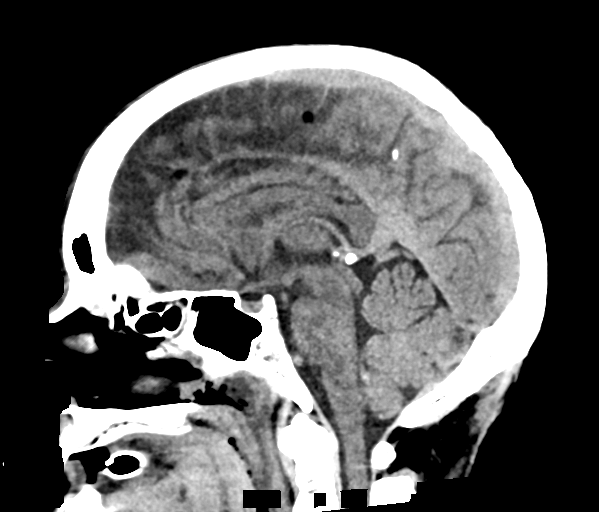
[im 40/65  brain]
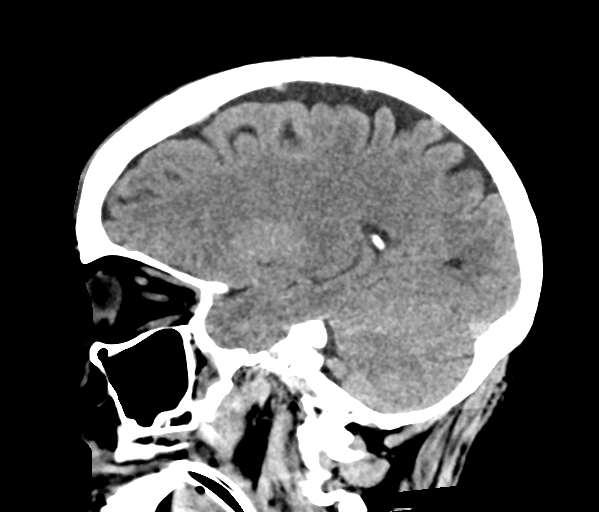

[17 of 47 positions shown; findings below may reference images not displayed]

FINDINGS: Brain: No acute territorial infarction, hemorrhage or intracranial
mass. The ventricles are of normal size.

Vascular: No hyperdense vessels.  No unexpected calcification.

Skull: Normal. Negative for fracture or focal lesion.

Sinuses/Orbits: Small fluid levels in the maxillary sinuses. Mucosal
thickening in the ethmoid sinuses

Other: None
IMPRESSION: 1. Negative non contrasted CT appearance of the brain.
2. Maxillary sinusitis

## 2021-05-20 IMAGING — DX DG CHEST 1V PORT
1 series · 1 of 1 positions shown · non-contrast
Comparison: 12/15/2019

CLINICAL DATA: Check gastric catheter placement

EXAM:
PORTABLE CHEST 1 VIEW

[chest]
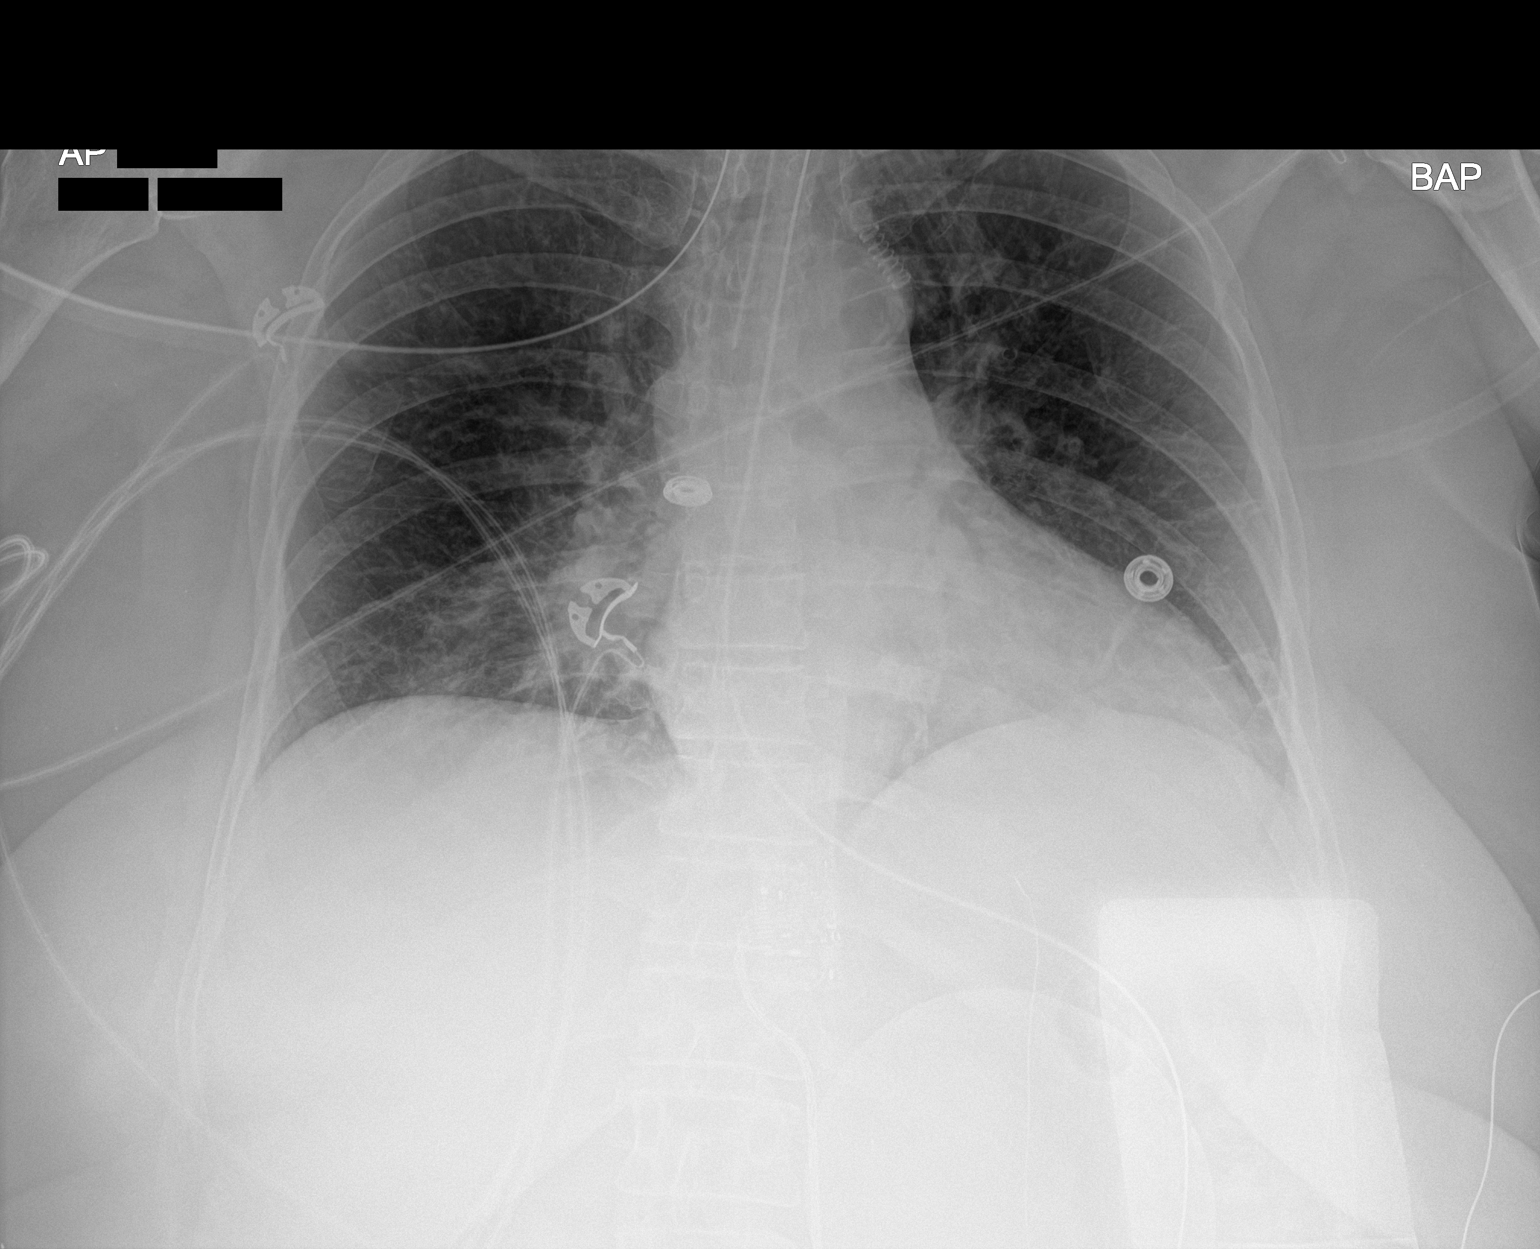

[1 of 1 positions shown; findings below may reference images not displayed]

FINDINGS: Cardiac shadows within normal limits. Lungs are well aerated
bilaterally with mild basilar atelectasis. Endotracheal tube is seen
just above the carina and could be withdrawn 1-2 cm. Gastric
catheter extends into the stomach.
IMPRESSION: Gastric catheter within the stomach.

Endotracheal tube is right above the carina and should be withdrawn
1-2 cm.

## 2021-05-20 IMAGING — DX DG CHEST 1V PORT
1 series · 1 of 1 positions shown · non-contrast
Comparison: None.

CLINICAL DATA: Unresponsive intubated

EXAM:
PORTABLE CHEST 1 VIEW

[chest ap]
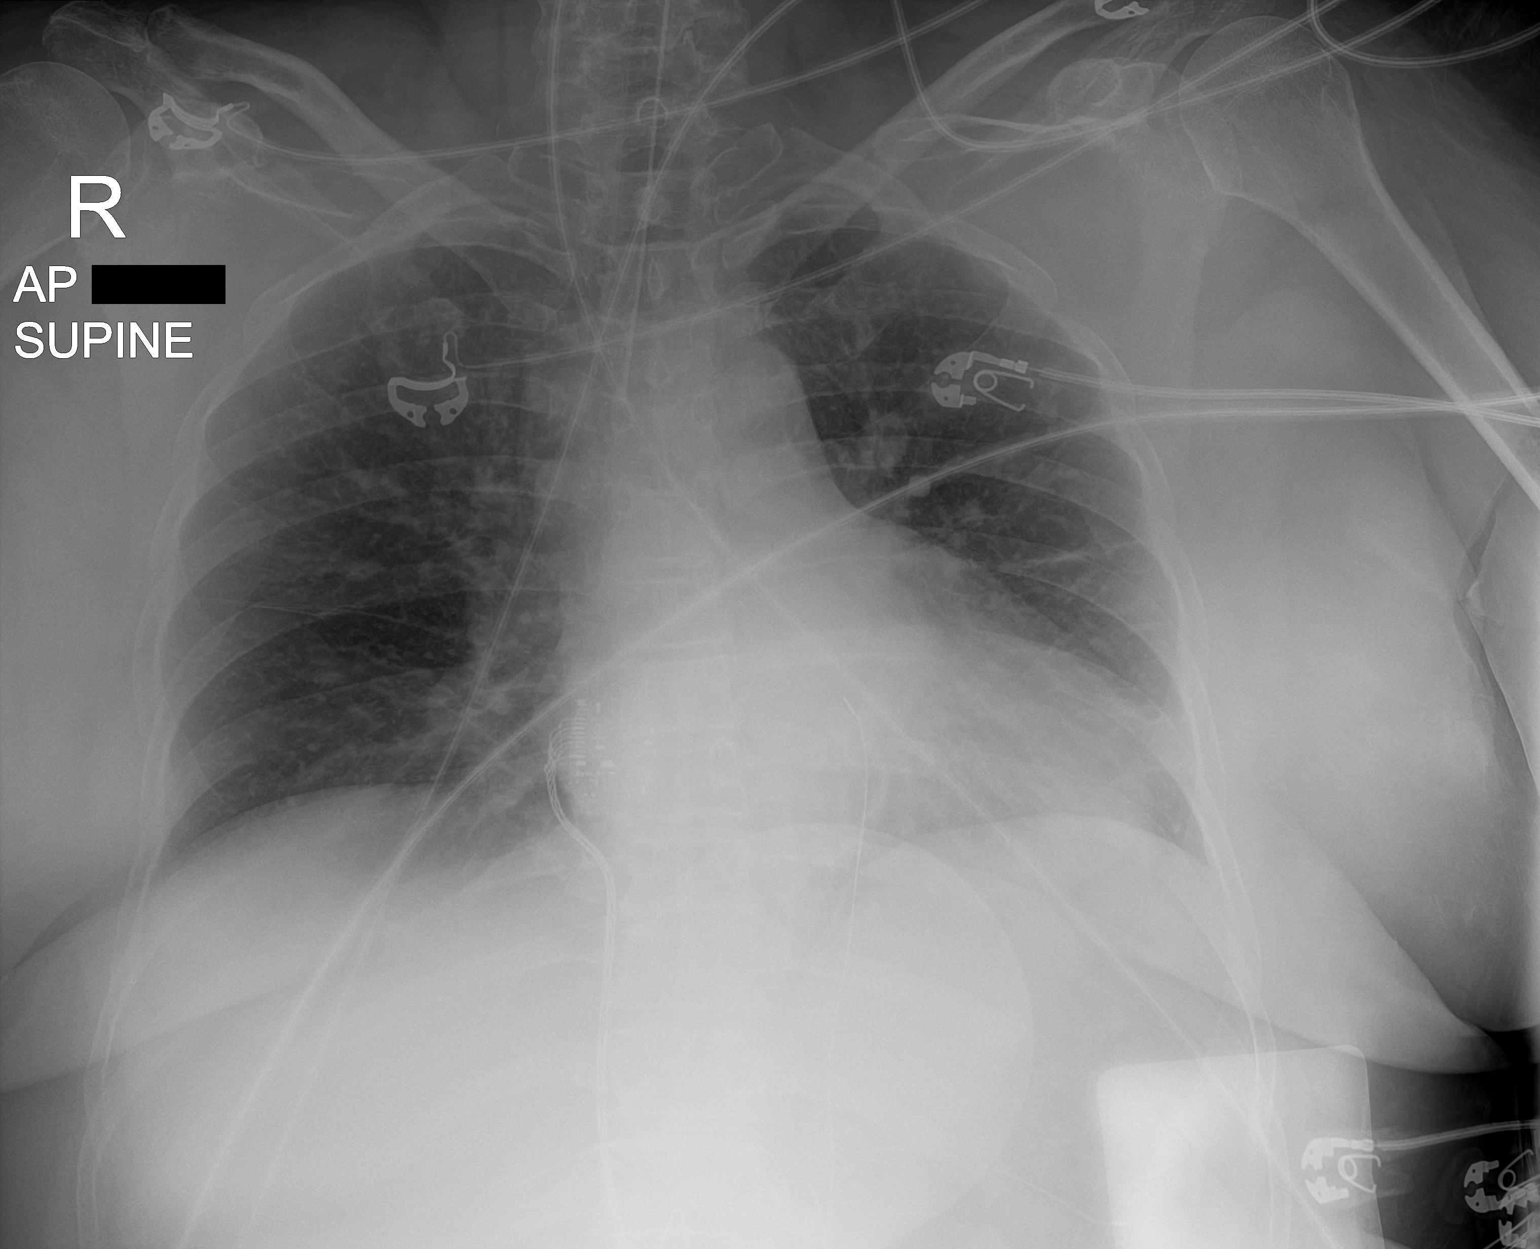

[1 of 1 positions shown; findings below may reference images not displayed]

FINDINGS: Endotracheal tube tip is less than a cm superior to the carina. Low
lung volumes. Linear atelectasis or scar in the left mid lung. No
consolidation, pleural effusion or pneumothorax. Cardiomediastinal
silhouette within normal limits for low lung volume and portable
technique.
IMPRESSION: 1. Endotracheal tube tip less than a cm superior to the carina.
2. Low lung volumes.

## 2021-05-27 DIAGNOSIS — W19XXXA Unspecified fall, initial encounter: Secondary | ICD-10-CM | POA: Diagnosis not present

## 2021-05-27 DIAGNOSIS — R2681 Unsteadiness on feet: Secondary | ICD-10-CM | POA: Diagnosis not present

## 2021-05-27 DIAGNOSIS — I10 Essential (primary) hypertension: Secondary | ICD-10-CM | POA: Diagnosis not present

## 2021-05-27 DIAGNOSIS — B370 Candidal stomatitis: Secondary | ICD-10-CM | POA: Diagnosis not present

## 2021-05-27 DIAGNOSIS — Z299 Encounter for prophylactic measures, unspecified: Secondary | ICD-10-CM | POA: Diagnosis not present

## 2021-05-30 DIAGNOSIS — R42 Dizziness and giddiness: Secondary | ICD-10-CM | POA: Diagnosis not present

## 2021-06-05 DIAGNOSIS — E7849 Other hyperlipidemia: Secondary | ICD-10-CM | POA: Diagnosis not present

## 2021-06-05 DIAGNOSIS — E1165 Type 2 diabetes mellitus with hyperglycemia: Secondary | ICD-10-CM | POA: Diagnosis not present

## 2021-06-05 DIAGNOSIS — I10 Essential (primary) hypertension: Secondary | ICD-10-CM | POA: Diagnosis not present

## 2021-06-24 DIAGNOSIS — M170 Bilateral primary osteoarthritis of knee: Secondary | ICD-10-CM | POA: Diagnosis not present

## 2021-06-27 DIAGNOSIS — I10 Essential (primary) hypertension: Secondary | ICD-10-CM | POA: Diagnosis not present

## 2021-06-27 DIAGNOSIS — E7800 Pure hypercholesterolemia, unspecified: Secondary | ICD-10-CM | POA: Diagnosis not present

## 2021-06-27 DIAGNOSIS — E1165 Type 2 diabetes mellitus with hyperglycemia: Secondary | ICD-10-CM | POA: Diagnosis not present

## 2021-06-27 DIAGNOSIS — Z299 Encounter for prophylactic measures, unspecified: Secondary | ICD-10-CM | POA: Diagnosis not present

## 2021-07-01 DIAGNOSIS — M170 Bilateral primary osteoarthritis of knee: Secondary | ICD-10-CM | POA: Diagnosis not present

## 2021-07-08 DIAGNOSIS — M170 Bilateral primary osteoarthritis of knee: Secondary | ICD-10-CM | POA: Diagnosis not present

## 2021-09-02 DIAGNOSIS — E119 Type 2 diabetes mellitus without complications: Secondary | ICD-10-CM | POA: Diagnosis not present

## 2021-09-05 DIAGNOSIS — I10 Essential (primary) hypertension: Secondary | ICD-10-CM | POA: Diagnosis not present

## 2021-09-18 DIAGNOSIS — G5601 Carpal tunnel syndrome, right upper limb: Secondary | ICD-10-CM | POA: Diagnosis not present

## 2021-09-18 DIAGNOSIS — S5421XA Injury of radial nerve at forearm level, right arm, initial encounter: Secondary | ICD-10-CM | POA: Diagnosis not present

## 2021-09-22 DIAGNOSIS — Z0000 Encounter for general adult medical examination without abnormal findings: Secondary | ICD-10-CM | POA: Diagnosis not present

## 2021-09-22 DIAGNOSIS — E7800 Pure hypercholesterolemia, unspecified: Secondary | ICD-10-CM | POA: Diagnosis not present

## 2021-09-22 DIAGNOSIS — R5383 Other fatigue: Secondary | ICD-10-CM | POA: Diagnosis not present

## 2021-09-22 DIAGNOSIS — Z299 Encounter for prophylactic measures, unspecified: Secondary | ICD-10-CM | POA: Diagnosis not present

## 2021-09-22 DIAGNOSIS — E1165 Type 2 diabetes mellitus with hyperglycemia: Secondary | ICD-10-CM | POA: Diagnosis not present

## 2021-09-22 DIAGNOSIS — Z79899 Other long term (current) drug therapy: Secondary | ICD-10-CM | POA: Diagnosis not present

## 2021-09-22 DIAGNOSIS — Z23 Encounter for immunization: Secondary | ICD-10-CM | POA: Diagnosis not present

## 2021-09-22 DIAGNOSIS — Z7189 Other specified counseling: Secondary | ICD-10-CM | POA: Diagnosis not present

## 2021-09-22 DIAGNOSIS — I10 Essential (primary) hypertension: Secondary | ICD-10-CM | POA: Diagnosis not present

## 2021-09-25 DIAGNOSIS — G5611 Other lesions of median nerve, right upper limb: Secondary | ICD-10-CM | POA: Diagnosis not present

## 2021-10-02 DIAGNOSIS — S5421XA Injury of radial nerve at forearm level, right arm, initial encounter: Secondary | ICD-10-CM | POA: Diagnosis not present

## 2021-10-02 DIAGNOSIS — G5601 Carpal tunnel syndrome, right upper limb: Secondary | ICD-10-CM | POA: Diagnosis not present

## 2021-10-06 DIAGNOSIS — I10 Essential (primary) hypertension: Secondary | ICD-10-CM | POA: Diagnosis not present

## 2021-11-05 DIAGNOSIS — I10 Essential (primary) hypertension: Secondary | ICD-10-CM | POA: Diagnosis not present

## 2021-11-11 DIAGNOSIS — I351 Nonrheumatic aortic (valve) insufficiency: Secondary | ICD-10-CM | POA: Diagnosis not present

## 2021-11-11 DIAGNOSIS — I5032 Chronic diastolic (congestive) heart failure: Secondary | ICD-10-CM | POA: Diagnosis not present

## 2021-11-11 DIAGNOSIS — I10 Essential (primary) hypertension: Secondary | ICD-10-CM | POA: Diagnosis not present

## 2021-11-11 DIAGNOSIS — R0789 Other chest pain: Secondary | ICD-10-CM | POA: Diagnosis not present

## 2021-11-11 DIAGNOSIS — R9431 Abnormal electrocardiogram [ECG] [EKG]: Secondary | ICD-10-CM | POA: Diagnosis not present

## 2021-11-21 DIAGNOSIS — I351 Nonrheumatic aortic (valve) insufficiency: Secondary | ICD-10-CM | POA: Diagnosis not present

## 2021-11-21 DIAGNOSIS — E119 Type 2 diabetes mellitus without complications: Secondary | ICD-10-CM | POA: Diagnosis not present

## 2021-11-21 DIAGNOSIS — E785 Hyperlipidemia, unspecified: Secondary | ICD-10-CM | POA: Diagnosis not present

## 2021-11-21 DIAGNOSIS — R0789 Other chest pain: Secondary | ICD-10-CM | POA: Diagnosis not present

## 2021-11-21 DIAGNOSIS — R079 Chest pain, unspecified: Secondary | ICD-10-CM | POA: Diagnosis not present

## 2021-11-21 DIAGNOSIS — I10 Essential (primary) hypertension: Secondary | ICD-10-CM | POA: Diagnosis not present

## 2021-11-21 DIAGNOSIS — Z720 Tobacco use: Secondary | ICD-10-CM | POA: Diagnosis not present

## 2021-12-05 DIAGNOSIS — I10 Essential (primary) hypertension: Secondary | ICD-10-CM | POA: Diagnosis not present

## 2022-01-05 DIAGNOSIS — G5601 Carpal tunnel syndrome, right upper limb: Secondary | ICD-10-CM | POA: Diagnosis not present

## 2022-01-05 DIAGNOSIS — S5421XA Injury of radial nerve at forearm level, right arm, initial encounter: Secondary | ICD-10-CM | POA: Diagnosis not present

## 2022-01-27 DIAGNOSIS — M170 Bilateral primary osteoarthritis of knee: Secondary | ICD-10-CM | POA: Diagnosis not present

## 2022-02-03 DIAGNOSIS — M170 Bilateral primary osteoarthritis of knee: Secondary | ICD-10-CM | POA: Diagnosis not present

## 2022-02-04 DIAGNOSIS — I10 Essential (primary) hypertension: Secondary | ICD-10-CM | POA: Diagnosis not present

## 2022-02-04 DIAGNOSIS — Z87891 Personal history of nicotine dependence: Secondary | ICD-10-CM | POA: Diagnosis not present

## 2022-02-04 DIAGNOSIS — Z299 Encounter for prophylactic measures, unspecified: Secondary | ICD-10-CM | POA: Diagnosis not present

## 2022-02-04 DIAGNOSIS — I480 Paroxysmal atrial fibrillation: Secondary | ICD-10-CM | POA: Diagnosis not present

## 2022-02-04 DIAGNOSIS — E119 Type 2 diabetes mellitus without complications: Secondary | ICD-10-CM | POA: Diagnosis not present

## 2022-02-04 DIAGNOSIS — E1165 Type 2 diabetes mellitus with hyperglycemia: Secondary | ICD-10-CM | POA: Diagnosis not present

## 2022-02-05 DIAGNOSIS — I351 Nonrheumatic aortic (valve) insufficiency: Secondary | ICD-10-CM | POA: Diagnosis not present

## 2022-02-05 DIAGNOSIS — E782 Mixed hyperlipidemia: Secondary | ICD-10-CM | POA: Diagnosis not present

## 2022-02-05 DIAGNOSIS — I10 Essential (primary) hypertension: Secondary | ICD-10-CM | POA: Diagnosis not present

## 2022-02-05 DIAGNOSIS — I5032 Chronic diastolic (congestive) heart failure: Secondary | ICD-10-CM | POA: Diagnosis not present

## 2022-02-16 DIAGNOSIS — Z299 Encounter for prophylactic measures, unspecified: Secondary | ICD-10-CM | POA: Diagnosis not present

## 2022-02-16 DIAGNOSIS — Z713 Dietary counseling and surveillance: Secondary | ICD-10-CM | POA: Diagnosis not present

## 2022-02-16 DIAGNOSIS — I10 Essential (primary) hypertension: Secondary | ICD-10-CM | POA: Diagnosis not present

## 2022-02-17 DIAGNOSIS — I10 Essential (primary) hypertension: Secondary | ICD-10-CM | POA: Diagnosis not present

## 2022-02-17 DIAGNOSIS — F17210 Nicotine dependence, cigarettes, uncomplicated: Secondary | ICD-10-CM | POA: Diagnosis not present

## 2022-02-17 DIAGNOSIS — R2681 Unsteadiness on feet: Secondary | ICD-10-CM | POA: Diagnosis not present

## 2022-02-17 DIAGNOSIS — Z299 Encounter for prophylactic measures, unspecified: Secondary | ICD-10-CM | POA: Diagnosis not present

## 2022-02-17 DIAGNOSIS — Z9181 History of falling: Secondary | ICD-10-CM | POA: Diagnosis not present

## 2022-02-24 DIAGNOSIS — Z87891 Personal history of nicotine dependence: Secondary | ICD-10-CM | POA: Diagnosis not present

## 2022-02-24 DIAGNOSIS — Z299 Encounter for prophylactic measures, unspecified: Secondary | ICD-10-CM | POA: Diagnosis not present

## 2022-02-24 DIAGNOSIS — M1712 Unilateral primary osteoarthritis, left knee: Secondary | ICD-10-CM | POA: Diagnosis not present

## 2022-02-24 DIAGNOSIS — I10 Essential (primary) hypertension: Secondary | ICD-10-CM | POA: Diagnosis not present

## 2022-02-27 DIAGNOSIS — R279 Unspecified lack of coordination: Secondary | ICD-10-CM | POA: Diagnosis not present

## 2022-02-27 DIAGNOSIS — R42 Dizziness and giddiness: Secondary | ICD-10-CM | POA: Diagnosis not present

## 2022-03-11 DIAGNOSIS — M189 Osteoarthritis of first carpometacarpal joint, unspecified: Secondary | ICD-10-CM | POA: Diagnosis not present

## 2022-03-11 DIAGNOSIS — M1811 Unilateral primary osteoarthritis of first carpometacarpal joint, right hand: Secondary | ICD-10-CM | POA: Diagnosis not present

## 2022-03-11 DIAGNOSIS — S63511A Sprain of carpal joint of right wrist, initial encounter: Secondary | ICD-10-CM | POA: Diagnosis not present

## 2022-03-11 DIAGNOSIS — S5421XA Injury of radial nerve at forearm level, right arm, initial encounter: Secondary | ICD-10-CM | POA: Diagnosis not present

## 2022-03-11 DIAGNOSIS — M25531 Pain in right wrist: Secondary | ICD-10-CM | POA: Diagnosis not present

## 2022-03-11 DIAGNOSIS — S63591A Other specified sprain of right wrist, initial encounter: Secondary | ICD-10-CM | POA: Diagnosis not present

## 2022-03-11 DIAGNOSIS — G5601 Carpal tunnel syndrome, right upper limb: Secondary | ICD-10-CM | POA: Diagnosis not present

## 2022-03-11 DIAGNOSIS — M19041 Primary osteoarthritis, right hand: Secondary | ICD-10-CM | POA: Diagnosis not present

## 2022-03-11 DIAGNOSIS — G8929 Other chronic pain: Secondary | ICD-10-CM | POA: Diagnosis not present

## 2022-03-19 DIAGNOSIS — M65331 Trigger finger, right middle finger: Secondary | ICD-10-CM | POA: Diagnosis not present

## 2022-03-19 DIAGNOSIS — M1811 Unilateral primary osteoarthritis of first carpometacarpal joint, right hand: Secondary | ICD-10-CM | POA: Diagnosis not present

## 2022-03-19 DIAGNOSIS — M24131 Other articular cartilage disorders, right wrist: Secondary | ICD-10-CM | POA: Diagnosis not present

## 2022-03-19 DIAGNOSIS — G5601 Carpal tunnel syndrome, right upper limb: Secondary | ICD-10-CM | POA: Diagnosis not present

## 2022-03-19 DIAGNOSIS — S5421XA Injury of radial nerve at forearm level, right arm, initial encounter: Secondary | ICD-10-CM | POA: Diagnosis not present

## 2022-03-23 DIAGNOSIS — M1712 Unilateral primary osteoarthritis, left knee: Secondary | ICD-10-CM | POA: Diagnosis not present

## 2022-03-23 NOTE — H&P (Signed)
KNEE ARTHROPLASTY ADMISSION H&P ? ?Patient ID: ?Debbie Steele ?MRN: [id number] ?DOB/AGE: Jan 31, 1956 66 y.o. ? ?Chief Complaint: left knee pain. ? ?Planned Procedure Date: 04-13-22 ?Medical Clearance by Dr. Manuella Ghazi   ?Cardiac Clearance by Dr. Candis Musa ? ? ?HPI: ?Debbie Steele is a 66 y.o. female who presents for evaluation of OA LEFT KNEE. The patient has a history of pain and functional disability in the left knee due to arthritis and has failed non-surgical conservative treatments for greater than 12 weeks to include NSAID's and/or analgesics, corticosteriod injections, viscosupplementation injections, use of assistive devices, and activity modification.  Onset of symptoms was gradual, starting 5 years ago with gradually worsening course since that time. The patient noted no past surgery on the left knee.  Patient currently rates pain at 9 out of 10 with activity. Patient has night pain, worsening of pain with activity and weight bearing, and pain that interferes with activities of daily living.  Patient has evidence of subchondral sclerosis, joint subluxation, and joint space narrowing by imaging studies.  There is no active infection. ? ?Past Medical History:  ?Diagnosis Date  ? Anxiety   ? Depression   ? Hyperlipidemia   ? Hypertension   ? ?Past Surgical History:  ?Procedure Laterality Date  ? CERVICAL ABLATION    ? CHOLECYSTECTOMY    ? TUBAL LIGATION    ? ?Allergies  ?Allergen Reactions  ? Nuedexta [Dextromethorphan-Quinidine] Hives  ? Depakene [Valproic Acid] Itching and Rash  ? ?Prior to Admission medications   ?Medication Sig Start Date End Date Taking? Authorizing Provider  ?ALPRAZolam (XANAX) 0.5 MG tablet Take 0.5 mg by mouth at bedtime as needed for anxiety.    [provider]  ?amLODipine (NORVASC) 5 MG tablet Take 2 tablets (10 mg total) by mouth daily. 12/26/19   Guilford Shi, MD  ?bisacodyl (DULCOLAX) 10 MG suppository Place 1 suppository (10 mg total) rectally daily as needed for moderate  constipation. 12/26/19   Guilford Shi, MD  ?Chlorhexidine Gluconate Cloth 2 % PADS Apply 6 each topically daily. 12/26/19   Guilford Shi, MD  ?chlorthalidone (HYGROTON) 25 MG tablet Take 1 tablet (25 mg total) by mouth daily. 12/27/19   Guilford Shi, MD  ?docusate (COLACE) 50 MG/5ML liquid Place 10 mLs (100 mg total) into feeding tube 2 (two) times daily as needed for mild constipation. 12/26/19   Guilford Shi, MD  ?Eszopiclone (ESZOPICLONE) 3 MG TABS Take 3 mg by mouth at bedtime. Take immediately before bedtime    [provider]  ?gabapentin (NEURONTIN) 400 MG capsule Take 400 mg by mouth 2 (two) times daily.    [provider]  ?glucose blood test strip Check BS QD and PRN 03/31/17   Dettinger, Fransisca Kaufmann, MD  ?hydrALAZINE (APRESOLINE) 25 MG tablet Take 2 tablets (50 mg total) by mouth 2 (two) times daily. 12/26/19   Guilford Shi, MD  ?lamoTRIgine (LAMICTAL) 200 MG tablet Take 150 mg by mouth daily.     [provider]  ?metFORMIN (GLUCOPHAGE) 500 MG tablet Take 500 mg by mouth daily.    [provider]  ?primidone (MYSOLINE) 50 MG tablet Take 50 mg by mouth 2 (two) times daily.    [provider]  ?sertraline (ZOLOFT) 100 MG tablet Take 100 mg by mouth daily.    [provider]  ?simvastatin (ZOCOR) 40 MG tablet TAKE 1 TABLET (40 MG TOTAL) BY MOUTH AT BEDTIME. ?Patient taking differently: Take 40 mg by mouth daily.  09/30/17   Dettinger, Vonna Kotyk  A, MD  ?traMADol (ULTRAM) 50 MG tablet Take 1 tablet (50 mg total) by mouth every 6 (six) hours as needed for moderate pain or severe pain. 12/26/19   Guilford Shi, MD  ? ?Social History  ? ?Socioeconomic History  ? Marital status: Married  ?  Spouse name: Not on file  ? Number of children: Not on file  ? Years of education: Not on file  ? Highest education level: Not on file  ?Occupational History  ? Not on file  ?Tobacco Use  ? Smoking status: Every Day  ?  Types: E-cigarettes  ? Smokeless  tobacco: Never  ? Tobacco comments:  ?  E-Cigs  ?Vaping Use  ? Vaping Use: Never used  ?Substance and Sexual Activity  ? Alcohol use: No  ? Drug use: No  ? Sexual activity: Not on file  ?Other Topics Concern  ? Not on file  ?Social History Narrative  ? Not on file  ? ?Social Determinants of Health  ? ?Financial Resource Strain: Not on file  ?Food Insecurity: Not on file  ?Transportation Needs: Not on file  ?Physical Activity: Not on file  ?Stress: Not on file  ?Social Connections: Not on file  ? ?Family History  ?Problem Relation Age of Onset  ? Diabetes Mother   ? Glaucoma Mother   ? Hypertension Father   ? Anuerysm Father   ? Hypertension Sister   ? Diabetes Brother   ? ALS Brother   ? Glaucoma Maternal Grandfather   ? Cancer Paternal Grandmother   ? Cancer Paternal Grandfather   ? ? ?ROS: Currently denies lightheadedness, dizziness, Fever, chills, CP, SOB.   ?Full dentures ?All other systems have been reviewed and were otherwise currently negative with the exception of those mentioned in the HPI and as above. ? ?Objective: ?Vitals: Ht: 5'8" Wt: 251.4 lbs Temp: 97.6 BP: 134/82 Pulse: 59 O2 94% on room air.   ?Physical Exam: ?General: Alert, NAD.  Antalgic Gait  ?HEENT: EOMI, Good Neck Extension  ?Pulm: No increased work of breathing.  Clear B/L A/P w/o crackle or wheeze.  ?CV: RRR, No m/g/r appreciated  ?GI: soft, NT, ND. BS x 4 quadrants ?Neuro: CN II-XII grossly intact without focal deficit.  Sensation intact distally ?Skin: No lesions in the area of chief complaint ?MSK/Surgical Site:  + JLT. ROM 0-110.  5/5 strength in extension and flexion.  +EHL/FHL.  NVI.  Stable varus and valgus stress.  ? ? ?Imaging Review ?Plain radiographs demonstrate severe degenerative joint disease of the left knee.  ? ?The overall alignment isneutral. The bone quality appears to be fair for age and reported activity level. ? ?Preoperative templating of the joint replacement has been completed, documented, and submitted to the  Operating Room personnel in order to optimize intra-operative equipment management. ? ?Assessment: ?OA LEFT KNEE ?Active Problems: ?  * No active hospital problems. * ? ? ?Plan: ?Plan for Procedure(s): ?TOTAL KNEE ARTHROPLASTY ? ?The patient history, physical exam, clinical judgement of the provider and imaging are consistent with end stage degenerative joint disease and total joint arthroplasty is deemed medically necessary. The treatment options including medical management, injection therapy, and arthroplasty were discussed at length. The risks and benefits of Procedure(s): ?TOTAL KNEE ARTHROPLASTY were presented and reviewed.  ?The risks of nonoperative treatment, versus surgical intervention including but not limited to continued pain, aseptic loosening, stiffness, dislocation/subluxation, infection, bleeding, nerve injury, blood clots, cardiopulmonary complications, morbidity, mortality, among others were discussed. The patient verbalizes understanding and wishes  to proceed with the plan.  ?Patient is being admitted for inpatient treatment for surgery, pain control, PT, prophylactic antibiotics, VTE prophylaxis, progressive ambulation, ADL's and discharge planning.  ? ?Dental prophylaxis discussed and recommended for 2 years postoperatively. ? ?The patient does meet the criteria for TXA which will be used perioperatively.   ?ASA 81 mg BID will be used postoperatively for DVT prophylaxis in addition to SCDs, and early ambulation. ?Plan for Tylenol, Gabapentin, oxycodone for pain.   ?NO NSAIDs allowed due to h/o ulcer ?Zofran for nausea and vomiting. ?Pharmacy- [place] ?The patient is planning to be discharged home with OPPT and into the care of her husband Clance Boll who can be reached at [phone] ?Follow up appt 04-30-22 at 3:15pm ? ? ? ? ?Britt Bottom, PA-C ?Office [phone] ?03/23/2022 ?2:52 PM ?  ?

## 2022-03-25 DIAGNOSIS — G4730 Sleep apnea, unspecified: Secondary | ICD-10-CM | POA: Diagnosis not present

## 2022-03-30 NOTE — Progress Notes (Addendum)
COVID Vaccine Completed:Yes x2 ?Date COVID Vaccine completed: ?Has received booster: ?COVID vaccine manufacturer:    Moderna    ? ?Date of COVID positive in last 90 days: No ? ?PCP - Monico Blitz, MD ?Cardiologist - Soheil Assar, DO ? ?Medical clearance on chart dated 02-04-22 by Dr. Monico Blitz, MD ? ?Chest x-ray - N/A ?EKG - 02-05-22 CEW.  On chart ?Stress Test - 11-21-21 CEW ?ECHO - greater than 2 years CEW ?Cardiac Cath - N/A ?Pacemaker/ICD dN/Aevice last checked: ?Spinal Cord Stimulator: ? ?Bowel Prep - N/A ? ?Sleep Study - Yes, 4-23, waiting on results ?CPAP - N/A ? ?Fasting Blood Sugar - 110 to 140 ?Checks Blood Sugar - checks occasionally ? ?Blood Thinner Instructions:  N/A ?Aspirin Instructions: ?Last Dose: ? ?Activity level:   Can go up a flight of stairs and perform activities of daily living without stopping and without symptoms of chest pain or shortness of breath. ?   ?Anesthesia review:  Aortic valve insufficiency, HTN, pre-DM ? ?Patient denies shortness of breath, fever, cough and chest pain at PAT appointment ? ?Patient verbalized understanding of instructions that were given to them at the PAT appointment. Patient was also instructed that they will need to review over the PAT instructions again at home before surgery.  ?

## 2022-03-30 NOTE — Patient Instructions (Addendum)
DUE TO COVID-19 ONLY TWO VISITORS  (aged 64 and older)  IS ALLOWED TO COME WITH YOU AND STAY IN THE WAITING ROOM ONLY DURING PRE OP AND PROCEDURE.   ?**NO VISITORS ARE ALLOWED IN THE SHORT STAY AREA OR RECOVERY ROOM!!** ? ?IF YOU WILL BE ADMITTED INTO THE HOSPITAL YOU ARE ALLOWED ONLY FOUR SUPPORT PEOPLE DURING VISITATION HOURS ONLY (7 AM -8PM)   ?The support person(s) must pass our screening, gel in and out ?Visitors GUEST BADGE MUST BE WORN VISIBLY  ?One adult visitor may remain with you overnight and MUST be in the room by 8 P.M.  ? ?You are not required to quarantine ?Hand Hygiene often ?Do NOT share personal items ?Notify your provider if you are in close contact with someone who has COVID or you develop fever 100.4 or greater, new onset of sneezing, cough, sore throat, shortness of breath or body aches. ? ?     ? Your procedure is scheduled on:  04-13-22 ? ? Report to [facility] Main Entrance ? ?  Report to admitting at 7:45 AM ? ? Call this number if you have problems the morning of surgery [phone] ? ? Do not eat food :After Midnight. ? ? After Midnight you may have the following liquids until 7:30 AM DAY OF SURGERY ? ?Water ?Black Coffee (sugar ok, NO MILK/CREAM OR CREAMERS)  ?Tea (sugar ok, NO MILK/CREAM OR CREAMERS) regular and decaf                             ?Plain Jell-O (NO RED)                                           ?Fruit ices (not with fruit pulp, NO RED)                                     ?Popsicles (NO RED)                                                                  ?Juice: apple, WHITE grape, WHITE cranberry ?Sports drinks like Gatorade (NO RED) ?Clear broth(vegetable,chicken,beef) ? ?             ?  ?  ?The day of surgery:  ?Drink ONE (1) Pre-Surgery G2 at 7:30 AM the morning of surgery. Drink in one sitting. Do not sip.  ?This drink was given to you during your hospital  ?pre-op appointment visit. ?Nothing else to drink after completing the Pre-Surgery G2. ?  ?       If  you have questions, please contact your surgeon?s office. ? ? ?FOLLOW ANY ADDITIONAL PRE OP INSTRUCTIONS YOU RECEIVED FROM YOUR SURGEON'S OFFICE!!! ?  ?  ?Oral Hygiene is also important to reduce your risk of infection.                                    ?Remember - BRUSH YOUR TEETH THE  MORNING OF SURGERY WITH YOUR REGULAR TOOTHPASTE ? ? Do NOT smoke after Midnight ? ? Take these medicines the morning of surgery with A SIP OF WATER: Alprazolam, Amlodipine, Atorvastatin, Gabapentin, Lamotrigine ?                  ?           You may not have any metal on your body including hair pins, jewelry, and body piercing ? ?           Do not wear make-up, lotions, powders, perfumes or deodorant ? ?Do not wear nail polish including gel and S&S, artificial/acrylic nails, or any other type of covering on natural nails including finger and toenails. If you have artificial nails, gel coating, etc. that needs to be removed by a nail salon please have this removed prior to surgery or surgery may need to be canceled/ delayed if the surgeon/ anesthesia feels like they are unable to be safely monitored.  ? ?Do not shave  48 hours prior to surgery.  ? ? Do not bring valuables to the hospital. [place]. ? ? Contacts, dentures or bridgework may not be worn into surgery. ? ? Bring small overnight bag day of surgery. ? ?Please read over the following fact sheets you were given: IF [place]  ? ?[place] - Preparing for Surgery ?Before surgery, you can play an important role.  Because skin is not sterile, your skin needs to be as free of germs as possible.  You can reduce the number of germs on your skin by washing with CHG (chlorahexidine gluconate) soap before surgery.  CHG is an antiseptic cleaner which kills germs and bonds with the skin to continue killing germs even after washing. ?Please DO NOT use if you have an allergy to  CHG or antibacterial soaps.  If your skin becomes reddened/irritated stop using the CHG and inform your nurse when you arrive at Short Stay. ?Do not shave (including legs and underarms) for at least 48 hours prior to the first CHG shower.  You may shave your face/neck. ? ?Please follow these instructions carefully: ? 1.  Shower with CHG Soap the night before surgery and the  morning of surgery. ? 2.  If you choose to wash your hair, wash your hair first as usual with your normal  shampoo. ? 3.  After you shampoo, rinse your hair and body thoroughly to remove the shampoo.                            ? 4.  Use CHG as you would any other liquid soap.  You can apply chg directly to the skin and wash.  Gently with a scrungie or clean washcloth. ? 5.  Apply the CHG Soap to your body ONLY FROM THE NECK DOWN.   Do   not use on face/ open      ?                     Wound or open sores. Avoid contact with eyes, ears mouth and   genitals (private parts).  ?                     Production manager,  Genitals (private parts) with your normal soap. ?  6.  Wash thoroughly, paying special attention to the area where your    surgery  will be performed. ? 7.  Thoroughly rinse your body with warm water from the neck down. ? 8.  DO NOT shower/wash with your normal soap after using and rinsing off the CHG Soap. ?               9.  Pat yourself dry with a clean towel. ?           10.  Wear clean pajamas. ?           11.  Place clean sheets on your bed the night of your first shower and do not  sleep with pets. ?Day of Surgery : ?Do not apply any lotions/deodorants the morning of surgery.  Please wear clean clothes to the hospital/surgery center. ? ?FAILURE TO FOLLOW THESE INSTRUCTIONS MAY RESULT IN THE CANCELLATION OF YOUR SURGERY ? ?PATIENT SIGNATURE_________________________________ ? ?NURSE SIGNATURE__________________________________ ? ?________________________________________________________________________  ?  ? ?Incentive  Spirometer ? ?An incentive spirometer is a tool that can help keep your lungs clear and active. This tool measures how well you are filling your lungs with each breath. Taking long deep breaths may help reverse or decrease the chance of developing breathing (pulmonary) problems (especially infection) following: ?A long period of time when you are unable to move or be active. ?BEFORE THE PROCEDURE  ?If the spirometer includes an indicator to show your best effort, your nurse or respiratory therapist will set it to a desired goal. ?If possible, sit up straight or lean slightly forward. Try not to slouch. ?Hold the incentive spirometer in an upright position. ?INSTRUCTIONS FOR USE  ?Sit on the edge of your bed if possible, or sit up as far as you can in bed or on a chair. ?Hold the incentive spirometer in an upright position. ?Breathe out normally. ?Place the mouthpiece in your mouth and seal your lips tightly around it. ?Breathe in slowly and as deeply as possible, raising the piston or the ball toward the top of the column. ?Hold your breath for 3-5 seconds or for as long as possible. Allow the piston or ball to fall to the bottom of the column. ?Remove the mouthpiece from your mouth and breathe out normally. ?Rest for a few seconds and repeat Steps 1 through 7 at least 10 times every 1-2 hours when you are awake. Take your time and take a few normal breaths between deep breaths. ?The spirometer may include an indicator to show your best effort. Use the indicator as a goal to work toward during each repetition. ?After each set of 10 deep breaths, practice coughing to be sure your lungs are clear. If you have an incision (the cut made at the time of surgery), support your incision when coughing by placing a pillow or rolled up towels firmly against it. ?Once you are able to get out of bed, walk around indoors and cough well. You may stop using the incentive spirometer when instructed by your caregiver.  ?RISKS AND  COMPLICATIONS ?Take your time so you do not get dizzy or light-headed. ?If you are in pain, you may need to take or ask for pain medication before doing incentive spirometry. It is harder to take a deep breath if you

## 2022-03-31 ENCOUNTER — Encounter (HOSPITAL_COMMUNITY)
Admission: RE | Admit: 2022-03-31 | Discharge: 2022-03-31 | Disposition: A | Discharge status: Home / Self Care | Payer: Medicare Other | Source: Ambulatory Visit | Attending: Orthopedic Surgery | Admitting: Orthopedic Surgery

## 2022-03-31 ENCOUNTER — Other Ambulatory Visit: Payer: Self-pay

## 2022-03-31 ENCOUNTER — Encounter (HOSPITAL_COMMUNITY): Payer: Self-pay

## 2022-03-31 VITALS — BP 137/72 | HR 58 | Temp 98.1°F | Resp 18 | Ht 68.0 in | Wt 253.4 lb

## 2022-03-31 DIAGNOSIS — R7303 Prediabetes: Secondary | ICD-10-CM | POA: Insufficient documentation

## 2022-03-31 DIAGNOSIS — Z01812 Encounter for preprocedural laboratory examination: Secondary | ICD-10-CM | POA: Diagnosis not present

## 2022-03-31 DIAGNOSIS — I2510 Atherosclerotic heart disease of native coronary artery without angina pectoris: Secondary | ICD-10-CM

## 2022-03-31 DIAGNOSIS — Z01818 Encounter for other preprocedural examination: Secondary | ICD-10-CM

## 2022-03-31 HISTORY — DX: Unspecified malignant neoplasm of skin of other part of trunk: C44.509

## 2022-03-31 HISTORY — DX: Bipolar disorder, unspecified: F31.9

## 2022-03-31 HISTORY — DX: Gastro-esophageal reflux disease without esophagitis: K21.9

## 2022-03-31 HISTORY — DX: Prediabetes: R73.03

## 2022-03-31 HISTORY — DX: Unspecified osteoarthritis, unspecified site: M19.90

## 2022-03-31 LAB — BASIC METABOLIC PANEL
Anion gap: 6 (ref 5–15)
BUN: 15 mg/dL (ref 8–23)
CO2: 35 mmol/L — ABNORMAL HIGH (ref 22–32)
Calcium: 9.8 mg/dL (ref 8.9–10.3)
Chloride: 103 mmol/L (ref 98–111)
Creatinine, Ser: 1.09 mg/dL — ABNORMAL HIGH (ref 0.44–1.00)
GFR, Estimated: 57 mL/min — ABNORMAL LOW (ref >60)
Glucose, Bld: 123 mg/dL — ABNORMAL HIGH (ref 70–99)
Potassium: 4.9 mmol/L (ref 3.5–5.1)
Sodium: 144 mmol/L (ref 135–145)

## 2022-03-31 LAB — SURGICAL PCR SCREEN
MRSA, PCR: NEGATIVE
Staphylococcus aureus: POSITIVE — AB

## 2022-03-31 LAB — CBC
HCT: 45.3 % (ref 36.0–46.0)
Hemoglobin: 14.5 g/dL (ref 12.0–15.0)
MCH: 28.8 pg (ref 26.0–34.0)
MCHC: 32 g/dL (ref 30.0–36.0)
MCV: 90.1 fL (ref 80.0–100.0)
Platelets: 323 10*3/uL (ref 150–400)
RBC: 5.03 MIL/uL (ref 3.87–5.11)
RDW: 13.6 % (ref 11.5–15.5)
WBC: 6 10*3/uL (ref 4.0–10.5)
nRBC: 0 % (ref 0.0–0.2)

## 2022-03-31 LAB — HEMOGLOBIN A1C
Hgb A1c MFr Bld: 6.6 % — ABNORMAL HIGH (ref 4.8–5.6)
Mean Plasma Glucose: 142.72 mg/dL

## 2022-03-31 LAB — GLUCOSE, CAPILLARY: Glucose-Capillary: 137 mg/dL — ABNORMAL HIGH (ref 70–99)

## 2022-03-31 NOTE — Progress Notes (Signed)
PCR results sent to Dr. Zachery Dakins to review.   ?

## 2022-04-07 NOTE — Care Plan (Signed)
Ortho Bundle Case Management Note ? ?Patient Details  ?Name: Debbie Steele ?MRN: [id number] ?Date of Birth: 16-Apr-1956 ? ?  Met with patient in the office prior to surgery. She will discharge to home with family to assist. Has equipment at home. OPPT set up with [place]. Patient and MD in agreement. Choice offered               ? ? ? ?DME Arranged:    ?DME Agency:    ? ?HH Arranged:    ?[place] Agency:    ? ?Additional Comments: ?Please contact me with any questions of if this plan should need to change. ? ?Mardelle Matte  [facility] Orthopaedic Specialist  [phone] ?04/07/2022, 9:01 AM ?  ?

## 2022-04-13 ENCOUNTER — Encounter (HOSPITAL_COMMUNITY)
Admission: RE | Disposition: A | Discharge status: Home / Self Care | Payer: Self-pay | Source: Home / Self Care | Attending: Orthopedic Surgery

## 2022-04-13 ENCOUNTER — Ambulatory Visit (HOSPITAL_COMMUNITY): Payer: Medicare Other | Admitting: Physician Assistant

## 2022-04-13 ENCOUNTER — Observation Stay (HOSPITAL_COMMUNITY)
Admission: RE | Admit: 2022-04-13 | Discharge: 2022-04-14 | Disposition: A | Discharge status: Home / Self Care | Payer: Medicare Other | Attending: Orthopedic Surgery | Admitting: Orthopedic Surgery

## 2022-04-13 ENCOUNTER — Ambulatory Visit (HOSPITAL_COMMUNITY): Payer: Medicare Other

## 2022-04-13 ENCOUNTER — Other Ambulatory Visit: Payer: Self-pay

## 2022-04-13 ENCOUNTER — Encounter (HOSPITAL_COMMUNITY): Payer: Self-pay | Admitting: Orthopedic Surgery

## 2022-04-13 ENCOUNTER — Ambulatory Visit (HOSPITAL_BASED_OUTPATIENT_CLINIC_OR_DEPARTMENT_OTHER): Payer: Medicare Other | Admitting: Anesthesiology

## 2022-04-13 DIAGNOSIS — F17290 Nicotine dependence, other tobacco product, uncomplicated: Secondary | ICD-10-CM | POA: Diagnosis not present

## 2022-04-13 DIAGNOSIS — Z96642 Presence of left artificial hip joint: Secondary | ICD-10-CM | POA: Diagnosis not present

## 2022-04-13 DIAGNOSIS — Z7984 Long term (current) use of oral hypoglycemic drugs: Secondary | ICD-10-CM | POA: Insufficient documentation

## 2022-04-13 DIAGNOSIS — Z79899 Other long term (current) drug therapy: Secondary | ICD-10-CM | POA: Diagnosis not present

## 2022-04-13 DIAGNOSIS — Z85828 Personal history of other malignant neoplasm of skin: Secondary | ICD-10-CM | POA: Insufficient documentation

## 2022-04-13 DIAGNOSIS — M1712 Unilateral primary osteoarthritis, left knee: Secondary | ICD-10-CM

## 2022-04-13 DIAGNOSIS — G709 Myoneural disorder, unspecified: Secondary | ICD-10-CM | POA: Diagnosis not present

## 2022-04-13 DIAGNOSIS — I351 Nonrheumatic aortic (valve) insufficiency: Secondary | ICD-10-CM

## 2022-04-13 DIAGNOSIS — Z471 Aftercare following joint replacement surgery: Secondary | ICD-10-CM | POA: Diagnosis not present

## 2022-04-13 DIAGNOSIS — M25462 Effusion, left knee: Secondary | ICD-10-CM | POA: Diagnosis not present

## 2022-04-13 DIAGNOSIS — G8918 Other acute postprocedural pain: Secondary | ICD-10-CM | POA: Diagnosis not present

## 2022-04-13 DIAGNOSIS — I10 Essential (primary) hypertension: Secondary | ICD-10-CM | POA: Diagnosis not present

## 2022-04-13 DIAGNOSIS — R7303 Prediabetes: Secondary | ICD-10-CM | POA: Diagnosis not present

## 2022-04-13 DIAGNOSIS — M7989 Other specified soft tissue disorders: Secondary | ICD-10-CM | POA: Diagnosis not present

## 2022-04-13 HISTORY — PX: TOTAL KNEE ARTHROPLASTY: SHX125

## 2022-04-13 LAB — GLUCOSE, CAPILLARY: Glucose-Capillary: 170 mg/dL — ABNORMAL HIGH (ref 70–99)

## 2022-04-13 SURGERY — ARTHROPLASTY, KNEE, TOTAL
Anesthesia: Spinal | Site: Knee | Laterality: Left

## 2022-04-13 MED ORDER — MIDAZOLAM HCL 2 MG/2ML IJ SOLN
INTRAMUSCULAR | Status: AC
  Filled 2022-04-13: qty 2

## 2022-04-13 MED ORDER — ORAL CARE MOUTH RINSE: 15.0000 mL | Freq: Once | OROMUCOSAL | Status: AC

## 2022-04-13 MED ORDER — SODIUM CHLORIDE (PF) 0.9 % IJ SOLN
INTRAMUSCULAR | Status: AC
  Filled 2022-04-13: qty 50

## 2022-04-13 MED ORDER — DIPHENHYDRAMINE HCL 12.5 MG/5ML PO ELIX: 12.5000 mg | ORAL_SOLUTION | ORAL | Status: DC | PRN

## 2022-04-13 MED ORDER — ACETAMINOPHEN 500 MG PO TABS
1000.0000 mg | ORAL_TABLET | Freq: Four times a day (QID) | ORAL | Status: AC
  Administered 2022-04-13 – 2022-04-14 (×4): 1000 mg via ORAL
  Filled 2022-04-13 (×4): qty 2

## 2022-04-13 MED ORDER — LACTATED RINGERS IV SOLN: INTRAVENOUS | Status: DC

## 2022-04-13 MED ORDER — BUPIVACAINE LIPOSOME 1.3 % IJ SUSP: 20.0000 mL | Freq: Once | INTRAMUSCULAR | Status: DC

## 2022-04-13 MED ORDER — HYDROMORPHONE HCL 1 MG/ML IJ SOLN: 0.5000 mg | INTRAMUSCULAR | Status: DC | PRN

## 2022-04-13 MED ORDER — FENTANYL CITRATE PF 50 MCG/ML IJ SOSY
PREFILLED_SYRINGE | INTRAMUSCULAR | Status: AC
  Filled 2022-04-13: qty 2

## 2022-04-13 MED ORDER — ONDANSETRON HCL 4 MG/2ML IJ SOLN: 4.0000 mg | Freq: Four times a day (QID) | INTRAMUSCULAR | Status: DC | PRN

## 2022-04-13 MED ORDER — METHOCARBAMOL 500 MG PO TABS: 500.0000 mg | ORAL_TABLET | Freq: Three times a day (TID) | ORAL | 0 refills | Status: AC | PRN

## 2022-04-13 MED ORDER — MIDAZOLAM HCL 2 MG/2ML IJ SOLN
1.0000 mg | INTRAMUSCULAR | Status: DC
  Administered 2022-04-13: 1.5 mg via INTRAVENOUS

## 2022-04-13 MED ORDER — SODIUM CHLORIDE 0.9% FLUSH
INTRAVENOUS | Status: DC | PRN
  Administered 2022-04-13: 60 mL

## 2022-04-13 MED ORDER — CHLORHEXIDINE GLUCONATE 0.12 % MT SOLN
15.0000 mL | Freq: Once | OROMUCOSAL | Status: AC
  Administered 2022-04-13: 15 mL via OROMUCOSAL

## 2022-04-13 MED ORDER — SODIUM CHLORIDE (PF) 0.9 % IJ SOLN
INTRAMUSCULAR | Status: AC
  Filled 2022-04-13: qty 10

## 2022-04-13 MED ORDER — POLYETHYLENE GLYCOL 3350 17 G PO PACK: 17.0000 g | PACK | Freq: Every day | ORAL | Status: DC | PRN

## 2022-04-13 MED ORDER — GABAPENTIN 400 MG PO CAPS: 400.0000 mg | ORAL_CAPSULE | ORAL | Status: DC

## 2022-04-13 MED ORDER — CEFAZOLIN SODIUM-DEXTROSE 2-4 GM/100ML-% IV SOLN
2.0000 g | INTRAVENOUS | Status: AC
  Administered 2022-04-13: 2 g via INTRAVENOUS
  Filled 2022-04-13: qty 100

## 2022-04-13 MED ORDER — FENTANYL CITRATE PF 50 MCG/ML IJ SOSY
50.0000 ug | PREFILLED_SYRINGE | INTRAMUSCULAR | Status: DC
  Administered 2022-04-13: 50 ug via INTRAVENOUS

## 2022-04-13 MED ORDER — OXYCODONE HCL 5 MG PO TABS: 5.0000 mg | ORAL_TABLET | ORAL | 0 refills | Status: DC | PRN

## 2022-04-13 MED ORDER — BUPIVACAINE LIPOSOME 1.3 % IJ SUSP
INTRAMUSCULAR | Status: AC
  Filled 2022-04-13: qty 20

## 2022-04-13 MED ORDER — OXYCODONE HCL 5 MG PO TABS
5.0000 mg | ORAL_TABLET | ORAL | Status: DC | PRN
  Administered 2022-04-13 – 2022-04-14 (×4): 5 mg via ORAL
  Filled 2022-04-13 (×4): qty 1

## 2022-04-13 MED ORDER — ATORVASTATIN CALCIUM 40 MG PO TABS
40.0000 mg | ORAL_TABLET | Freq: Every day | ORAL | Status: DC
  Administered 2022-04-14: 40 mg via ORAL
  Filled 2022-04-13: qty 1

## 2022-04-13 MED ORDER — KETOROLAC TROMETHAMINE 15 MG/ML IJ SOLN
7.5000 mg | Freq: Four times a day (QID) | INTRAMUSCULAR | Status: AC
  Administered 2022-04-13 – 2022-04-14 (×4): 7.5 mg via INTRAVENOUS
  Filled 2022-04-13 (×4): qty 1

## 2022-04-13 MED ORDER — PANTOPRAZOLE SODIUM 40 MG PO TBEC
40.0000 mg | DELAYED_RELEASE_TABLET | Freq: Every day | ORAL | Status: DC
  Administered 2022-04-13 – 2022-04-14 (×2): 40 mg via ORAL
  Filled 2022-04-13 (×2): qty 1

## 2022-04-13 MED ORDER — ATENOLOL-CHLORTHALIDONE 50-25 MG PO TABS: 1.0000 | ORAL_TABLET | Freq: Every day | ORAL | Status: DC

## 2022-04-13 MED ORDER — PHENOL 1.4 % MT LIQD: 1.0000 | OROMUCOSAL | Status: DC | PRN

## 2022-04-13 MED ORDER — POVIDONE-IODINE 10 % EX SWAB
2.0000 "application " | Freq: Once | CUTANEOUS | Status: AC
  Administered 2022-04-13: 2 via TOPICAL

## 2022-04-13 MED ORDER — TRANEXAMIC ACID-NACL 1000-0.7 MG/100ML-% IV SOLN
1000.0000 mg | INTRAVENOUS | Status: AC
  Administered 2022-04-13: 1000 mg via INTRAVENOUS
  Filled 2022-04-13: qty 100

## 2022-04-13 MED ORDER — MELATONIN 5 MG PO TABS
20.0000 mg | ORAL_TABLET | Freq: Every evening | ORAL | Status: DC | PRN
  Administered 2022-04-13: 20 mg via ORAL
  Filled 2022-04-13: qty 4

## 2022-04-13 MED ORDER — ONDANSETRON HCL 4 MG PO TABS: 4.0000 mg | ORAL_TABLET | Freq: Three times a day (TID) | ORAL | 0 refills | Status: AC | PRN

## 2022-04-13 MED ORDER — ACETAMINOPHEN 325 MG PO TABS: 325.0000 mg | ORAL_TABLET | Freq: Four times a day (QID) | ORAL | Status: DC | PRN

## 2022-04-13 MED ORDER — ASPIRIN 81 MG PO CHEW
81.0000 mg | CHEWABLE_TABLET | Freq: Two times a day (BID) | ORAL | Status: DC
  Administered 2022-04-13 – 2022-04-14 (×2): 81 mg via ORAL
  Filled 2022-04-13 (×2): qty 1

## 2022-04-13 MED ORDER — FENTANYL CITRATE PF 50 MCG/ML IJ SOSY: 25.0000 ug | PREFILLED_SYRINGE | INTRAMUSCULAR | Status: DC | PRN

## 2022-04-13 MED ORDER — GABAPENTIN 400 MG PO CAPS
400.0000 mg | ORAL_CAPSULE | Freq: Every morning | ORAL | Status: DC
  Administered 2022-04-14: 400 mg via ORAL
  Filled 2022-04-13: qty 1

## 2022-04-13 MED ORDER — METHOCARBAMOL 500 MG IVPB - SIMPLE MED
500.0000 mg | Freq: Four times a day (QID) | INTRAVENOUS | Status: DC | PRN
  Filled 2022-04-13: qty 50

## 2022-04-13 MED ORDER — CEFAZOLIN SODIUM-DEXTROSE 2-4 GM/100ML-% IV SOLN
2.0000 g | Freq: Four times a day (QID) | INTRAVENOUS | Status: AC
  Administered 2022-04-13 (×2): 2 g via INTRAVENOUS
  Filled 2022-04-13 (×2): qty 100

## 2022-04-13 MED ORDER — BUPIVACAINE LIPOSOME 1.3 % IJ SUSP
INTRAMUSCULAR | Status: DC | PRN
  Administered 2022-04-13: 20 mL

## 2022-04-13 MED ORDER — AMLODIPINE BESYLATE 10 MG PO TABS: 10.0000 mg | ORAL_TABLET | Freq: Every day | ORAL | Status: DC | PRN

## 2022-04-13 MED ORDER — 0.9 % SODIUM CHLORIDE (POUR BTL) OPTIME
TOPICAL | Status: DC | PRN
  Administered 2022-04-13: 1000 mL

## 2022-04-13 MED ORDER — BUPIVACAINE IN DEXTROSE 0.75-8.25 % IT SOLN
INTRATHECAL | Status: DC | PRN
  Administered 2022-04-13: 1.8 mL via INTRATHECAL

## 2022-04-13 MED ORDER — MENTHOL 3 MG MT LOZG: 1.0000 | LOZENGE | OROMUCOSAL | Status: DC | PRN

## 2022-04-13 MED ORDER — PROPOFOL 500 MG/50ML IV EMUL
INTRAVENOUS | Status: DC | PRN
  Administered 2022-04-13: 100 ug/kg/min via INTRAVENOUS
  Administered 2022-04-13: 30 mg via INTRAVENOUS

## 2022-04-13 MED ORDER — WATER FOR IRRIGATION, STERILE IR SOLN
Status: DC | PRN
  Administered 2022-04-13: 2000 mL

## 2022-04-13 MED ORDER — LAMOTRIGINE 100 MG PO TABS
200.0000 mg | ORAL_TABLET | Freq: Every day | ORAL | Status: DC
  Administered 2022-04-14: 200 mg via ORAL
  Filled 2022-04-13: qty 2

## 2022-04-13 MED ORDER — DEXAMETHASONE SODIUM PHOSPHATE 10 MG/ML IJ SOLN
INTRAMUSCULAR | Status: DC | PRN
  Administered 2022-04-13: 10 mg via INTRAVENOUS

## 2022-04-13 MED ORDER — ACETAMINOPHEN 500 MG PO TABS
1000.0000 mg | ORAL_TABLET | Freq: Once | ORAL | Status: AC
  Administered 2022-04-13: 1000 mg via ORAL
  Filled 2022-04-13: qty 2

## 2022-04-13 MED ORDER — ASPIRIN EC 81 MG PO TBEC: 81.0000 mg | DELAYED_RELEASE_TABLET | Freq: Two times a day (BID) | ORAL | 0 refills | Status: AC

## 2022-04-13 MED ORDER — POVIDONE-IODINE 10 % EX SWAB: 2.0000 "application " | Freq: Once | CUTANEOUS | Status: DC

## 2022-04-13 MED ORDER — DOCUSATE SODIUM 100 MG PO CAPS
100.0000 mg | ORAL_CAPSULE | Freq: Two times a day (BID) | ORAL | Status: DC
  Administered 2022-04-13 – 2022-04-14 (×2): 100 mg via ORAL
  Filled 2022-04-13 (×2): qty 1

## 2022-04-13 MED ORDER — PHENYLEPHRINE 80 MCG/ML (10ML) SYRINGE FOR IV PUSH (FOR BLOOD PRESSURE SUPPORT)
PREFILLED_SYRINGE | INTRAVENOUS | Status: DC | PRN
  Administered 2022-04-13: 80 ug via INTRAVENOUS

## 2022-04-13 MED ORDER — ATENOLOL 50 MG PO TABS
50.0000 mg | ORAL_TABLET | Freq: Every day | ORAL | Status: DC
  Administered 2022-04-14: 50 mg via ORAL
  Filled 2022-04-13: qty 1

## 2022-04-13 MED ORDER — BUPIVACAINE-EPINEPHRINE (PF) 0.5% -1:200000 IJ SOLN
INTRAMUSCULAR | Status: DC | PRN
  Administered 2022-04-13: 25 mL via PERINEURAL

## 2022-04-13 MED ORDER — SODIUM CHLORIDE 0.9 % IR SOLN
Status: DC | PRN
  Administered 2022-04-13: 3000 mL

## 2022-04-13 MED ORDER — CHLORTHALIDONE 25 MG PO TABS
25.0000 mg | ORAL_TABLET | Freq: Every day | ORAL | Status: DC
  Administered 2022-04-14: 25 mg via ORAL
  Filled 2022-04-13: qty 1

## 2022-04-13 MED ORDER — ACETAMINOPHEN 500 MG PO TABS: 1000.0000 mg | ORAL_TABLET | Freq: Three times a day (TID) | ORAL | 0 refills | Status: AC | PRN

## 2022-04-13 MED ORDER — LACTATED RINGERS IV SOLN
INTRAVENOUS | Status: DC
Start: 2022-04-13 — End: 2022-04-13

## 2022-04-13 MED ORDER — METHOCARBAMOL 500 MG PO TABS
500.0000 mg | ORAL_TABLET | Freq: Four times a day (QID) | ORAL | Status: DC | PRN
  Administered 2022-04-13 – 2022-04-14 (×2): 500 mg via ORAL
  Filled 2022-04-13 (×2): qty 1

## 2022-04-13 MED ORDER — GABAPENTIN 400 MG PO CAPS
800.0000 mg | ORAL_CAPSULE | Freq: Every day | ORAL | Status: DC
  Administered 2022-04-13: 800 mg via ORAL
  Filled 2022-04-13: qty 2

## 2022-04-13 MED ORDER — ONDANSETRON HCL 4 MG PO TABS: 4.0000 mg | ORAL_TABLET | Freq: Four times a day (QID) | ORAL | Status: DC | PRN

## 2022-04-13 SURGICAL SUPPLY — 62 items
ADH SKN CLS APL DERMABOND .7 (GAUZE/BANDAGES/DRESSINGS) ×1
APL PRP STRL LF DISP 70% ISPRP (MISCELLANEOUS) ×2
BAG COUNTER SPONGE SURGICOUNT (BAG) ×1 IMPLANT
BAG SPNG CNTER NS LX DISP (BAG) ×1
BLADE SAG 18X100X1.27 (BLADE) ×2 IMPLANT
BLADE SAW SAG 35X64 .89 (BLADE) ×2 IMPLANT
BNDG CMPR 5X3 CHSV STRCH STRL (GAUZE/BANDAGES/DRESSINGS) ×1
BNDG CMPR MED 10X6 ELC LF (GAUZE/BANDAGES/DRESSINGS) ×1
BNDG COHESIVE 3X5 TAN ST LF (GAUZE/BANDAGES/DRESSINGS) ×2 IMPLANT
BNDG ELASTIC 6X10 VLCR STRL LF (GAUZE/BANDAGES/DRESSINGS) ×2 IMPLANT
BOWL SMART MIX CTS (DISPOSABLE) ×2 IMPLANT
BSPLAT TIB 5D E CMNT STM LT (Knees) ×1 IMPLANT
CEMENT BONE REFOBACIN R1X40 US (Cement) ×2 IMPLANT
CHLORAPREP W/TINT 26 (MISCELLANEOUS) ×4 IMPLANT
COMP FEM CEMT PERSONA STD SZ7 (Knees) ×2 IMPLANT
COMPONENT FEM CMT PRSN STD SZ7 (Knees) IMPLANT
COVER SURGICAL LIGHT HANDLE (MISCELLANEOUS) ×2 IMPLANT
CUFF TOURN SGL QUICK 34 (TOURNIQUET CUFF) ×2
CUFF TRNQT CYL 34X4.125X (TOURNIQUET CUFF) ×1 IMPLANT
DERMABOND ADVANCED (GAUZE/BANDAGES/DRESSINGS) ×1
DERMABOND ADVANCED .7 DNX12 (GAUZE/BANDAGES/DRESSINGS) ×1 IMPLANT
DRAPE INCISE IOBAN 85X60 (DRAPES) ×2 IMPLANT
DRAPE SHEET LG 3/4 BI-LAMINATE (DRAPES) ×2 IMPLANT
DRAPE U-SHAPE 47X51 STRL (DRAPES) ×2 IMPLANT
DRESSING AQUACEL AG SP 3.5X10 (GAUZE/BANDAGES/DRESSINGS) ×1 IMPLANT
DRSG AQUACEL AG SP 3.5X10 (GAUZE/BANDAGES/DRESSINGS) ×2
GAUZE SPONGE 4X4 12PLY STRL (GAUZE/BANDAGES/DRESSINGS) ×2 IMPLANT
GLOVE BIO SURGEON STRL SZ8 (GLOVE) ×4 IMPLANT
GLOVE BIOGEL PI IND STRL 8 (GLOVE) ×1 IMPLANT
GLOVE BIOGEL PI INDICATOR 8 (GLOVE) ×1
GOWN STRL REUS W/ TWL XL LVL3 (GOWN DISPOSABLE) ×1 IMPLANT
GOWN STRL REUS W/TWL XL LVL3 (GOWN DISPOSABLE) ×2
HANDPIECE INTERPULSE COAX TIP (DISPOSABLE) ×2
HDLS TROCR DRIL PIN KNEE 75 (PIN) ×2
HOOD PEEL AWAY FLYTE STAYCOOL (MISCELLANEOUS) ×6 IMPLANT
MANIFOLD NEPTUNE II (INSTRUMENTS) ×2 IMPLANT
MARKER SKIN DUAL TIP RULER LAB (MISCELLANEOUS) ×2 IMPLANT
NS IRRIG 1000ML POUR BTL (IV SOLUTION) ×2 IMPLANT
PACK TOTAL KNEE CUSTOM (KITS) ×2 IMPLANT
PIN DRILL HDLS TROCAR 75 4PK (PIN) IMPLANT
PROTECTOR NERVE ULNAR (MISCELLANEOUS) ×2 IMPLANT
SCREW HEADED 33MM KNEE (MISCELLANEOUS) ×2 IMPLANT
SET HNDPC FAN SPRY TIP SCT (DISPOSABLE) ×1 IMPLANT
SOLUTION IRRIG SURGIPHOR (IV SOLUTION) IMPLANT
SPIKE FLUID TRANSFER (MISCELLANEOUS) ×2 IMPLANT
STEM MED AS PERS SZ6-7 11 (Stem) ×1 IMPLANT
STEM POLY PAT PLY 32M KNEE (Knees) ×1 IMPLANT
STEM TIBIA 5 DEG SZ E L KNEE (Knees) IMPLANT
STRIP CLOSURE SKIN 1/2X4 (GAUZE/BANDAGES/DRESSINGS) ×2 IMPLANT
SUT MNCRL AB 3-0 PS2 18 (SUTURE) ×2 IMPLANT
SUT STRATAFIX 0 PDS 27 VIOLET (SUTURE) ×2
SUT STRATAFIX PDO 1 14 VIOLET (SUTURE) ×2
SUT STRATFX PDO 1 14 VIOLET (SUTURE) ×1
SUT VIC AB 2-0 CT2 27 (SUTURE) ×4 IMPLANT
SUTURE STRATFX 0 PDS 27 VIOLET (SUTURE) ×1 IMPLANT
SUTURE STRATFX PDO 1 14 VIOLET (SUTURE) ×1 IMPLANT
SYR 50ML LL SCALE MARK (SYRINGE) ×2 IMPLANT
TIBIA STEM 5 DEG SZ E L KNEE (Knees) ×2 IMPLANT
TRAY FOLEY MTR SLVR 14FR STAT (SET/KITS/TRAYS/PACK) ×1 IMPLANT
TUBE SUCTION HIGH CAP CLEAR NV (SUCTIONS) ×2 IMPLANT
UNDERPAD 30X36 HEAVY ABSORB (UNDERPADS AND DIAPERS) ×2 IMPLANT
WRAP KNEE MAXI GEL POST OP (GAUZE/BANDAGES/DRESSINGS) ×1 IMPLANT

## 2022-04-13 NOTE — Anesthesia Preprocedure Evaluation (Addendum)
Anesthesia Evaluation  ?Patient identified by MRN, date of birth, ID band ?Patient awake ? ? ? ?Reviewed: ?Allergy & Precautions, NPO status , Patient's Chart, lab work & pertinent test results ? ?Airway ?Mallampati: I ? ?TM Distance: >3 FB ? ? ? ? Dental ?  ?Pulmonary ?Current Smoker and Patient abstained from smoking.,  ?  ?breath sounds clear to auscultation ? ? ? ? ? ? Cardiovascular ?hypertension, + Valvular Problems/Murmurs AI  ?Rhythm:Regular Rate:Normal ? ?History noted ?Dr. Nyoka Cowden ?  ?Neuro/Psych ?PSYCHIATRIC DISORDERS  Neuromuscular disease   ? GI/Hepatic ?Neg liver ROS, GERD  ,  ?Endo/Other  ?negative endocrine ROS ? Renal/GU ?negative Renal ROS  ? ?  ?Musculoskeletal ? ? Abdominal ?  ?Peds ? Hematology ?  ?Anesthesia Other Findings ? ? Reproductive/Obstetrics ? ?  ? ? ? ? ? ? ? ? ? ? ? ? ? ?  ?  ? ? ? ? ? ? ? ?Anesthesia Physical ?Anesthesia Plan ? ?ASA: 3 ? ?Anesthesia Plan: Spinal  ? ?Post-op Pain Management: Regional block*  ? ?Induction: Intravenous ? ?PONV Risk Score and Plan: Dexamethasone, Ondansetron and Midazolam ? ?Airway Management Planned: Simple Face Mask ? ?Additional Equipment:  ? ?Intra-op Plan:  ? ?Post-operative Plan:  ? ?Informed Consent: I have reviewed the patients History and Physical, chart, labs and discussed the procedure including the risks, benefits and alternatives for the proposed anesthesia with the patient or authorized representative who has indicated his/her understanding and acceptance.  ? ? ? ?Dental advisory given ? ?Plan Discussed with: Anesthesiologist and CRNA ? ?Anesthesia Plan Comments:   ? ? ? ? ? ?Anesthesia Quick Evaluation ? ?

## 2022-04-13 NOTE — Interval H&P Note (Signed)
History and Physical Interval Note: ? ?04/13/2022 ?11:11 AM ? ?Debbie Steele  has presented today for surgery, with the diagnosis of OA LEFT KNEE.  The various methods of treatment have been discussed with the patient and family. After consideration of risks, benefits and other options for treatment, the patient has consented to  Procedure(s): ?TOTAL KNEE ARTHROPLASTY (Left) as a surgical intervention.  The patient's history has been reviewed, patient examined, no change in status, stable for surgery.  I have reviewed the patient's chart and labs.  Questions were answered to the patient's satisfaction.   ?History of vape use. Reports no nicotine in vape for the past month. Encouraged her to avoid nicotine for at least the next month. She is in agreement. ? ? ?Raevin Wierenga A Samaya Boardley ? ? ?

## 2022-04-13 NOTE — Progress Notes (Signed)
Orthopedic Tech Progress Note ?Patient Details:  ?Debbie Steele ?07-27-1956 ?[id number] ? ?Ortho Devices ?Type of Ortho Device: Bone foam zero knee ?Ortho Device/Splint Interventions: Application ?  ?Post Interventions ?Patient Tolerated: Well ?Instructions Provided: Care of device ? ?[name] ?04/13/2022, 1:29 PM ? ?

## 2022-04-13 NOTE — Evaluation (Signed)
Physical Therapy Evaluation ?Patient Details ?Name: Debbie Steele ?MRN: [id number] ?DOB: 1956-05-15 ?Today's Date: 04/13/2022 ? ?History of Present Illness ? pt is a 66yo female presenting s/p L-TKA on 04/13/22. PMH: Anxiety & depression, bipolar disorder, GERD, HLD, HTN,  ?Clinical Impression ? [name] is a 66 y.o. female POD 0 s/p L-TKA. Patient reports independence with mobility at baseline. Patient is now limited by functional impairments (see PT problem list below) and requires min guard for transfers and gait with RW. Patient was able to ambulate 40 feet with RW and min guard assist. Patient instructed in exercise to facilitate ROM and circulation to manage edema; demonstrated appropriate incentive spirometry technique. Patient will benefit from continued skilled PT interventions to address impairments and progress towards PLOF. Acute PT will follow to progress mobility and stair training in preparation for safe discharge home.   ?   ? ?Recommendations for follow up therapy are one component of a multi-disciplinary discharge planning process, led by the attending physician.  Recommendations may be updated based on patient status, additional functional criteria and insurance authorization. ? ?Follow Up Recommendations Follow physician's recommendations for discharge plan and follow up therapies ? ?  ?Assistance Recommended at Discharge Set up Supervision/Assistance  ?Patient can return home with the following ? A little help with walking and/or transfers;A little help with bathing/dressing/bathroom;Assistance with cooking/housework;Assist for transportation;Help with stairs or ramp for entrance ? ?  ?Equipment Recommendations None recommended by PT  ?Recommendations for Other Services ?    ?  ?Functional Status Assessment Patient has had a recent decline in their functional status and demonstrates the ability to make significant improvements in function in a reasonable and predictable amount of time.  ? ?   ?Precautions / Restrictions Restrictions ?Weight Bearing Restrictions: Yes ?LLE Weight Bearing: Weight bearing as tolerated  ? ?  ? ?Mobility ? Bed Mobility ?Overal bed mobility: Needs Assistance ?Bed Mobility: Supine to Sit ?  ?  ?Supine to sit: Supervision ?  ?  ?General bed mobility comments: for safety only ?  ? ?Transfers ?Overall transfer level: Needs assistance ?Equipment used: Rolling walker (2 wheels) ?Transfers: Sit to/from Stand ?Sit to Stand: Min guard ?  ?  ?  ?  ?  ?General transfer comment: for safety only, no physical assist required, VCs for powering up through RLE and LUE ?  ? ?Ambulation/Gait ?Ambulation/Gait assistance: Min guard, +2 safety/equipment ?Gait Distance (Feet): 40 Feet ?Assistive device: Rolling walker (2 wheels) ?Gait Pattern/deviations: Step-to pattern ?Gait velocity: decreased ?  ?  ?General Gait Details: pt ambulated 75f with RW and min guard assist, recliner follow for safety only, no overt LOB noted. Pt demonstrated step-to pattern with safe walker management. ? ?Stairs ?  ?  ?  ?  ?  ? ?Wheelchair Mobility ?  ? ?Modified Rankin (Stroke Patients Only) ?  ? ?  ? ?Balance Overall balance assessment: Needs assistance ?Sitting-balance support: Feet supported, No upper extremity supported ?Sitting balance-Leahy Scale: Fair ?  ?  ?Standing balance support: Reliant on assistive device for balance, During functional activity, Bilateral upper extremity supported ?Standing balance-Leahy Scale: Poor ?  ?  ?  ?  ?  ?  ?  ?  ?  ?  ?  ?  ?   ? ? ? ?Pertinent Vitals/Pain Pain Assessment ?Pain Assessment: 0-10 ?Pain Score: 4  ?Pain Location: l knee ?Pain Descriptors / Indicators: Operative site guarding ?Pain Intervention(s): Monitored during session, Repositioned, Ice applied, RN gave pain meds during session  ? ? ?  Home Living Family/patient expects to be discharged to:: Private residence ?Living Arrangements: Spouse/significant other ?Available Help at Discharge: Family;Available 24  hours/day ?Type of Home: Mobile home ?Home Access: Stairs to enter ?Entrance Stairs-Rails: Right ?Entrance Stairs-Number of Steps: 2 ?  ?Home Layout: One level ?Home Equipment: Animator (2 wheels);Cane - single point ?   ?  ?Prior Function Prior Level of Function : Independent/Modified Independent ?  ?  ?  ?  ?  ?  ?Mobility Comments: IND ?ADLs Comments: ind ?  ? ? ?Hand Dominance  ?   ? ?  ?Extremity/Trunk Assessment  ? Upper Extremity Assessment ?Upper Extremity Assessment: Overall WFL for tasks assessed ?  ? ?Lower Extremity Assessment ?Lower Extremity Assessment: RLE deficits/detail;LLE deficits/detail ?RLE Deficits / Details: MMT ank DF/PF 5/5 ?RLE Sensation: WNL ?LLE Deficits / Details: MMT ank DF/PF 5/5, no extensor lag noted ?LLE Sensation: WNL ?  ? ?Cervical / Trunk Assessment ?Cervical / Trunk Assessment: Kyphotic  ?Communication  ? Communication: No difficulties  ?Cognition Arousal/Alertness: Awake/alert ?Behavior During Therapy: [facility] for tasks assessed/performed ?Overall Cognitive Status: Within Functional Limits for tasks assessed ?  ?  ?  ?  ?  ?  ?  ?  ?  ?  ?  ?  ?  ?  ?  ?  ?  ?  ?  ? ?  ?General Comments   ? ?  ?Exercises Total Joint Exercises ?Ankle Circles/Pumps: AROM, 20 reps, Both ?Other Exercises ?Other Exercises: Incentive spirometry x5  ? ?Assessment/Plan  ?  ?PT Assessment Patient needs continued PT services  ?PT Problem List Decreased strength;Decreased range of motion;Decreased activity tolerance;Decreased balance;Decreased mobility;Decreased coordination;Pain ? ?   ?  ?PT Treatment Interventions DME instruction;Gait training;Stair training;Functional mobility training;Therapeutic activities;Therapeutic exercise;Balance training;Neuromuscular re-education;Patient/family education   ? ?PT Goals (Current goals can be found in the Care Plan section)  ?Acute Rehab PT Goals ?Patient Stated Goal: get back to using treadmill ?PT Goal Formulation: With patient ?Time For Goal  Achievement: 04/20/22 ?Potential to Achieve Goals: Good ? ?  ?Frequency 7X/week ?  ? ? ?Co-evaluation   ?  ?  ?  ?  ? ? ?  ?AM-PAC PT "6 Clicks" Mobility  ?Outcome Measure Help needed turning from your back to your side while in a flat bed without using bedrails?: None ?Help needed moving from lying on your back to sitting on the side of a flat bed without using bedrails?: None ?Help needed moving to and from a bed to a chair (including a wheelchair)?: A Little ?Help needed standing up from a chair using your arms (e.g., wheelchair or bedside chair)?: A Little ?Help needed to walk in hospital room?: A Little ?Help needed climbing 3-5 steps with a railing? : A Little ?6 Click Score: 20 ? ?  ?End of Session Equipment Utilized During Treatment: Gait belt ?Activity Tolerance: Patient tolerated treatment well ?Patient left: in chair;with call bell/phone within reach;with chair alarm set ?Nurse Communication: Mobility status ?PT Visit Diagnosis: Difficulty in walking, not elsewhere classified (R26.2) ?  ? ?Time: 8527-7824 ?PT Time Calculation (min) (ACUTE ONLY): 26 min ? ? ?Charges:   PT Evaluation ?$PT Eval Low Complexity: 1 Low ?PT Treatments ?$Gait Training: 8-22 mins ?  ?   ? ? ?Coolidge Breeze, PT, DPT ?[facility] Department ?Office: [phone] ?Pager: [phone] ? ?Coolidge Breeze ?04/13/2022, 4:44 PM ? ?

## 2022-04-13 NOTE — Op Note (Signed)
DATE OF SURGERY:  04/13/2022 ?TIME: 1:00 PM ? ?PATIENT NAME:  Debbie Steele   ?AGE: 66 y.o.  ? ? ?PRE-OPERATIVE DIAGNOSIS:  End-stage left knee osteoarthritis ? ?POST-OPERATIVE DIAGNOSIS:  Same ? ?PROCEDURE:  Left Total Knee Arthroplasty ? ?SURGEON:  Willaim Sheng, MD  ? ?ASSISTANT:  Izola Price, RNFA, present and scrubbed throughout the case, critical for assistance with exposure, retraction, instrumentation, and closure. ? ? ?OPERATIVE IMPLANTS:  ?Cemented Zimmer persona 7 standard femur, E tibia, 32 mm patella, 11 mm MC poly ?Implant Name Type Inv. Item Serial No. Manufacturer Lot No. LRB No. Used Action  ?CEMENT BONE REFOBACIN [id number] [place] - [id number] Cement CEMENT BONE REFOBACIN [id number] [place]  [employer](ORTH,TRAU,BIO,SG) [id number] Left 2 Implanted  ?TIBIA STEM 5 DEG SZ E L KNEE - [id number] Knees TIBIA STEM 5 DEG SZ E L KNEE  [employer](ORTH,TRAU,BIO,SG) [id number] Left 1 Implanted  ?STEM POLY PAT PLY 58M KNEE - [id number] Knees STEM POLY PAT PLY 58M KNEE  [employer](ORTH,TRAU,BIO,SG) [id number] Left 1 Implanted  ?COMP FEM CEMT PERSONA STD SZ7 - [id number] Knees COMP FEM CEMT PERSONA STD SZ7  [employer](ORTH,TRAU,BIO,SG) [id number] Left 1 Implanted  ?STEM MED AS PERS SZ6-7 11 - [id number] Stem STEM MED AS PERS SZ6-7 11  [employer]ON(ORTH,TRAU,BIO,SG) [id number] Left 1 Implanted  ? ? ?  ?PREOPERATIVE INDICATIONS: ? ?Debbie Steele is a 66 y.o. year old female with end stage bone on bone degenerative arthritis of the knee who failed conservative treatment, including injections, antiinflammatories, activity modification, and assistive devices, and had significant impairment of their activities of daily living, and elected for Total Knee Arthroplasty.  ? ?The risks, benefits, and alternatives were discussed at length including but not limited to the risks of infection, bleeding, nerve injury, stiffness, blood clots, the need for revision surgery, cardiopulmonary complications, among others, and they were willing to  proceed. ? ?ESTIMATED BLOOD LOSS: 25cc ? ?OPERATIVE DESCRIPTION: ? ? Once adequate anesthesia, preoperative antibiotics, 2 gm of ancef,1 gm of Tranexamic Acid administered, the patient was positioned supine with a left thigh tourniquet placed.  The left lower extremity was prepped and draped in sterile fashion.  A time-  out was performed identifying the patient, planned procedure, and the appropriate extremity.  ?   ?The leg was  exsanguinated, tourniquet elevated to 250 mmHg.  A midline incision was  ? made followed by median parapatellar arthrotomy. Anterior horn of the medial meniscus was released and resected. A medial release was performed, the infrapatellar fat pad was resected with care taken to protect the patellar tendon. The suprapatellar fat was removed to exposed the distal anterior femur. The anterior horn of the lateral meniscus and ACL were released.   ? ?Following initial  exposure, attention was first to the femur.  The femoral  ? canal was opened with a drill, canal was suctioned to try to prevent fat emboli.  An  ? intramedullary rod was passed set at 5 degrees valgus, 10 mm. The distal femur was resected.  Following this resection, the tibia was  ? subluxated anteriorly.  Using the extramedullary guide, 10 mm of bone was resected off  ? the proximal lateral tibia.  Following the resection the extension space was still tied with a 10 mm block, elected to cut an additional 2 mm.  We confirmed the gap would be  ? stable medially and laterally with a size 40m spacer block as well as confirmed that the tibial cut was perpendicular in the coronal plane, checking  with an alignment rod.  ? ? Once this was done, the posterior femoral referencing femoral sizer was placed under to the posterior condyles with 3 degrees of external rotational which was parallel to the transepicondylar axis and perpendicular to [employer]. The femur was sized to be a size 7 in the anterior-  posterior dimension. The  ?  anterior, posterior, and  chamfer cuts were made without difficulty nor  ? notching making certain that I was along the anterior cortex to help  ? with flexion gap stability.  ?Next a laminar spreader was placed with the knee in flexion and the medial lateral menisci were resected.  5 cc of the Exparel mixture was injected in the medial side of the back of the knee and 3 cc in the lateral side.  1/2 inch curved osteotome was used to resect posterior osteophyte that was then removed with a pituitary rongeur.  ?    ? At this point, the tibia was sized to be a size E.  The size E tray was  ? then pinned in position. Trial reduction was now carried with a 7 femur, ? E tibia, a 10 mm MC insert.  The knee had full extension and was stable to varus valgus stress in extension.  The knee was slightly tight in flexion and the PCL was partially released.  ? ?Attention was next directed to the patella.  Precut  measurement was noted to be 24 mm.  I resected down to 14 mm and used a  91m patellar button to restore patellar height as well as cover the cut surface.  ? ? ? The patella lug holes were drilled and a 32 mm patella poly trial was placed. ? ?  The knee was brought to full extension with good flexion stability with the patella  ? tracking through the trochlea without application of pressure.   ? ? ?Next the femoral component was again assessed and determined to be seated and appropriately lateralized.  The femoral lug holes were drilled.  The femoral component was then removed.Tibial component was again assessed and felt to be seated and appropriately rotated with the medial third of the tubercle. The tibia was then drilled, and keel punched.   ? ? Final components were  opened and antibiotic cement  (given hx of diabetes and nicotine use) was mixed.   ?   ?Final implants were then  cemented onto cleaned and dried cut surfaces of bone with the knee brought to extension with a 11 mm MC poly.  The knee was irrigated with  sterile Betadine diluted in saline as well as pulse lavage normal saline. The synovial lining was  then injected a dilute Exparel.  ?   ? Once the cement had fully cured, excess cement was removed  ? throughout the knee.  I confirmed that I was satisfied with the range of  ? motion and stability, and the final 11 mm MC poly insert was chosen.  It was  ? placed into the knee.  ?   ?   ? The tourniquet had been let down.  No significant  ? hemostasis was required.  The medial parapatellar arthrotomy was then reapproximated using #1 Stratafix sutures with the knee  in flexion.  The  ? remaining wound was closed with 0 stratafix, 2-0 Vicryl, and running 3-0 Monocryl.  ? The knee was cleaned, dried, dressed sterilely using Dermabond and  ? Aquacel dressing.  The patient was then  brought to  recovery room in stable condition, tolerating the procedure  well. There were no complications. ? ? ?Post op recs: ?WB: WBAT ?Abx: ancef x23 hours post op ?Imaging: PACU xrays ?DVT prophylaxis: Aspirin '81mg'$  BID x4 weeks ?Follow up: 2 weeks after surgery for a wound check with Dr. Zachery Dakins at [facility].  ?Address: [address] [place], [place], [place] [address]  ?Office Phone: [phone] ? ?Charlies Constable, MD ?Orthopaedic Surgery ? ? ? ? ? ? ? ? ? ? ? ? ?  ?

## 2022-04-13 NOTE — Progress Notes (Signed)
AssistedDr. Charlene Green with left, adductor canal, ultrasound guided block. Side rails up, monitors on throughout procedure. See vital signs in flow sheet. Tolerated Procedure well.  

## 2022-04-13 NOTE — Anesthesia Procedure Notes (Signed)
Spinal ? ?Patient location during procedure: OR ?Start time: 04/13/2022 11:20 AM ?Reason for block: surgical anesthesia ?Staffing ?Performed: resident/CRNA  ?Anesthesiologist: Belinda Block, MD ?Resident/CRNA: Gerald Leitz, CRNA ?Preanesthetic Checklist ?Completed: patient identified, IV checked, site marked, risks and benefits discussed, surgical consent, monitors and equipment checked, pre-op evaluation and timeout performed ?Spinal Block ?Patient position: sitting ?Prep: DuraPrep ?Patient monitoring: heart rate, continuous pulse ox, blood pressure and cardiac monitor ?Approach: midline ?Location: L3-4 ?Injection technique: single-shot ?Needle ?Needle type: Introducer and Pencan  ?Needle gauge: 25 G ?Needle length: 9 cm ?Assessment ?Sensory level: T4 ?Events: CSF return ?Additional Notes ?Negative paresthesia. Negative blood return. Positive free-flowing CSF. Expiration date of kit checked and confirmed. Patient tolerated procedure well, without complications. ? ? ? ? ? ?

## 2022-04-13 NOTE — Transfer of Care (Signed)
Immediate Anesthesia Transfer of Care Note ? ?Patient: Debbie Steele ? ?Procedure(s) Performed: Procedure(s): ?TOTAL KNEE ARTHROPLASTY (Left) ? ?Patient Location: PACU ? ?Anesthesia Type:Spinal ? ?Level of Consciousness: awake, alert  and oriented ? ?Airway & Oxygen Therapy: Patient Spontanous Breathing ? ?Post-op Assessment: Report given to RN and Post -op Vital signs reviewed and stable ? ?Post vital signs: Reviewed and stable ? ?Last Vitals:  ?Vitals:  ? 04/13/22 1008 04/13/22 1009  ?BP:    ?Pulse: 71 69  ?Resp: 15 14  ?Temp:    ?SpO2: 95% 95%  ? ? ?Complications: No apparent anesthesia complications ? ?

## 2022-04-13 NOTE — Anesthesia Procedure Notes (Addendum)
Anesthesia Regional Block: Adductor canal block  ? ?Pre-Anesthetic Checklist: , timeout performed,  Correct Patient, Correct Site, Correct Laterality,  Correct Procedure, Correct Position, site marked,  Risks and benefits discussed,  Surgical consent,  Pre-op evaluation,  At surgeon's request and post-op pain management ? ?Laterality: Left ? ?Prep: chloraprep     ?  ?Needles:  ?Injection technique: Single-shot ? ?Needle Type: Echogenic Stimulator Needle   ? ? ? ? ? ? ? ?Additional Needles: ? ? ?Procedures:,,,, ultrasound used (permanent image in chart),,    ?Narrative:  ?Start time: 04/13/2022 9:15 AM ?End time: 04/13/2022 9:35 AM ?Injection made incrementally with aspirations every 5 mL. ? ?Performed by: Personally  ?Anesthesiologist: Belinda Block, MD ? ? ? ? ?

## 2022-04-13 NOTE — Anesthesia Postprocedure Evaluation (Signed)
Anesthesia Post Note ? ?Patient: Debbie Steele ? ?Procedure(s) Performed: TOTAL KNEE ARTHROPLASTY (Left: Knee) ? ?  ? ?Patient location during evaluation: PACU ?Anesthesia Type: Spinal ?Level of consciousness: awake ?Pain management: pain level controlled ?Vital Signs Assessment: post-procedure vital signs reviewed and stable ?Respiratory status: spontaneous breathing ?Cardiovascular status: stable ?Postop Assessment: no apparent nausea or vomiting ?Anesthetic complications: no ? ? ?No notable events documented. ? ?Last Vitals:  ?Vitals:  ? 04/13/22 1500 04/13/22 1512  ?BP: (!) 140/57 (!) 159/67  ?Pulse: 76 80  ?Resp: 12 15  ?Temp: 36.7 ?C 36.6 ?C  ?SpO2: 95% 96%  ?  ?Last Pain:  ?Vitals:  ? 04/13/22 1554  ?TempSrc:   ?PainSc: 5   ? ? ?  ?  ?  ?  ?  ?  ? ?Darion Juhasz ? ? ? ? ?

## 2022-04-13 NOTE — Plan of Care (Signed)

## 2022-04-13 NOTE — Interval H&P Note (Signed)
The patient has been re-examined, and the chart reviewed, and there have been no interval changes to the documented history and physical.   ? ?Plan for left total knee arthroplasty today. ? ?The operative side was examined and the patient was confirmed to have. Sens DPN, SPN, TN intact, Motor EHL, ext, flex 5/5, and DP 2+, PT 2+, No significant edema. ? ? ?The risks, benefits, and alternatives have been discussed at length with patient, and the patient is willing to proceed.  Left knee marked. Consent has been signed. ? ?

## 2022-04-13 NOTE — Discharge Instructions (Signed)
INSTRUCTIONS AFTER JOINT REPLACEMENT  ? ?Remove items at home which could result in a fall. This includes throw rugs or furniture in walking pathways ?ICE to the affected joint every three hours while awake for 30 minutes at a time, for at least the first 3-5 days, and then as needed for pain and swelling.  Continue to use ice for pain and swelling. You may notice swelling that will progress down to the foot and ankle.  This is normal after surgery.  Elevate your leg when you are not up walking on it.   ?Continue to use the breathing machine you got in the hospital (incentive spirometer) which will help keep your temperature down.  It is common for your temperature to cycle up and down following surgery, especially at night when you are not up moving around and exerting yourself.  The breathing machine keeps your lungs expanded and your temperature down. ? ? ?DIET:  As you were doing prior to hospitalization, we recommend a well-balanced diet. ? ?DRESSING / WOUND CARE / SHOWERING ? ?Keep the surgical dressing until follow up.  The dressing is water proof, so you can shower without any extra covering.  IF THE DRESSING FALLS OFF or the wound gets wet inside, change the dressing with sterile gauze.  Please use good hand washing techniques before changing the dressing.  Do not use any lotions or creams on the incision until instructed by your surgeon.   ? ?ACTIVITY ? ?Increase activity slowly as tolerated, but follow the weight bearing instructions below.   ?No driving for 6 weeks or until further direction given by your physician.  You cannot drive while taking narcotics.  ?No lifting or carrying greater than 10 lbs. until further directed by your surgeon. ?Avoid periods of inactivity such as sitting longer than an hour when not asleep. This helps prevent blood clots.  ?You may return to work once you are authorized by your doctor.  ? ? ? ?WEIGHT BEARING  ? ?Weight bearing as tolerated with assist device (walker, cane,  etc) as directed, use it as long as suggested by your surgeon or therapist, typically at least 4-6 weeks. ? ? ?EXERCISES ? ?Results after joint replacement surgery are often greatly improved when you follow the exercise, range of motion and muscle strengthening exercises prescribed by your doctor. Safety measures are also important to protect the joint from further injury. Any time any of these exercises cause you to have increased pain or swelling, decrease what you are doing until you are comfortable again and then slowly increase them. If you have problems or questions, call your caregiver or physical therapist for advice.  ? ?Rehabilitation is important following a joint replacement. After just a few days of immobilization, the muscles of the leg can become weakened and shrink (atrophy).  These exercises are designed to build up the tone and strength of the thigh and leg muscles and to improve motion. Often times heat used for twenty to thirty minutes before working out will loosen up your tissues and help with improving the range of motion but do not use heat for the first two weeks following surgery (sometimes heat can increase post-operative swelling).  ? ?These exercises can be done on a training (exercise) mat, on the floor, on a table or on a bed. Use whatever works the best and is most comfortable for you.    Use music or television while you are exercising so that the exercises are a pleasant break in your   day. This will make your life better with the exercises acting as a break in your routine that you can look forward to.   Perform all exercises about fifteen times, three times per day or as directed.  You should exercise both the operative leg and the other leg as well. ? ?Exercises include: ?  ?Quad Sets - Tighten up the muscle on the front of the thigh (Quad) and hold for 5-10 seconds.   ?Straight Leg Raises - With your knee straight (if you were given a brace, keep it on), lift the leg to 60  degrees, hold for 3 seconds, and slowly lower the leg.  Perform this exercise against resistance later as your leg gets stronger.  ?Leg Slides: Lying on your back, slowly slide your foot toward your buttocks, bending your knee up off the floor (only go as far as is comfortable). Then slowly slide your foot back down until your leg is flat on the floor again.  ?Angel Wings: Lying on your back spread your legs to the side as far apart as you can without causing discomfort.  ?Hamstring Strength:  Lying on your back, push your heel against the floor with your leg straight by tightening up the muscles of your buttocks.  Repeat, but this time bend your knee to a comfortable angle, and push your heel against the floor.  You may put a pillow under the heel to make it more comfortable if necessary.  ? ?A rehabilitation program following joint replacement surgery can speed recovery and prevent re-injury in the future due to weakened muscles. Contact your doctor or a physical therapist for more information on knee rehabilitation.  ? ? ?CONSTIPATION ? ?Constipation is defined medically as fewer than three stools per week and severe constipation as less than one stool per week.  Even if you have a regular bowel pattern at home, your normal regimen is likely to be disrupted due to multiple reasons following surgery.  Combination of anesthesia, postoperative narcotics, change in appetite and fluid intake all can affect your bowels.  ? ?YOU MUST use at least one of the following options; they are listed in order of increasing strength to get the job done.  They are all available over the counter, and you may need to use some, POSSIBLY even all of these options:   ? ?Drink plenty of fluids (prune juice may be helpful) and high fiber foods ?Colace 100 mg by mouth twice a day  ?Senokot for constipation as directed and as needed Dulcolax (bisacodyl), take with full glass of water  ?Miralax (polyethylene glycol) once or twice a day as  needed. ? ?If you have tried all these things and are unable to have a bowel movement in the first 3-4 days after surgery call either your surgeon or your primary doctor.   ? ?If you experience loose stools or diarrhea, hold the medications until you stool forms back up.  If your symptoms do not get better within 1 week or if they get worse, check with your doctor.  If you experience "the worst abdominal pain ever" or develop nausea or vomiting, please contact the office immediately for further recommendations for treatment. ? ? ?ITCHING:  If you experience itching with your medications, try taking only a single pain pill, or even half a pain pill at a time.  You can also use Benadryl over the counter for itching or also to help with sleep.  ? ?TED HOSE STOCKINGS:  Use stockings on both   legs until for at least 2 weeks or as directed by physician office. They may be removed at night for sleeping. ? ?MEDICATIONS:  See your medication summary on the ?After Visit Summary? that nursing will review with you.  You may have some home medications which will be placed on hold until you complete the course of blood thinner medication.  It is important for you to complete the blood thinner medication as prescribed. ? ? ?Blood clot prevention (DVT Prophylaxis): After surgery you are at an increased risk for a blood clot. you were prescribed a blood thinner, Aspirin '81mg'$ , to be taken twice daily for a total of 4 weeks from surgery to help reduce your risk of getting a blood clot. This will help prevent a blood clot. Signs of a pulmonary embolus (blood clot in the lungs) include sudden short of breath, feeling lightheaded or dizzy, chest pain with a deep breath, rapid pulse rapid breathing. Signs of a blood clot in your arms or legs include new unexplained swelling and cramping, warm, red or darkened skin around the painful area. Please call the office or 911 right away if these signs or symptoms develop. ? ?PRECAUTIONS:  If you  experience chest pain or shortness of breath - call 911 immediately for transfer to the hospital emergency department.  ? ?If you develop a fever greater that 101 F, purulent drainage from wound, increased r

## 2022-04-14 ENCOUNTER — Other Ambulatory Visit (HOSPITAL_COMMUNITY): Payer: Self-pay

## 2022-04-14 DIAGNOSIS — M1712 Unilateral primary osteoarthritis, left knee: Secondary | ICD-10-CM | POA: Diagnosis not present

## 2022-04-14 DIAGNOSIS — R7303 Prediabetes: Secondary | ICD-10-CM | POA: Diagnosis not present

## 2022-04-14 DIAGNOSIS — I10 Essential (primary) hypertension: Secondary | ICD-10-CM | POA: Diagnosis not present

## 2022-04-14 DIAGNOSIS — F17290 Nicotine dependence, other tobacco product, uncomplicated: Secondary | ICD-10-CM | POA: Diagnosis not present

## 2022-04-14 DIAGNOSIS — Z85828 Personal history of other malignant neoplasm of skin: Secondary | ICD-10-CM | POA: Diagnosis not present

## 2022-04-14 DIAGNOSIS — Z7984 Long term (current) use of oral hypoglycemic drugs: Secondary | ICD-10-CM | POA: Diagnosis not present

## 2022-04-14 DIAGNOSIS — Z79899 Other long term (current) drug therapy: Secondary | ICD-10-CM | POA: Diagnosis not present

## 2022-04-14 LAB — CBC
HCT: 41.7 % (ref 36.0–46.0)
Hemoglobin: 13.8 g/dL (ref 12.0–15.0)
MCH: 29.4 pg (ref 26.0–34.0)
MCHC: 33.1 g/dL (ref 30.0–36.0)
MCV: 88.7 fL (ref 80.0–100.0)
Platelets: 296 10*3/uL (ref 150–400)
RBC: 4.7 MIL/uL (ref 3.87–5.11)
RDW: 13.1 % (ref 11.5–15.5)
WBC: 9.9 10*3/uL (ref 4.0–10.5)
nRBC: 0 % (ref 0.0–0.2)

## 2022-04-14 LAB — BASIC METABOLIC PANEL
Anion gap: 8 (ref 5–15)
BUN: 18 mg/dL (ref 8–23)
CO2: 27 mmol/L (ref 22–32)
Calcium: 9.4 mg/dL (ref 8.9–10.3)
Chloride: 102 mmol/L (ref 98–111)
Creatinine, Ser: 0.76 mg/dL (ref 0.44–1.00)
GFR, Estimated: >60 mL/min (ref >60)
Glucose, Bld: 190 mg/dL — ABNORMAL HIGH (ref 70–99)
Potassium: 4.1 mmol/L (ref 3.5–5.1)
Sodium: 137 mmol/L (ref 135–145)

## 2022-04-14 MED ORDER — OXYCODONE HCL 5 MG PO TABS
5.0000 mg | ORAL_TABLET | ORAL | 0 refills | Status: AC | PRN
  Filled 2022-04-14: qty 40, 7d supply, fill #0

## 2022-04-14 NOTE — Progress Notes (Signed)
? ? ? ?  Subjective: ? ?Patient reports pain as mild.  Pain well controlled denies distal numbness and tingling.  Worked very well with physical therapy yesterday.  Difficulty sleeping overnight which is common for her.  Eager to go home today. ? ?Objective:  ? ?VITALS:   ?Vitals:  ? 04/13/22 2047 04/14/22 0117 04/14/22 0117 04/14/22 0504  ?BP: 123/65 131/63 131/63 130/65  ?Pulse: 86 66 63 61  ?Resp: '17 18 18 17  '$ ?Temp: 98.4 ?F (36.9 ?C) 97.7 ?F (36.5 ?C) 97.7 ?F (36.5 ?C) (!) 97.5 ?F (36.4 ?C)  ?TempSrc: Oral   Oral  ?SpO2: 94% 92% 92% 94%  ?Weight:      ?Height:      ? ? ?Sensation intact distally ?Intact pulses distally ?Dorsiflexion/Plantar flexion intact ?Incision: dressing C/D/I ?Compartment soft ? ? ?Lab Results  ?Component Value Date  ? WBC 9.9 04/14/2022  ? HGB 13.8 04/14/2022  ? HCT 41.7 04/14/2022  ? MCV 88.7 04/14/2022  ? PLT 296 04/14/2022  ? ?BMET ?   ?Component Value Date/Time  ? NA 137 04/14/2022 0336  ? NA 145 (H) 09/30/2017 0933  ? K 4.1 04/14/2022 0336  ? CL 102 04/14/2022 0336  ? CO2 27 04/14/2022 0336  ? GLUCOSE 190 (H) 04/14/2022 0336  ? BUN 18 04/14/2022 0336  ? BUN 6 09/30/2017 0933  ? CREATININE 0.76 04/14/2022 0336  ? CALCIUM 9.4 04/14/2022 0336  ? GFRNONAA >60 04/14/2022 0336  ? ?Xray: Postop left total knee in good position without adverse features.  No fracture or dislocation. ? ?Assessment/Plan: ?1 Day Post-Op  ? ?Principal Problem: ?  Localized osteoarthritis of left knee ? ?S/p L TKA 5/8 ? ?Post op recs: ?WB: WBAT ?Abx: ancef x23 hours post op ?Imaging: PACU xrays ?DVT prophylaxis: Aspirin '81mg'$  BID x4 weeks ?Follow up: 2 weeks after surgery for a wound check with Dr. Zachery Dakins at [facility].  ?Address: [address] [place], [place],  [address]  ?Office Phone: [phone] ? ? ? ?Marylouise Mallet A Elin Fenley ?04/14/2022, 6:42 AM ? ? ?Charlies Constable, MD ? ?Contact information:   ?Weekdays 7am-5pm epic message Dr. Zachery Dakins, or call office for patient follow up: [phone]  [phone] ?After hours and holidays please check [email or web address] for group call information for [facility] ? ?  ?

## 2022-04-14 NOTE — Plan of Care (Signed)
?  Problem: Education: ?Goal: Knowledge of the prescribed therapeutic regimen will improve ?Outcome: Progressing ?  ?Problem: Activity: ?Goal: Range of joint motion will improve ?Outcome: Progressing ?  ?Problem: Pain Management: ?Goal: Pain level will decrease with appropriate interventions ?Outcome: Progressing ?  ?Problem: Safety: ?Goal: Ability to remain free from injury will improve ?Outcome: Progressing ?  ?Problem: Skin Integrity: ?Goal: Risk for impaired skin integrity will decrease ?Outcome: Progressing ?  ?

## 2022-04-14 NOTE — Progress Notes (Signed)
Physical Therapy Treatment ?Patient Details ?Name: Debbie Steele ?MRN: [id number] ?DOB: 14-Nov-1956 ?Today's Date: 04/14/2022 ? ? ?History of Present Illness pt is a 66yo female presenting s/p L-TKA on 04/13/22. PMH: Anxiety & depression, bipolar disorder, GERD, HLD, HTN, ? ?  ?PT Comments  ? ? POD # 1 am session ?Pt AxO x 3 very pleasant and eager to go home.  Assisted OOB to amb in hallway went well.  General bed mobility comments: demonstrated and instructed how to use belt to assist LE General transfer comment: only one VC on proper hand placement and safety with turns.  Also assisted with a toilet transfer.  Pt did well. General Gait Details: pt tolerated amb a functional distance of 55 feet with walker at Supervision level.  Slow and steady. General stair comments: pt stated she has "one step" in the back to enter home.  Practiced twice with 50% VC's on proper walker placement and sequencing.  Pt tolerated well. Then returned to room to perform some TE's following HEP handout.  Instructed on proper tech, freq as well as use of ICE.  Addressed all mobility questions, discussed appropriate activity, educated on use of ICE.  Pt ready for D/C to home. ? ?  ?Recommendations for follow up therapy are one component of a multi-disciplinary discharge planning process, led by the attending physician.  Recommendations may be updated based on patient status, additional functional criteria and insurance authorization. ? ?Follow Up Recommendations ? Follow physician's recommendations for discharge plan and follow up therapies ?  ?  ?Assistance Recommended at Discharge Set up Supervision/Assistance  ?Patient can return home with the following A little help with walking and/or transfers;A little help with bathing/dressing/bathroom;Assistance with cooking/housework;Assist for transportation;Help with stairs or ramp for entrance ?  ?Equipment Recommendations ? None recommended by PT  ?  ?Recommendations for Other Services   ? ? ?   ?Precautions / Restrictions Precautions ?Precautions: None ?Precaution Comments: instructed no pillow under knee ?Restrictions ?Weight Bearing Restrictions: No ?LLE Weight Bearing: Weight bearing as tolerated  ?  ? ?Mobility ? Bed Mobility ?Overal bed mobility: Needs Assistance ?Bed Mobility: Supine to Sit ?  ?  ?Supine to sit: Supervision ?  ?  ?General bed mobility comments: demonstrated and instructed how to use belt to assist LE ?  ? ?Transfers ?Overall transfer level: Needs assistance ?Equipment used: Rolling walker (2 wheels) ?Transfers: Sit to/from Stand, Bed to chair/wheelchair/BSC ?Sit to Stand: Supervision ?Stand pivot transfers: Supervision ?  ?  ?  ?  ?General transfer comment: only one VC on proper hand placement and safety with turns.  Also assisted with a toilet transfer.  Pt did well. ?  ? ?Ambulation/Gait ?Ambulation/Gait assistance: Supervision, Min guard ?Gait Distance (Feet): 55 Feet ?Assistive device: Rolling walker (2 wheels) ?  ?Gait velocity: decreased ?  ?  ?General Gait Details: pt tolerated amb a functional distance of 55 feet with walker at Supervision level.  Slow and steady. ? ? ?Stairs ?Stairs: Yes ?Stairs assistance: Supervision ?Stair Management: No rails, Step to pattern, Forwards ?Number of Stairs: 1 ?General stair comments: pt stated she has "one step" in the back to enter home.  Practiced twice with 50% VC's on proper walker placement and sequencing.  Pt tolerated well. ? ? ?Wheelchair Mobility ?  ? ?Modified Rankin (Stroke Patients Only) ?  ? ? ?  ?Balance   ?  ?  ?  ?  ?  ?  ?  ?  ?  ?  ?  ?  ?  ?  ?  ?  ?  ?  ?  ? ?  ?  Cognition Arousal/Alertness: Awake/alert ?Behavior During Therapy: [facility] for tasks assessed/performed ?Overall Cognitive Status: Within Functional Limits for tasks assessed ?  ?  ?  ?  ?  ?  ?  ?  ?  ?  ?  ?  ?  ?  ?  ?  ?General Comments: AxO x 3 very pleasant and motivated. ?  ?  ? ?  ?Exercises  /Total Knee Replacement TE's following HEP handout ?10 reps B LE  ankle pumps ?05 reps towel squeezes ?05 reps knee presses ?05 reps heel slides  ?05 reps SAQ's ?05 reps SLR's ?05 reps ABD ?Educated on use of gait belt to assist with TE's ?Followed by ICE ? ? ?  ?General Comments   ?  ?  ? ?Pertinent Vitals/Pain Pain Assessment ?Pain Assessment: 0-10 ?Pain Score: 5  ?Pain Location: L knee ?Pain Descriptors / Indicators: Operative site guarding, Tender, Tightness ?Pain Intervention(s): Monitored during session, Premedicated before session, Repositioned, Patient requesting pain meds-RN notified, Ice applied  ? ? ?Home Living   ?  ?  ?  ?  ?  ?  ?  ?  ?  ?   ?  ?Prior Function    ?  ?  ?   ? ?PT Goals (current goals can now be found in the care plan section) Progress towards PT goals: Progressing toward goals ? ?  ?Frequency ? ? ? 7X/week ? ? ? ?  ?PT Plan Current plan remains appropriate  ? ? ?Co-evaluation   ?  ?  ?  ?  ? ?  ?AM-PAC PT "6 Clicks" Mobility   ?Outcome Measure ? Help needed turning from your back to your side while in a flat bed without using bedrails?: None ?Help needed moving from lying on your back to sitting on the side of a flat bed without using bedrails?: None ?Help needed moving to and from a bed to a chair (including a wheelchair)?: None ?Help needed standing up from a chair using your arms (e.g., wheelchair or bedside chair)?: None ?Help needed to walk in hospital room?: None ?Help needed climbing 3-5 steps with a railing? : A Little ?6 Click Score: 23 ? ?  ?End of Session Equipment Utilized During Treatment: Gait belt ?Activity Tolerance: Patient tolerated treatment well ?Patient left: in chair;with call bell/phone within reach;with chair alarm set ?Nurse Communication: Mobility status (pt ready for D/C to home) ?PT Visit Diagnosis: Difficulty in walking, not elsewhere classified (R26.2) ?  ? ? ?Time: 2774-1287 ?PT Time Calculation (min) (ACUTE ONLY): 34 min ? ?Charges:  $Gait Training: 8-22 mins ?$Therapeutic Exercise: 8-22 mins ?$Therapeutic Activity:  8-22 mins          ?          ? ?{Jayron Maqueda  PTA ?Acute  Rehabilitation Services ?Pager      [phone] ?Office      [phone] ? ?

## 2022-04-14 NOTE — Plan of Care (Signed)
Pt ready to DC home with husband 

## 2022-04-14 NOTE — TOC Transition Note (Signed)
Transition of Care (TOC) - CM/SW Discharge Note ? ?Patient Details  ?Name: Debbie Steele ?MRN: [id number] ?Date of Birth: 02-Feb-1956 ? ?Transition of Care (TOC) CM/SW Contact:  ?Sherie Don, LCSW ?Phone Number: ?04/14/2022, 9:41 AM ? ?Clinical Narrative: Patient is expected to discharge home after working with PT. CSW met with patient and her husband to confirm discharge plan. Patient will go home with OPPT at [facility]. Patient has a rolling walker and cane at home, so there are no DME needs at this time. TOC signing off. ? ?Final next level of care: OP Rehab ?Barriers to Discharge: No Barriers Identified ? ?Patient Goals and CMS Choice ?Patient states their goals for this hospitalization and ongoing recovery are:: Discharge home with OPPT at [facility] ?Choice offered to / list presented to : NA ? ?Discharge Plan and Services          ?DME Arranged: N/A ?DME Agency: NA ? ?Readmission Risk Interventions ?   ? View : No data to display.  ?  ?  ?  ? ?

## 2022-04-15 ENCOUNTER — Encounter: Payer: Self-pay | Admitting: Physical Therapy

## 2022-04-15 ENCOUNTER — Ambulatory Visit: Payer: Medicare Other | Attending: Orthopedic Surgery | Admitting: Physical Therapy

## 2022-04-15 DIAGNOSIS — G8929 Other chronic pain: Secondary | ICD-10-CM | POA: Insufficient documentation

## 2022-04-15 DIAGNOSIS — M25562 Pain in left knee: Secondary | ICD-10-CM | POA: Insufficient documentation

## 2022-04-15 DIAGNOSIS — M25662 Stiffness of left knee, not elsewhere classified: Secondary | ICD-10-CM | POA: Diagnosis not present

## 2022-04-15 DIAGNOSIS — M6281 Muscle weakness (generalized): Secondary | ICD-10-CM | POA: Insufficient documentation

## 2022-04-15 DIAGNOSIS — R600 Localized edema: Secondary | ICD-10-CM | POA: Insufficient documentation

## 2022-04-15 NOTE — Therapy (Signed)
[place] ?[facility] ?[place] [place] ?[place], [place], [address] ?Phone: [phone]   Fax:  [phone] ? ?Physical Therapy Evaluation ? ?Patient Details  ?Name: Debbie Steele ?MRN: [id number] ?Date of Birth: May 18, 1956 ?Referring Provider (PT): Charlies Constable MD ? ? ?Encounter Date: 04/15/2022 ? ? PT End of Session - 04/15/22 1241   ? ? Visit Number 1   ? Number of Visits 18   ? Date for PT Re-Evaluation 05/27/22   ? Authorization Type FOTO AT LEAST EVERY 5TH VISIT.  PROGRESS NOTE AT 10TH VISIT.  KX MODIFIER AFTER 15 VISITS.   ? PT Start Time 1032   ? PT Stop Time 1119   ? PT Time Calculation (min) 47 min   ? Activity Tolerance Patient tolerated treatment well   ? Behavior During Therapy [facility] for tasks assessed/performed   ? ?  ?  ? ?  ? ? ?Past Medical History:  ?Diagnosis Date  ? Anxiety   ? Arthritis   ? Bipolar disorder ([place])   ? Cancer of skin of back   ? Depression   ? GERD (gastroesophageal reflux disease)   ? Hyperlipidemia   ? Hypertension   ? Pre-diabetes   ? ? ?Past Surgical History:  ?Procedure Laterality Date  ? CARPAL TUNNEL RELEASE Right   ? CERVICAL ABLATION    ? CHOLECYSTECTOMY    ? MULTIPLE TOOTH EXTRACTIONS    ? SKIN CANCER EXCISION    ? On back  ? Surgery arm Right   ? TRIGGER FINGER RELEASE    ? TUBAL LIGATION    ? ? ?There were no vitals filed for this visit. ? ? ? Subjective Assessment - 04/15/22 1246   ? ? Subjective The patient presents to the clinic today s/p left total knee replacement performed on 04/13/22.  She is walking safely with a FWW and is compliant to using her TED hose.  She states her nerve block has worn off and she rates her pain at a 10/10.  She is being complinat to her HEP. Rest and elevation decreases pain and being up and walking increases her pain.   ? Pertinent History BI-Polar, HTN, CTR.   ? How long can you walk comfortably? Around home with FWW.   ? Patient Stated Goals Get out of pain and do more.   ? Currently in Pain? Yes   ?  Pain Score 10-Worst pain ever   ? Pain Location Knee   ? Pain Orientation Left   ? Pain Descriptors / Indicators Aching;Throbbing   ? Pain Type Surgical pain   ? Pain Onset In the past 7 days   ? Pain Frequency Constant   ? Aggravating Factors  See above.   ? Pain Relieving Factors See above.   ? ?  ?  ? ?  ? ? ? ? ? [facility] PT Assessment - 04/15/22 0001   ? ?  ? Assessment  ? Medical Diagnosis S/p left total knee replacement.   ? Referring Provider (PT) Charlies Constable MD   ? Onset Date/Surgical Date 04/13/22   ?  ? Precautions  ? Precaution Comments No ultrasound.   ?  ? Restrictions  ? Weight Bearing Restrictions No   ?  ? Balance Screen  ? Has the patient fallen in the past 6 months No   ? Has the patient had a decrease in activity level because of a fear of falling?  Yes   ? Is the patient reluctant to leave their  home because of a fear of falling?  No   ?  ? Home Environment  ? Living Environment Private residence   ?  ? Prior Function  ? Level of Independence Independent   ?  ? Observation/Other Assessments  ? Observations Aquacel intact over patient's left knee.   ? Focus on Therapeutic Outcomes (FOTO)  Complete.   ?  ? Observation/Other Assessments-Edema   ? Edema Circumferential   ?  ? Circumferential Edema  ? Circumferential - Left  LT 5 cms > RT.   ?  ? ROM / Strength  ? AROM / PROM / Strength AROM;Strength   ?  ? AROM  ? Overall AROM Comments -10 degrees and active flexion to 85 degrees.   ?  ? Strength  ? Overall Strength Comments Left hip flexion/abduction is 3-/5, left knee extension is 3-/5.   ?  ? Palpation  ? Palpation comment C/o diffuse left knee pain currently.   ?  ? Ambulation/Gait  ? Gait Comments Safe ambuation with a FWW.   ? ?  ?  ? ?  ? ? ? ? ? ? ? ? ? ? ? ? ? ?Objective measurements completed on examination: See above findings.  ? ? ? ? ? [place] Adult PT Treatment/Exercise - 04/15/22 0001   ? ?  ? Exercises  ? Exercises Knee/Hip   ?  ? Knee/Hip Exercises: Aerobic  ? Nustep Level 1 x 10  minutes moving seat forward x 1 to increase knee flexion.   ?  ? Modalities  ? Modalities Vasopneumatic   ?  ? Vasopneumatic  ? Number Minutes Vasopneumatic  10 minutes   ? Vasopnuematic Location  --   Left knee.  ? Vasopneumatic Pressure Low   ? ?  ?  ? ?  ? ? ? ? ? ? ? ? ? ? ? ? PT Short Term Goals - 04/15/22 1303   ? ?  ? PT SHORT TERM GOAL #1  ? Title Independent with a HEP.   ? Time 2   ? Period Weeks   ? Status New   ? ?  ?  ? ?  ? ? ? ? PT Long Term Goals - 04/15/22 1303   ? ?  ? PT LONG TERM GOAL #1  ? Title Active left knee flexion to 115 degrees+ so the patient can perform functional tasks and do so with pain not > 2-3/10.   ? Time 6   ? Period Weeks   ? Status New   ?  ? PT LONG TERM GOAL #2  ? Title Increase left hip and knee strength to a solid 4+/5 to provide good stability for accomplishment of functional activities.   ? Time 6   ? Period Weeks   ? Status New   ?  ? PT LONG TERM GOAL #3  ? Title Perform a reciprocating stair gait with one railing with pain not > 2-3/10.   ? Time 6   ? Period Weeks   ? Status New   ?  ? PT LONG TERM GOAL #4  ? Title Perform ADL's with pain not >   ? ?  ?  ? ?  ? ? ? ? ? ? ? ? ? Plan - 04/15/22 1256   ? ? Clinical Impression Statement The patient presents to OPPT s/p left total knee replacement performed on 04/13/22.  She is doing well and ambulating safely with a FWW.  She has an expected loss  of active flexion and extension currently.  She has a moderate amount of edema.  We discussed the importancae of being compliant to her HEP.  She is wearing TED hose.  Her FOTO limitation score is  77%.  Patient will benefit from skilled physical therapy intervention to address pain and deficits   ? Personal Factors and Comorbidities Comorbidity 1;Other   ? Comorbidities BI-Polar, HTN, CTR.   ? Examination-Activity Limitations Other;Bathing;Locomotion Level;Stand   ? Examination-Participation Restrictions Other;Meal Prep   ? Stability/Clinical Decision Making Stable/Uncomplicated    ? Clinical Decision Making Low   ? Rehab Potential Excellent   ? PT Frequency --   2-3 times a week for 4 to 6 weeks.  ? PT Treatment/Interventions ADLs/Self Care Home Management;Cryotherapy;Electrical Stimulation;Moist Heat;Stair training;Gait training;Functional mobility training;Therapeutic activities;Therapeutic exercise;Neuromuscular re-education;Manual techniques;Patient/family education;Passive range of motion;Vasopneumatic Device   ? PT Next Visit Plan Progress into TKA protocol.  Vasopneumatic.   ? Consulted and Agree with Plan of Care Patient   ? ?  ?  ? ?  ? ? ?Patient will benefit from skilled therapeutic intervention in order to improve the following deficits and impairments:  Pain, Abnormal gait, Decreased activity tolerance, Decreased strength, Decreased range of motion, Increased edema ? ?Visit Diagnosis: ?Chronic pain of left knee - Plan: PT plan of care cert/re-cert ? ?Stiffness of left knee, not elsewhere classified - Plan: PT plan of care cert/re-cert ? ?Localized edema - Plan: PT plan of care cert/re-cert ? ?Muscle weakness (generalized) - Plan: PT plan of care cert/re-cert ? ? ? ? ?Problem List ?Patient Active Problem List  ? Diagnosis Date Noted  ? Localized osteoarthritis of left knee 04/13/2022  ? Toxic encephalopathy 12/26/2019  ? Rhabdomyolysis 12/26/2019  ? Hypokalemia 12/26/2019  ? Sinus pause 12/26/2019  ? Intentional drug overdose ([place])   ? Altered behavior   ? Acute respiratory failure ([place]) 12/15/2019  ? Prediabetes 09/30/2017  ? Parkinsonism ([place]) 02/23/2014  ? Morbid obesity ([place]) 08/21/2013  ? Anxiety state, unspecified 03/08/2013  ? Bipolar disorder, unspecified ([place]) 03/08/2013  ? Essential hypertension, benign 03/08/2013  ? Hyperlipidemia LDL goal <130 03/08/2013  ? ? ?Eiliyah Reh, [place], PT ?04/15/2022, 1:09 PM ? ?[place] ?[facility] ?[address] ?[place], [place], [address] ?Phone: [phone]   Fax:  [phone] ? ?Name: JONI COLEGROVE ?MRN: [id number] ?Date of Birth: Dec 31, 1955 ? ? ?

## 2022-04-15 NOTE — Discharge Summary (Signed)
Physician Discharge Summary  ?Patient ID: ?Debbie Steele ?MRN: [id number] ?DOB/AGE: Apr 11, 1956 66 y.o. ? ?Admit date: 04/13/2022 ?Discharge date: 04/15/2022 ? ?Admission Diagnoses:  ?Localized osteoarthritis of left knee ? ?Discharge Diagnoses:  ?Principal Problem: ?  Localized osteoarthritis of left knee ? ? ?Past Medical History:  ?Diagnosis Date  ? Anxiety   ? Arthritis   ? Bipolar disorder ([place])   ? Cancer of skin of back   ? Depression   ? GERD (gastroesophageal reflux disease)   ? Hyperlipidemia   ? Hypertension   ? Pre-diabetes   ? ? ?Surgeries: Procedure(s): ?TOTAL KNEE ARTHROPLASTY on 04/13/2022 ?  ?Consultants (if any):  ? ?Discharged Condition: Improved ? ?Hospital Course: Debbie Steele is an 66 y.o. female who was admitted 04/13/2022 with a diagnosis of Localized osteoarthritis of left knee and went to the operating room on 04/13/2022 and underwent the above named procedures.   ? ?She was given perioperative antibiotics:  ?Anti-infectives (From admission, onward)  ? ? Start     Dose/Rate Route Frequency Ordered Stop  ? 04/13/22 1700  ceFAZolin (ANCEF) IVPB 2g/100 mL premix       ? 2 g ?200 mL/hr over 30 Minutes Intravenous Every 6 hours 04/13/22 1342 04/13/22 2330  ? 04/13/22 0800  ceFAZolin (ANCEF) IVPB 2g/100 mL premix       ? 2 g ?200 mL/hr over 30 Minutes Intravenous On call to O.R. 04/13/22 0757 04/13/22 1134  ? ?  ?. ? ?She was given sequential compression devices, early ambulation, and aspirin for DVT prophylaxis. ? ?She benefited maximally from the hospital stay and there were no complications.   ? ?Recent vital signs:  ?Vitals:  ? 04/14/22 0504 04/14/22 1007  ?BP: 130/65 125/84  ?Pulse: 61 63  ?Resp: 17 20  ?Temp: (!) 97.5 ?F (36.4 ?C) 97.9 ?F (36.6 ?C)  ?SpO2: 94% 95%  ? ? ?Recent laboratory studies:  ?Lab Results  ?Component Value Date  ? HGB 13.8 04/14/2022  ? HGB 14.5 03/31/2022  ? HGB 12.0 12/22/2019  ? ?Lab Results  ?Component Value Date  ? WBC 9.9 04/14/2022  ? PLT 296 04/14/2022  ? ?Lab  Results  ?Component Value Date  ? INR 0.9 12/15/2019  ? ?Lab Results  ?Component Value Date  ? NA 137 04/14/2022  ? K 4.1 04/14/2022  ? CL 102 04/14/2022  ? CO2 27 04/14/2022  ? BUN 18 04/14/2022  ? CREATININE 0.76 04/14/2022  ? GLUCOSE 190 (H) 04/14/2022  ? ? ?Discharge Medications:   ?Allergies as of 04/14/2022   ? ?   Reactions  ? Nuedexta [dextromethorphan-quinidine] Hives  ? Depakene [valproic Acid] Itching, Rash  ? ?  ? ?  ?Medication List  ?  ? ?STOP taking these medications   ? ?traMADol 50 MG tablet ?Commonly known as: ULTRAM ?  ? ?  ? ?TAKE these medications   ? ?acetaminophen 500 MG tablet ?Commonly known as: TYLENOL ?Take 2 tablets (1,000 mg total) by mouth every 8 (eight) hours as needed. ?  ?ALPRAZolam XR 1 MG 24 hr tablet ?Generic drug: ALPRAZolam ?Take 1 mg by mouth every morning. ?  ?amLODipine 5 MG tablet ?Commonly known as: NORVASC ?Take 2 tablets (10 mg total) by mouth daily. ?What changed:  ?when to take this ?reasons to take this ?  ?aspirin EC 81 MG tablet ?Take 1 tablet (81 mg total) by mouth 2 (two) times daily for 28 days. Swallow whole. ?  ?atenolol-chlorthalidone 50-25 MG tablet ?Commonly known as: TENORETIC ?  Take 1 tablet by mouth daily. ?  ?atorvastatin 40 MG tablet ?Commonly known as: LIPITOR ?Take 40 mg by mouth daily. ?  ?bisacodyl 10 MG suppository ?Commonly known as: DULCOLAX ?Place 1 suppository (10 mg total) rectally daily as needed for moderate constipation. ?  ?Chlorhexidine Gluconate Cloth 2 % Pads ?Apply 6 each topically daily. ?  ?chlorthalidone 25 MG tablet ?Commonly known as: HYGROTON ?Take 1 tablet (25 mg total) by mouth daily. ?  ?[employer] FIBER GUMMIES PO ?Take 2 tablets by mouth daily. ?  ?diphenhydramine-acetaminophen 25-500 MG Tabs tablet ?Commonly known as: TYLENOL PM ?Take 1 tablet by mouth at bedtime. ?  ?docusate 50 MG/5ML liquid ?Commonly known as: COLACE ?Place 10 mLs (100 mg total) into feeding tube 2 (two) times daily as needed for mild constipation. ?  ?gabapentin  400 MG capsule ?Commonly known as: NEURONTIN ?Take 400-800 mg by mouth See admin instructions. Take 400 mg by mouth in the morning and 800 mg at bedtime ?  ?glucose blood test strip ?Check BS QD and PRN ?  ?hydrALAZINE 25 MG tablet ?Commonly known as: APRESOLINE ?Take 2 tablets (50 mg total) by mouth 2 (two) times daily. ?  ?lamoTRIgine 200 MG tablet ?Commonly known as: LAMICTAL ?Take 200 mg by mouth daily. ?  ?MELATONIN PO ?Take 20 mg by mouth daily. ?  ?methocarbamol 500 MG tablet ?Commonly known as: ROBAXIN ?Take 1 tablet (500 mg total) by mouth every 8 (eight) hours as needed for up to 10 days for muscle spasms. ?  ?ondansetron 4 MG tablet ?Commonly known as: Zofran ?Take 1 tablet (4 mg total) by mouth every 8 (eight) hours as needed for up to 14 days for nausea or vomiting. ?  ?oxyCODONE 5 MG immediate release tablet ?Commonly known as: Roxicodone ?Take 1 tablet by mouth every 4 hours as needed for up to 7 days for moderate to severe pain. ?  ?polyethylene glycol 17 g packet ?Commonly known as: MIRALAX / GLYCOLAX ?Take 17 g by mouth daily as needed. ?  ?simvastatin 40 MG tablet ?Commonly known as: ZOCOR ?TAKE 1 TABLET (40 MG TOTAL) BY MOUTH AT BEDTIME. ?  ? ?  ? ? ?Diagnostic Studies: DG Knee Left Port ? ?Result Date: 04/13/2022 ?CLINICAL DATA:  Status post left knee replacement. EXAM: PORTABLE LEFT KNEE - 1-2 VIEW COMPARISON:  Left knee radiographs 08/21/2013 FINDINGS: Interval total left knee arthroplasty. No perihardware lucency is seen to indicate hardware failure or loosening. Expected postoperative changes including small joint effusion and intra-articular air. Mild-to-moderate soft tissue swelling. No acute fracture or dislocation. IMPRESSION: Status post total left knee arthroplasty without evidence of hardware failure. Electronically Signed   By: Debbie Steele M.D.   On: 04/13/2022 14:15   ? ?Disposition: Discharge disposition: 01-Home or Self Care ? ? ? ? ? ? ?Discharge Instructions   ? ? Call MD / Call  [phone]   Complete by: As directed ?  ? If you experience chest pain or shortness of breath, CALL 911 and be transported to the hospital emergency room.  If you develope a fever above 101 F, pus (white drainage) or increased drainage or redness at the wound, or calf pain, call your surgeon's office.  ? Constipation Prevention   Complete by: As directed ?  ? Drink plenty of fluids.  Prune juice may be helpful.  You may use a stool softener, such as Colace (over the counter) 100 mg twice a day.  Use MiraLax (over the counter) for constipation as needed.  ?  Diet - low sodium heart healthy   Complete by: As directed ?  ? Do not put a pillow under the knee. Place it under the heel.   Complete by: As directed ?  ? Increase activity slowly as tolerated   Complete by: As directed ?  ? Post-operative opioid taper instructions:   Complete by: As directed ?  ? POST-OPERATIVE OPIOID TAPER INSTRUCTIONS: ?It is important to wean off of your opioid medication as soon as possible. If you do not need pain medication after your surgery it is ok to stop day one. ?Opioids include: ?Codeine, Hydrocodone(Norco, Vicodin), Oxycodone(Percocet, oxycontin) and hydromorphone amongst others.  ?Long term and even short term use of opiods can cause: ?Increased pain response ?Dependence ?Constipation ?Depression ?Respiratory depression ?And more.  ?Withdrawal symptoms can include ?Flu like symptoms ?Nausea, vomiting ?And more ?Techniques to manage these symptoms ?Hydrate well ?Eat regular healthy meals ?Stay active ?Use relaxation techniques(deep breathing, meditating, yoga) ?Do Not substitute Alcohol to help with tapering ?If you have been on opioids for less than two weeks and do not have pain than it is ok to stop all together.  ?Plan to wean off of opioids ?This plan should start within one week post op of your joint replacement. ?Maintain the same interval or time between taking each dose and first decrease the dose.  ?Cut the total daily intake  of opioids by one tablet each day ?Next start to increase the time between doses. ?The last dose that should be eliminated is the evening dose.  ? ?  ? ?  ? ? ? Follow-up Information   ? ? Donata Duff

## 2022-04-16 ENCOUNTER — Ambulatory Visit: Payer: Medicare Other | Admitting: *Deleted

## 2022-04-16 DIAGNOSIS — R600 Localized edema: Secondary | ICD-10-CM | POA: Diagnosis not present

## 2022-04-16 DIAGNOSIS — M25562 Pain in left knee: Secondary | ICD-10-CM | POA: Diagnosis not present

## 2022-04-16 DIAGNOSIS — M6281 Muscle weakness (generalized): Secondary | ICD-10-CM | POA: Diagnosis not present

## 2022-04-16 DIAGNOSIS — G8929 Other chronic pain: Secondary | ICD-10-CM

## 2022-04-16 DIAGNOSIS — M25662 Stiffness of left knee, not elsewhere classified: Secondary | ICD-10-CM | POA: Diagnosis not present

## 2022-04-16 NOTE — Therapy (Signed)
[place] ?[facility] ?[place] ?[place], [place], [address] ?Phone: [phone]   Fax:  [phone] ? ?Physical Therapy Treatment ? ?Patient Details  ?Name: Debbie Steele ?MRN: [id number] ?Date of Birth: 1956-07-12 ?Referring Provider (PT): Charlies Constable MD ? ? ?Encounter Date: 04/16/2022 ? ? PT End of Session - 04/16/22 1133   ? ? Visit Number 2   ? Number of Visits 18   ? Authorization Type FOTO AT LEAST EVERY 5TH VISIT.  PROGRESS NOTE AT 10TH VISIT.  KX MODIFIER AFTER 15 VISITS.   ? PT Start Time 1118   ? PT Stop Time 1207   ? PT Time Calculation (min) 49 min   ? ?  ?  ? ?  ? ? ?Past Medical History:  ?Diagnosis Date  ? Anxiety   ? Arthritis   ? Bipolar disorder ([place])   ? Cancer of skin of back   ? Depression   ? GERD (gastroesophageal reflux disease)   ? Hyperlipidemia   ? Hypertension   ? Pre-diabetes   ? ? ?Past Surgical History:  ?Procedure Laterality Date  ? CARPAL TUNNEL RELEASE Right   ? CERVICAL ABLATION    ? CHOLECYSTECTOMY    ? MULTIPLE TOOTH EXTRACTIONS    ? SKIN CANCER EXCISION    ? On back  ? Surgery arm Right   ? TOTAL KNEE ARTHROPLASTY Left 04/13/2022  ? Procedure: TOTAL KNEE ARTHROPLASTY;  Surgeon: Willaim Sheng, MD;  Location: [facility];  Service: Orthopedics;  Laterality: Left;  ? TRIGGER FINGER RELEASE    ? TUBAL LIGATION    ? ? ?There were no vitals filed for this visit. ? ? Subjective Assessment - 04/16/22 1036   ? ? Subjective LT knee 8/10. LT hip pain 4/10 when walking   ? Pertinent History BI-Polar, HTN, CTR.   ? How long can you walk comfortably? Around home with FWW.   ? Currently in Pain? Yes   ? Pain Score 8    ? Pain Location Knee   ? Pain Orientation Left   ? Pain Descriptors / Indicators Aching;Throbbing   ? Pain Type Surgical pain   ? ?  ?  ? ?  ? ? ? ? ? ? ? ? ? ? ? ? ? ? ? ? ? ? ? ? [place] Adult PT Treatment/Exercise - 04/16/22 0001   ? ?  ? Ambulation/Gait  ? Ambulation/Gait Yes   ? Ambulation/Gait Assistance 7: Independent   ?  Assistive device Rolling walker   ? Gait Comments Practiced step-through patter and heel-toe pattern   ?  ? Exercises  ? Exercises Knee/Hip   ?  ? Knee/Hip Exercises: Aerobic  ? Nustep Level 3 x 15 minutes moving seat forward to increase knee flexion. seat 12-10   ?  ? Knee/Hip Exercises: Seated  ? Long Arc [employer] sets;10 reps   ?  ? Knee/Hip Exercises: Supine  ? Quad Sets Right;2 sets;10 reps   ?  ? Modalities  ? Modalities Vasopneumatic   ?  ? Vasopneumatic  ? Number Minutes Vasopneumatic  10 minutes   ? Vasopnuematic Location  --   Left knee.  ? Vasopneumatic Pressure Low   ? Vasopneumatic Temperature  34 for edema   ?  ? Manual Therapy  ? Manual Therapy Soft tissue mobilization   ? Soft tissue mobilization gentle STW to quads and HSs, gentle kneee oscillations   ? ?  ?  ? ?  ? ? ? ? ? ? ? ? ? ? ? ?  PT Short Term Goals - 04/15/22 1303   ? ?  ? PT SHORT TERM GOAL #1  ? Title Independent with a HEP.   ? Time 2   ? Period Weeks   ? Status New   ? ?  ?  ? ?  ? ? ? ? PT Long Term Goals - 04/15/22 1303   ? ?  ? PT LONG TERM GOAL #1  ? Title Active left knee flexion to 115 degrees+ so the patient can perform functional tasks and do so with pain not > 2-3/10.   ? Time 6   ? Period Weeks   ? Status New   ?  ? PT LONG TERM GOAL #2  ? Title Increase left hip and knee strength to a solid 4+/5 to provide good stability for accomplishment of functional activities.   ? Time 6   ? Period Weeks   ? Status New   ?  ? PT LONG TERM GOAL #3  ? Title Perform a reciprocating stair gait with one railing with pain not > 2-3/10.   ? Time 6   ? Period Weeks   ? Status New   ?  ? PT LONG TERM GOAL #4  ? Title Perform ADL's with pain not >   ? ?  ?  ? ?  ? ? ? ? ? ? ? ? Plan - 04/16/22 1045   ? ? Clinical Impression Statement Pt arrived today using RW for gait. Heel-toe gait  pattern practiced and performed well in clinic.Rx focused on ROM progression and quad activation. Vaso end of session for edema control   ? Personal Factors and  Comorbidities Comorbidity 1;Other   ? Comorbidities BI-Polar, HTN, CTR.   ? Examination-Activity Limitations Other;Bathing;Locomotion Level;Stand   ? Examination-Participation Restrictions Other;Meal Prep   ? Rehab Potential Excellent   ? PT Treatment/Interventions ADLs/Self Care Home Management;Cryotherapy;Electrical Stimulation;Moist Heat;Stair training;Gait training;Functional mobility training;Therapeutic activities;Therapeutic exercise;Neuromuscular re-education;Manual techniques;Patient/family education;Passive range of motion;Vasopneumatic Device   ? PT Next Visit Plan Progress into TKA protocol.  Vasopneumatic.   ? Consulted and Agree with Plan of Care Patient   ? ?  ?  ? ?  ? ? ?Patient will benefit from skilled therapeutic intervention in order to improve the following deficits and impairments:  Pain, Abnormal gait, Decreased activity tolerance, Decreased strength, Decreased range of motion, Increased edema ? ?Visit Diagnosis: ?Chronic pain of left knee ? ?Stiffness of left knee, not elsewhere classified ? ?Localized edema ? ?Muscle weakness (generalized) ? ? ? ? ?Problem List ?Patient Active Problem List  ? Diagnosis Date Noted  ? Localized osteoarthritis of left knee 04/13/2022  ? Toxic encephalopathy 12/26/2019  ? Rhabdomyolysis 12/26/2019  ? Hypokalemia 12/26/2019  ? Sinus pause 12/26/2019  ? Intentional drug overdose ([place])   ? Altered behavior   ? Acute respiratory failure ([place]) 12/15/2019  ? Prediabetes 09/30/2017  ? Parkinsonism ([place]) 02/23/2014  ? Morbid obesity ([place]) 08/21/2013  ? Anxiety state, unspecified 03/08/2013  ? Bipolar disorder, unspecified ([place]) 03/08/2013  ? Essential hypertension, benign 03/08/2013  ? Hyperlipidemia LDL goal <130 03/08/2013  ? ? ?Chayden Garrelts,CHRIS, PTA ?04/16/2022, 5:26 PM ? ?[place] ?[facility] ?[place] ?[place], [place], [address] ?Phone: [phone]   Fax:  [phone] ? ?Name: Debbie Steele ?MRN: [id number] ?Date of Birth:  [birth year] ? ? ? ?

## 2022-04-20 ENCOUNTER — Ambulatory Visit: Payer: Medicare Other | Admitting: Physical Therapy

## 2022-04-20 ENCOUNTER — Encounter: Payer: Self-pay | Admitting: Physical Therapy

## 2022-04-20 DIAGNOSIS — M6281 Muscle weakness (generalized): Secondary | ICD-10-CM

## 2022-04-20 DIAGNOSIS — M25662 Stiffness of left knee, not elsewhere classified: Secondary | ICD-10-CM | POA: Diagnosis not present

## 2022-04-20 DIAGNOSIS — G8929 Other chronic pain: Secondary | ICD-10-CM

## 2022-04-20 DIAGNOSIS — R600 Localized edema: Secondary | ICD-10-CM

## 2022-04-20 DIAGNOSIS — M25562 Pain in left knee: Secondary | ICD-10-CM | POA: Diagnosis not present

## 2022-04-20 NOTE — Therapy (Signed)
[place] [facility] ?[address] ?[place], [place], [address] ?Phone: [phone]   Fax:  [phone] ? ?Physical Therapy Treatment ? ?Patient Details  ?Name: Debbie Steele ?MRN: [id number] ?Date of Birth: [birth year] ?Referring Provider (PT): Charlies Constable MD ? ? ?Encounter Date: 04/20/2022 ? ? PT End of Session - 04/20/22 1037   ? ? Visit Number 3   ? Number of Visits 18   ? Date for PT Re-Evaluation 05/27/22   ? Authorization Type FOTO AT LEAST EVERY 5TH VISIT.  PROGRESS NOTE AT 10TH VISIT.  KX MODIFIER AFTER 15 VISITS.   ? PT Start Time 1034   ? PT Stop Time 1115   ? PT Time Calculation (min) 41 min   ? Equipment Utilized During Treatment Other (comment)   FWW  ? Activity Tolerance Patient tolerated treatment well   ? Behavior During Therapy [facility] for tasks assessed/performed   ? ?  ?  ? ?  ? ? ?Past Medical History:  ?Diagnosis Date  ? Anxiety   ? Arthritis   ? Bipolar disorder ([place])   ? Cancer of skin of back   ? Depression   ? GERD (gastroesophageal reflux disease)   ? Hyperlipidemia   ? Hypertension   ? Pre-diabetes   ? ? ?Past Surgical History:  ?Procedure Laterality Date  ? CARPAL TUNNEL RELEASE Right   ? CERVICAL ABLATION    ? CHOLECYSTECTOMY    ? MULTIPLE TOOTH EXTRACTIONS    ? SKIN CANCER EXCISION    ? On back  ? Surgery arm Right   ? TOTAL KNEE ARTHROPLASTY Left 04/13/2022  ? Procedure: TOTAL KNEE ARTHROPLASTY;  Surgeon: Willaim Sheng, MD;  Location: [facility];  Service: Orthopedics;  Laterality: Left;  ? TRIGGER FINGER RELEASE    ? TUBAL LIGATION    ? ? ?There were no vitals filed for this visit. ? ? Subjective Assessment - 04/20/22 1036   ? ? Subjective Reports that she has been having a lot more pain over the weekend.   ? Pertinent History BI-Polar, HTN, CTR.   ? How long can you walk comfortably? Around home with FWW.   ? Patient Stated Goals Get out of pain and do more.   ? Currently in Pain? Yes   ? Pain Score 6    ? Pain Location Knee   ? Pain Orientation  Left   ? Pain Descriptors / Indicators Aching;Throbbing   ? Pain Type Surgical pain   ? Pain Onset 1 to 4 weeks ago   ? Pain Frequency Constant   ? ?  ?  ? ?  ? ? ? ? ? [facility] PT Assessment - 04/20/22 0001   ? ?  ? Assessment  ? Medical Diagnosis S/p left total knee replacement.   ? Referring Provider (PT) Charlies Constable MD   ? Onset Date/Surgical Date 04/13/22   ? Next MD Visit 04/30/2022   ?  ? Precautions  ? Precaution Comments No ultrasound.   ?  ? Restrictions  ? Weight Bearing Restrictions No   ? ?  ?  ? ?  ? ? ? ? ? ? ? ? ? ? ? ? ? ? ? ? [place] Adult PT Treatment/Exercise - 04/20/22 0001   ? ?  ? Knee/Hip Exercises: Aerobic  ? Nustep L2, seat 10-8 x15 min   ?  ? Knee/Hip Exercises: Standing  ? Hip Flexion AROM;Left;20 reps;Knee bent   ? Forward Lunges Left;20 reps;3 seconds   ?  Hip Abduction AROM;Left;20 reps;Knee straight   ? Rocker Board 2 minutes   ?  ? Knee/Hip Exercises: Supine  ? Short Arc [employer] AROM;Left;20 reps   ?  ? Modalities  ? Modalities Vasopneumatic   ?  ? Vasopneumatic  ? Number Minutes Vasopneumatic  10 minutes   ? Vasopnuematic Location  Knee   ? Vasopneumatic Pressure Low   ? Vasopneumatic Temperature  34 for edema   ? ?  ?  ? ?  ? ? ? ? ? ? ? ? ? ? ? ? PT Short Term Goals - 04/15/22 1303   ? ?  ? PT SHORT TERM GOAL #1  ? Title Independent with a HEP.   ? Time 2   ? Period Weeks   ? Status New   ? ?  ?  ? ?  ? ? ? ? PT Long Term Goals - 04/15/22 1303   ? ?  ? PT LONG TERM GOAL #1  ? Title Active left knee flexion to 115 degrees+ so the patient can perform functional tasks and do so with pain not > 2-3/10.   ? Time 6   ? Period Weeks   ? Status New   ?  ? PT LONG TERM GOAL #2  ? Title Increase left hip and knee strength to a solid 4+/5 to provide good stability for accomplishment of functional activities.   ? Time 6   ? Period Weeks   ? Status New   ?  ? PT LONG TERM GOAL #3  ? Title Perform a reciprocating stair gait with one railing with pain not > 2-3/10.   ? Time 6   ? Period Weeks   ?  Status New   ?  ? PT LONG TERM GOAL #4  ? Title Perform ADL's with pain not >   ? ?  ?  ? ?  ? ? ? ? ? ? ? ? Plan - 04/20/22 1151   ? ? Clinical Impression Statement Patient presented in clinic with her husband reporting that she has been more uncomfortable over the weekend and more pain. Patient guided through light therex for ROM and light strengthening. Patient indicating hip and knee discomfort over the weekend and took half a pain pill prior to PT. Patient instructed to use FWW until strength increased to be more functional on cane. Normal vasopnuematic response noted following removal of the modality.   ? Personal Factors and Comorbidities Comorbidity 1;Other   ? Comorbidities BI-Polar, HTN, CTR.   ? Examination-Activity Limitations Other;Bathing;Locomotion Level;Stand   ? Examination-Participation Restrictions Other;Meal Prep   ? Stability/Clinical Decision Making Stable/Uncomplicated   ? Rehab Potential Excellent   ? PT Treatment/Interventions ADLs/Self Care Home Management;Cryotherapy;Electrical Stimulation;Moist Heat;Stair training;Gait training;Functional mobility training;Therapeutic activities;Therapeutic exercise;Neuromuscular re-education;Manual techniques;Patient/family education;Passive range of motion;Vasopneumatic Device   ? PT Next Visit Plan Progress into TKA protocol.  Vasopneumatic.   ? Consulted and Agree with Plan of Care Patient   ? ?  ?  ? ?  ? ? ?Patient will benefit from skilled therapeutic intervention in order to improve the following deficits and impairments:  Pain, Abnormal gait, Decreased activity tolerance, Decreased strength, Decreased range of motion, Increased edema ? ?Visit Diagnosis: ?Chronic pain of left knee ? ?Stiffness of left knee, not elsewhere classified ? ?Localized edema ? ?Muscle weakness (generalized) ? ? ? ? ?Problem List ?Patient Active Problem List  ? Diagnosis Date Noted  ? Localized osteoarthritis of left knee 04/13/2022  ? Toxic encephalopathy  12/26/2019  ?  Rhabdomyolysis 12/26/2019  ? Hypokalemia 12/26/2019  ? Sinus pause 12/26/2019  ? Intentional drug overdose ([place])   ? Altered behavior   ? Acute respiratory failure ([place]) 12/15/2019  ? Prediabetes 09/30/2017  ? Parkinsonism ([place]) 02/23/2014  ? Morbid obesity ([place]) 08/21/2013  ? Anxiety state, unspecified 03/08/2013  ? Bipolar disorder, unspecified ([place]) 03/08/2013  ? Essential hypertension, benign 03/08/2013  ? Hyperlipidemia LDL goal <130 03/08/2013  ? ? ?Standley Brooking, PTA ?04/20/2022, 11:57 AM ? ?[place] ?[facility] ?[address] ?[place], [place], [address] ?Phone: [phone]   Fax:  [phone] ? ?Name: Debbie Steele ?MRN: [id number] ?Date of Birth: 23-Jul-1956 ? ? ? ?

## 2022-04-22 ENCOUNTER — Encounter: Payer: Self-pay | Admitting: Physical Therapy

## 2022-04-22 ENCOUNTER — Ambulatory Visit: Payer: Medicare Other | Admitting: Physical Therapy

## 2022-04-22 DIAGNOSIS — M25662 Stiffness of left knee, not elsewhere classified: Secondary | ICD-10-CM

## 2022-04-22 DIAGNOSIS — R600 Localized edema: Secondary | ICD-10-CM

## 2022-04-22 DIAGNOSIS — M6281 Muscle weakness (generalized): Secondary | ICD-10-CM

## 2022-04-22 DIAGNOSIS — M25562 Pain in left knee: Secondary | ICD-10-CM | POA: Diagnosis not present

## 2022-04-22 DIAGNOSIS — G8929 Other chronic pain: Secondary | ICD-10-CM | POA: Diagnosis not present

## 2022-04-22 NOTE — Therapy (Signed)
[place] ?[facility] ?[address] ?[place], [place], [address] ?Phone: [phone]   Fax:  [phone] ? ?Physical Therapy Treatment ? ?Patient Details  ?Name: Debbie Steele ?MRN: [id number] ?Date of Birth: 22-Oct-1956 ?Referring Provider (PT): Charlies Constable MD ? ? ?Encounter Date: 04/22/2022 ? ? PT End of Session - 04/22/22 1350   ? ? Visit Number 4   ? Number of Visits 18   ? Date for PT Re-Evaluation 05/27/22   ? Authorization Type FOTO AT LEAST EVERY 5TH VISIT.  PROGRESS NOTE AT 10TH VISIT.  KX MODIFIER AFTER 15 VISITS.   ? PT Start Time 1346   ? PT Stop Time 7062   ? PT Time Calculation (min) 43 min   ? Equipment Utilized During Treatment Other (comment)   FWW  ? Activity Tolerance Patient tolerated treatment well   ? Behavior During Therapy [facility] for tasks assessed/performed   ? ?  ?  ? ?  ? ? ?Past Medical History:  ?Diagnosis Date  ? Anxiety   ? Arthritis   ? Bipolar disorder ([place])   ? Cancer of skin of back   ? Depression   ? GERD (gastroesophageal reflux disease)   ? Hyperlipidemia   ? Hypertension   ? Pre-diabetes   ? ? ?Past Surgical History:  ?Procedure Laterality Date  ? CARPAL TUNNEL RELEASE Right   ? CERVICAL ABLATION    ? CHOLECYSTECTOMY    ? MULTIPLE TOOTH EXTRACTIONS    ? SKIN CANCER EXCISION    ? On back  ? Surgery arm Right   ? TOTAL KNEE ARTHROPLASTY Left 04/13/2022  ? Procedure: TOTAL KNEE ARTHROPLASTY;  Surgeon: Willaim Sheng, MD;  Location: [facility];  Service: Orthopedics;  Laterality: Left;  ? TRIGGER FINGER RELEASE    ? TUBAL LIGATION    ? ? ?There were no vitals filed for this visit. ? ? Subjective Assessment - 04/22/22 1349   ? ? Subjective Still having more pain.   ? Pertinent History BI-Polar, HTN, CTR.   ? How long can you walk comfortably? Around home with FWW.   ? Patient Stated Goals Get out of pain and do more.   ? Currently in Pain? Yes   ? Pain Score 5    ? Pain Location Knee   ? Pain Orientation Left   ? Pain Descriptors / Indicators  Discomfort   ? Pain Type Surgical pain   ? Pain Onset 1 to 4 weeks ago   ? Pain Frequency Constant   ? ?  ?  ? ?  ? ? ? ? ? [facility] PT Assessment - 04/22/22 0001   ? ?  ? Assessment  ? Medical Diagnosis S/p left total knee replacement.   ? Referring Provider (PT) Charlies Constable MD   ? Onset Date/Surgical Date 04/13/22   ? Next MD Visit 04/30/2022   ?  ? Precautions  ? Precaution Comments No ultrasound.   ?  ? Restrictions  ? Weight Bearing Restrictions No   ? ?  ?  ? ?  ? ? ? ? ? ? ? ? ? ? ? ? ? ? ? ? [place] Adult PT Treatment/Exercise - 04/22/22 0001   ? ?  ? Knee/Hip Exercises: Aerobic  ? Nustep L2, seat 10-8 x15 min   ?  ? Knee/Hip Exercises: Standing  ? Forward Lunges Left;20 reps;3 seconds   ? Forward Step Up Left;20 reps;Hand Hold: 2;Step Height: 4"   ? Rocker Board 3 minutes   ?  ?  Knee/Hip Exercises: Supine  ? Short Arc [employer] AROM;Left;20 reps   ?  ? Modalities  ? Modalities Vasopneumatic   ?  ? Vasopneumatic  ? Number Minutes Vasopneumatic  10 minutes   ? Vasopnuematic Location  Knee   ? Vasopneumatic Pressure Low   ? Vasopneumatic Temperature  34 for edema   ? ?  ?  ? ?  ? ? ? ? ? ? ? ? ? ? ? ? PT Short Term Goals - 04/15/22 1303   ? ?  ? PT SHORT TERM GOAL #1  ? Title Independent with a HEP.   ? Time 2   ? Period Weeks   ? Status New   ? ?  ?  ? ?  ? ? ? ? PT Long Term Goals - 04/15/22 1303   ? ?  ? PT LONG TERM GOAL #1  ? Title Active left knee flexion to 115 degrees+ so the patient can perform functional tasks and do so with pain not > 2-3/10.   ? Time 6   ? Period Weeks   ? Status New   ?  ? PT LONG TERM GOAL #2  ? Title Increase left hip and knee strength to a solid 4+/5 to provide good stability for accomplishment of functional activities.   ? Time 6   ? Period Weeks   ? Status New   ?  ? PT LONG TERM GOAL #3  ? Title Perform a reciprocating stair gait with one railing with pain not > 2-3/10.   ? Time 6   ? Period Weeks   ? Status New   ?  ? PT LONG TERM GOAL #4  ? Title Perform ADL's with pain not  >   ? ?  ?  ? ?  ? ? ? ? ? ? ? ? Plan - 04/22/22 1424   ? ? Clinical Impression Statement Patient presented in clinic with reports of still increased pain. Patient able to tolerate all therex and progressed to light step ups as she does have two steps to get into her home from the front porch. Patient able to tolerate 4" steps without as much UE support. Good quad activation and stabiltiy with resisted SAQ. Increased edema throughout LLE and into L ankle. Compression stockings donned throughout therex. Normal vasopneumatic response noted following removal of the modality.   ? Personal Factors and Comorbidities Comorbidity 1;Other   ? Comorbidities BI-Polar, HTN, CTR.   ? Examination-Activity Limitations Other;Bathing;Locomotion Level;Stand   ? Examination-Participation Restrictions Other;Meal Prep   ? Stability/Clinical Decision Making Stable/Uncomplicated   ? Rehab Potential Excellent   ? PT Treatment/Interventions ADLs/Self Care Home Management;Cryotherapy;Electrical Stimulation;Moist Heat;Stair training;Gait training;Functional mobility training;Therapeutic activities;Therapeutic exercise;Neuromuscular re-education;Manual techniques;Patient/family education;Passive range of motion;Vasopneumatic Device   ? PT Next Visit Plan Progress into TKA protocol.  Vasopneumatic.   ? Consulted and Agree with Plan of Care Patient   ? ?  ?  ? ?  ? ? ?Patient will benefit from skilled therapeutic intervention in order to improve the following deficits and impairments:  Pain, Abnormal gait, Decreased activity tolerance, Decreased strength, Decreased range of motion, Increased edema ? ?Visit Diagnosis: ?Chronic pain of left knee ? ?Stiffness of left knee, not elsewhere classified ? ?Localized edema ? ?Muscle weakness (generalized) ? ? ? ? ?Problem List ?Patient Active Problem List  ? Diagnosis Date Noted  ? Localized osteoarthritis of left knee 04/13/2022  ? Toxic encephalopathy 12/26/2019  ? Rhabdomyolysis 12/26/2019  ?  Hypokalemia 12/26/2019  ?  Sinus pause 12/26/2019  ? Intentional drug overdose ([place])   ? Altered behavior   ? Acute respiratory failure ([place]) 12/15/2019  ? Prediabetes 09/30/2017  ? Parkinsonism ([place]) 02/23/2014  ? Morbid obesity ([place]) 08/21/2013  ? Anxiety state, unspecified 03/08/2013  ? Bipolar disorder, unspecified ([place]) 03/08/2013  ? Essential hypertension, benign 03/08/2013  ? Hyperlipidemia LDL goal <130 03/08/2013  ? ? ?Standley Brooking, PTA ?04/22/2022, 2:45 PM ? ?[place] ?[facility] ?[address] ?[place], [place], [address] ?Phone: [phone]   Fax:  [phone] ? ?Name: Debbie Steele ?MRN: [id number] ?Date of Birth: [birth year] ? ? ? ?

## 2022-04-23 ENCOUNTER — Other Ambulatory Visit (HOSPITAL_COMMUNITY): Payer: Self-pay

## 2022-04-23 MED ORDER — OXYCODONE HCL 5 MG PO TABS
ORAL_TABLET | ORAL | 0 refills | Status: DC
  Filled 2022-04-23: qty 30, 7d supply, fill #0

## 2022-04-24 ENCOUNTER — Ambulatory Visit: Payer: Medicare Other

## 2022-04-24 DIAGNOSIS — R600 Localized edema: Secondary | ICD-10-CM | POA: Diagnosis not present

## 2022-04-24 DIAGNOSIS — M6281 Muscle weakness (generalized): Secondary | ICD-10-CM

## 2022-04-24 DIAGNOSIS — M25662 Stiffness of left knee, not elsewhere classified: Secondary | ICD-10-CM

## 2022-04-24 DIAGNOSIS — M25562 Pain in left knee: Secondary | ICD-10-CM | POA: Diagnosis not present

## 2022-04-24 DIAGNOSIS — G8929 Other chronic pain: Secondary | ICD-10-CM

## 2022-04-24 NOTE — Therapy (Signed)
[facility] [place], [place], [address] Phone: [phone]   Fax:  [phone]  Physical Therapy Treatment  Patient Details  Name: Debbie Steele MRN: [id number] Date of Birth: Jun 27, 1956 Referring Provider (PT): Charlies Constable MD   Encounter Date: 04/24/2022   PT End of Session - 04/24/22 1031     Visit Number 5    Number of Visits 18    Date for PT Re-Evaluation 05/27/22    Authorization Type FOTO AT LEAST EVERY 5TH VISIT.  PROGRESS NOTE AT 10TH VISIT.  KX MODIFIER AFTER 15 VISITS.    PT Start Time 1026    PT Stop Time 1126    PT Time Calculation (min) 60 min    Equipment Utilized During Treatment Other (comment)   FWW   Activity Tolerance Patient tolerated treatment well    Behavior During Therapy [facility] for tasks assessed/performed             Past Medical History:  Diagnosis Date   Anxiety    Arthritis    Bipolar disorder ([employer])    Cancer of skin of back    Depression    GERD (gastroesophageal reflux disease)    Hyperlipidemia    Hypertension    Pre-diabetes     Past Surgical History:  Procedure Laterality Date   CARPAL TUNNEL RELEASE Right    CERVICAL ABLATION     CHOLECYSTECTOMY     MULTIPLE TOOTH EXTRACTIONS     SKIN CANCER EXCISION     On back   Surgery arm Right    TOTAL KNEE ARTHROPLASTY Left 04/13/2022   Procedure: TOTAL KNEE ARTHROPLASTY;  Surgeon: Willaim Sheng, MD;  Location: [facility];  Service: Orthopedics;  Laterality: Left;   TRIGGER FINGER RELEASE     TUBAL LIGATION      There were no vitals filed for this visit.   Subjective Assessment - 04/24/22 1030     Subjective Patient reports that her thigh is the main thing that is bothering her this morning.    Pertinent History BI-Polar, HTN, CTR.    How long can you walk comfortably? Around home with FWW.    Patient Stated Goals Get out of pain and do more.    Currently in Pain? Yes    Pain Score 6     Pain Location Knee     Pain Orientation Left    Pain Onset 1 to 4 weeks ago                               [facility] Adult PT Treatment/Exercise - 04/24/22 0001       Knee/Hip Exercises: Stretches   Passive Hamstring Stretch Left;4 reps;30 seconds      Knee/Hip Exercises: Aerobic   Nustep L3 x 15 minutes; seat 11-8      Knee/Hip Exercises: Standing   Forward Lunges Left;3 seconds   onto 6" step; 2 minutes   Rocker Board 5 minutes    Other Standing Knee Exercises Mini squat   30 reps; BUE support     Knee/Hip Exercises: Seated   Long Arc Quad Left;20 reps      Modalities   Modalities Vasopneumatic      Vasopneumatic   Number Minutes Vasopneumatic  10 minutes    Vasopnuematic Location  Knee    Vasopneumatic Pressure Low    Vasopneumatic Temperature  34 for edema      Manual  Therapy   Manual Therapy Soft tissue mobilization;Passive ROM    Soft tissue mobilization to left quadriceps    Passive ROM flexion to tolerance                       PT Short Term Goals - 04/15/22 1303       PT SHORT TERM GOAL #1   Title Independent with a HEP.    Time 2    Period Weeks    Status New               PT Long Term Goals - 04/15/22 1303       PT LONG TERM GOAL #1   Title Active left knee flexion to 115 degrees+ so the patient can perform functional tasks and do so with pain not > 2-3/10.    Time 6    Period Weeks    Status New      PT LONG TERM GOAL #2   Title Increase left hip and knee strength to a solid 4+/5 to provide good stability for accomplishment of functional activities.    Time 6    Period Weeks    Status New      PT LONG TERM GOAL #3   Title Perform a reciprocating stair gait with one railing with pain not > 2-3/10.    Time 6    Period Weeks    Status New      PT LONG TERM GOAL #4   Title Perform ADL's with pain not >                   Plan - 04/24/22 1053     Clinical Impression Statement Patient was introduced to a standing  hamstring stretch, mini squats, and long arc quads for improved soft tissue extensibility and muscular strength. She required minimal cueing with the standing hamstring stretch for proper positioning to facilitate improved soft tissue extensibility. Manual therapy focused on improved knee flexion through the use of PROM and soft tissue mobilization to the quadriceps and IT band. She reported that her knee felt a little sore upon the conclusion of treatment. She continues to require skilled physical therapy to address her remaining impairments to return to her prior level of function.    Personal Factors and Comorbidities Comorbidity 1;Other    Comorbidities BI-Polar, HTN, CTR.    Examination-Activity Limitations Other;Bathing;Locomotion Level;Stand    Examination-Participation Restrictions Other;Meal Prep    Stability/Clinical Decision Making Stable/Uncomplicated    Rehab Potential Excellent    PT Treatment/Interventions ADLs/Self Care Home Management;Cryotherapy;Electrical Stimulation;Moist Heat;Stair training;Gait training;Functional mobility training;Therapeutic activities;Therapeutic exercise;Neuromuscular re-education;Manual techniques;Patient/family education;Passive range of motion;Vasopneumatic Device    PT Next Visit Plan Progress into TKA protocol.  Vasopneumatic.    Consulted and Agree with Plan of Care Patient             Patient will benefit from skilled therapeutic intervention in order to improve the following deficits and impairments:  Pain, Abnormal gait, Decreased activity tolerance, Decreased strength, Decreased range of motion, Increased edema  Visit Diagnosis: Chronic pain of left knee  Stiffness of left knee, not elsewhere classified  Localized edema  Muscle weakness (generalized)     Problem List Patient Active Problem List   Diagnosis Date Noted   Localized osteoarthritis of left knee 04/13/2022   Toxic encephalopathy 12/26/2019   Rhabdomyolysis 12/26/2019    Hypokalemia 12/26/2019   Sinus pause 12/26/2019   Intentional drug overdose ([place])  Altered behavior    Acute respiratory failure ([employer]) 12/15/2019   Prediabetes 09/30/2017   Parkinsonism ([place]) 02/23/2014   Morbid obesity ([place]) 08/21/2013   Anxiety state, unspecified 03/08/2013   Bipolar disorder, unspecified ([place]) 03/08/2013   Essential hypertension, benign 03/08/2013   Hyperlipidemia LDL goal <130 03/08/2013    Darlin Coco, PT 04/24/2022, 11:31 AM  [facility] [address] [place], [place], [address] Phone: [phone]   Fax:  [phone]  Name: Debbie Steele MRN: [id number] Date of Birth: 1956-03-10

## 2022-04-27 ENCOUNTER — Ambulatory Visit: Payer: Medicare Other | Admitting: Physical Therapy

## 2022-04-27 ENCOUNTER — Encounter: Payer: Self-pay | Admitting: Physical Therapy

## 2022-04-27 DIAGNOSIS — M25662 Stiffness of left knee, not elsewhere classified: Secondary | ICD-10-CM | POA: Diagnosis not present

## 2022-04-27 DIAGNOSIS — G8929 Other chronic pain: Secondary | ICD-10-CM | POA: Diagnosis not present

## 2022-04-27 DIAGNOSIS — R600 Localized edema: Secondary | ICD-10-CM

## 2022-04-27 DIAGNOSIS — M25562 Pain in left knee: Secondary | ICD-10-CM | POA: Diagnosis not present

## 2022-04-27 DIAGNOSIS — M6281 Muscle weakness (generalized): Secondary | ICD-10-CM

## 2022-04-27 NOTE — Therapy (Signed)
[facility] [place], [place], [address] Phone: [phone]   Fax:  [phone]  Physical Therapy Treatment  Patient Details  Name: Debbie Steele MRN: [id number] Date of Birth: [birth year] Referring Provider (PT): Charlies Constable MD   Encounter Date: 04/27/2022   PT End of Session - 04/27/22 1202     Visit Number 6    Number of Visits 18    Date for PT Re-Evaluation 05/27/22    Authorization Type FOTO AT LEAST EVERY 5TH VISIT.  PROGRESS NOTE AT 10TH VISIT.  KX MODIFIER AFTER 15 VISITS.    PT Start Time 1119    PT Stop Time 1201    PT Time Calculation (min) 42 min    Activity Tolerance Patient tolerated treatment well    Behavior During Therapy WFL for tasks assessed/performed             Past Medical History:  Diagnosis Date   Anxiety    Arthritis    Bipolar disorder ([place])    Cancer of skin of back    Depression    GERD (gastroesophageal reflux disease)    Hyperlipidemia    Hypertension    Pre-diabetes     Past Surgical History:  Procedure Laterality Date   CARPAL TUNNEL RELEASE Right    CERVICAL ABLATION     CHOLECYSTECTOMY     MULTIPLE TOOTH EXTRACTIONS     SKIN CANCER EXCISION     On back   Surgery arm Right    TOTAL KNEE ARTHROPLASTY Left 04/13/2022   Procedure: TOTAL KNEE ARTHROPLASTY;  Surgeon: Willaim Sheng, MD;  Location: [facility];  Service: Orthopedics;  Laterality: Left;   TRIGGER FINGER RELEASE     TUBAL LIGATION      There were no vitals filed for this visit.   Subjective Assessment - 04/27/22 1127     Subjective Reports that she has been having less pain and more comfortable. Patient reports that she did not take any pain meds yesterday.    Pertinent History BI-Polar, HTN, CTR.    How long can you walk comfortably? Around home with FWW.    Patient Stated Goals Get out of pain and do more.    Currently in Pain? Yes    Pain Score 3     Pain Location Knee    Pain Orientation  Left    Pain Descriptors / Indicators Sore    Pain Type Surgical pain    Pain Onset 1 to 4 weeks ago    Pain Frequency Constant                [facility] PT Assessment - 04/27/22 0001       Assessment   Medical Diagnosis S/p left total knee replacement.    Referring Provider (PT) Charlies Constable MD    Onset Date/Surgical Date 04/13/22    Next MD Visit 04/30/2022      Precautions   Precaution Comments No ultrasound.                           [place] Adult PT Treatment/Exercise - 04/27/22 0001       Knee/Hip Exercises: Aerobic   Nustep L3 x 15 minutes; seat 10-8      Knee/Hip Exercises: Standing   Heel Raises Both;20 reps    Heel Raises Limitations B toe raise x20 reps    Forward Lunges Left;20 reps    Forward Step Up Left;20  reps;Hand Hold: 2;Step Height: 6"    Step Down Left;15 reps;Hand Hold: 2;Step Height: 4"      Knee/Hip Exercises: Seated   Long Arc Quad Strengthening;Left;20 reps;Weights    Long Arc Quad Weight 4 lbs.      Modalities   Modalities Vasopneumatic      Vasopneumatic   Number Minutes Vasopneumatic  10 minutes    Vasopnuematic Location  Knee    Vasopneumatic Pressure Low    Vasopneumatic Temperature  34 for edema                       PT Short Term Goals - 04/15/22 1303       PT SHORT TERM GOAL #1   Title Independent with a HEP.    Time 2    Period Weeks    Status New               PT Long Term Goals - 04/15/22 1303       PT LONG TERM GOAL #1   Title Active left knee flexion to 115 degrees+ so the patient can perform functional tasks and do so with pain not > 2-3/10.    Time 6    Period Weeks    Status New      PT LONG TERM GOAL #2   Title Increase left hip and knee strength to a solid 4+/5 to provide good stability for accomplishment of functional activities.    Time 6    Period Weeks    Status New      PT LONG TERM GOAL #3   Title Perform a reciprocating stair gait with one railing with  pain not > 2-3/10.    Time 6    Period Weeks    Status New      PT LONG TERM GOAL #4   Title Perform ADL's with pain not >                   Plan - 04/27/22 1203     Clinical Impression Statement Patient presented in clinic with reports of feeling more comfortable with less pain. Patient progressed with all exercises and patient able to tolerate all therex well. Patient able to tolerate progressed step ups and quad activation techniques. Patient still using SPC for ambulation. Normal vasopnematic response noted following removal of the modality.    Personal Factors and Comorbidities Comorbidity 1;Other    Comorbidities BI-Polar, HTN, CTR.    Examination-Activity Limitations Other;Bathing;Locomotion Level;Stand    Examination-Participation Restrictions Other;Meal Prep    Stability/Clinical Decision Making Stable/Uncomplicated    Rehab Potential Excellent    PT Treatment/Interventions ADLs/Self Care Home Management;Cryotherapy;Electrical Stimulation;Moist Heat;Stair training;Gait training;Functional mobility training;Therapeutic activities;Therapeutic exercise;Neuromuscular re-education;Manual techniques;Patient/family education;Passive range of motion;Vasopneumatic Device    PT Next Visit Plan Progress into TKA protocol.  Vasopneumatic.    Consulted and Agree with Plan of Care Patient             Patient will benefit from skilled therapeutic intervention in order to improve the following deficits and impairments:  Pain, Abnormal gait, Decreased activity tolerance, Decreased strength, Decreased range of motion, Increased edema  Visit Diagnosis: Chronic pain of left knee  Stiffness of left knee, not elsewhere classified  Localized edema  Muscle weakness (generalized)     Problem List Patient Active Problem List   Diagnosis Date Noted   Localized osteoarthritis of left knee 04/13/2022   Toxic encephalopathy 12/26/2019   Rhabdomyolysis 12/26/2019  Hypokalemia  12/26/2019   Sinus pause 12/26/2019   Intentional drug overdose ([place])    Altered behavior    Acute respiratory failure ([place]) 12/15/2019   Prediabetes 09/30/2017   Parkinsonism ([place]) 02/23/2014   Morbid obesity ([place]) 08/21/2013   Anxiety state, unspecified 03/08/2013   Bipolar disorder, unspecified ([place]) 03/08/2013   Essential hypertension, benign 03/08/2013   Hyperlipidemia LDL goal <130 03/08/2013    Standley Brooking, PTA 04/27/2022, 12:11 PM  [facility] [address] [place], [place], [address] Phone: [phone]   Fax:  [phone]  Name: HARRIETTA INCORVAIA MRN: [id number] Date of Birth: 08/11/1956

## 2022-04-29 ENCOUNTER — Encounter: Payer: Self-pay | Admitting: Physical Therapy

## 2022-04-29 ENCOUNTER — Ambulatory Visit: Payer: Medicare Other | Admitting: Physical Therapy

## 2022-04-29 DIAGNOSIS — M25662 Stiffness of left knee, not elsewhere classified: Secondary | ICD-10-CM | POA: Diagnosis not present

## 2022-04-29 DIAGNOSIS — G8929 Other chronic pain: Secondary | ICD-10-CM | POA: Diagnosis not present

## 2022-04-29 DIAGNOSIS — M6281 Muscle weakness (generalized): Secondary | ICD-10-CM

## 2022-04-29 DIAGNOSIS — R600 Localized edema: Secondary | ICD-10-CM | POA: Diagnosis not present

## 2022-04-29 DIAGNOSIS — M25562 Pain in left knee: Secondary | ICD-10-CM | POA: Diagnosis not present

## 2022-04-29 NOTE — Therapy (Signed)
[facility] [place], [place], [address] Phone: [phone]   Fax:  [phone]  Physical Therapy Treatment  Patient Details  Name: Debbie Steele MRN: [id number] Date of Birth: 1956/02/07 Referring Provider (PT): Charlies Constable MD   Encounter Date: 04/29/2022   PT End of Session - 04/29/22 1100     Visit Number 7    Number of Visits 18    Date for PT Re-Evaluation 05/27/22    Authorization Type FOTO AT LEAST EVERY 5TH VISIT.  PROGRESS NOTE AT 10TH VISIT.  KX MODIFIER AFTER 15 VISITS.    PT Start Time 1034    PT Stop Time 1120    PT Time Calculation (min) 46 min    Equipment Utilized During Treatment Other (comment)   [place]   Activity Tolerance Patient tolerated treatment well    Behavior During Therapy WFL for tasks assessed/performed             Past Medical History:  Diagnosis Date   Anxiety    Arthritis    Bipolar disorder ([place])    Cancer of skin of back    Depression    GERD (gastroesophageal reflux disease)    Hyperlipidemia    Hypertension    Pre-diabetes     Past Surgical History:  Procedure Laterality Date   CARPAL TUNNEL RELEASE Right    CERVICAL ABLATION     CHOLECYSTECTOMY     MULTIPLE TOOTH EXTRACTIONS     SKIN CANCER EXCISION     On back   Surgery arm Right    TOTAL KNEE ARTHROPLASTY Left 04/13/2022   Procedure: TOTAL KNEE ARTHROPLASTY;  Surgeon: Willaim Sheng, MD;  Location: [facility];  Service: Orthopedics;  Laterality: Left;   TRIGGER FINGER RELEASE     TUBAL LIGATION      There were no vitals filed for this visit.   Subjective Assessment - 04/29/22 1036     Subjective Had to take pain meds after PT on monday.    Pertinent History BI-Polar, HTN, CTR.    How long can you walk comfortably? Around home with FWW.    Patient Stated Goals Get out of pain and do more.    Currently in Pain? Yes    Pain Score 3     Pain Location Knee    Pain Orientation Left    Pain  Descriptors / Indicators Sore    Pain Type Surgical pain    Pain Onset 1 to 4 weeks ago    Pain Frequency Constant                [facility] PT Assessment - 04/29/22 0001       Assessment   Medical Diagnosis S/p left total knee replacement.    Referring Provider (PT) Charlies Constable MD    Onset Date/Surgical Date 04/13/22    Next MD Visit 04/30/2022      Precautions   Precaution Comments No ultrasound.      ROM / Strength   AROM / PROM / Strength AROM      AROM   Overall AROM  Deficits    AROM Assessment Site Knee    Right/Left Knee Left    Right Knee Extension 6    Right Knee Flexion 103                           [facility] Adult PT Treatment/Exercise - 04/29/22 0001  Knee/Hip Exercises: Aerobic   Nustep L3 x16 min      Knee/Hip Exercises: Standing   Forward Lunges Left;20 reps    Forward Step Up Left;20 reps;Hand Hold: 2;Step Height: 6"    Step Down Left;5 reps;Hand Hold: 2;Step Height: 4"    Step Down Limitations stopped after feeling L knee pop      Knee/Hip Exercises: Seated   Long Arc Quad Strengthening;Left;20 reps;Weights    Long Arc Quad Weight 4 lbs.      Knee/Hip Exercises: Supine   Heel Slides AROM;Left;20 reps;10 reps    Heel Prop for Knee Extension 2 minutes;Weight    Heel Prop for Knee Extension Weight (lbs) 2      Modalities   Modalities Vasopneumatic      Vasopneumatic   Number Minutes Vasopneumatic  10 minutes    Vasopnuematic Location  Knee    Vasopneumatic Pressure Low    Vasopneumatic Temperature  34 for edema                       PT Short Term Goals - 04/15/22 1303       PT SHORT TERM GOAL #1   Title Independent with a HEP.    Time 2    Period Weeks    Status New               PT Long Term Goals - 04/15/22 1303       PT LONG TERM GOAL #1   Title Active left knee flexion to 115 degrees+ so the patient can perform functional tasks and do so with pain not > 2-3/10.    Time 6     Period Weeks    Status New      PT LONG TERM GOAL #2   Title Increase left hip and knee strength to a solid 4+/5 to provide good stability for accomplishment of functional activities.    Time 6    Period Weeks    Status New      PT LONG TERM GOAL #3   Title Perform a reciprocating stair gait with one railing with pain not > 2-3/10.    Time 6    Period Weeks    Status New      PT LONG TERM GOAL #4   Title Perform ADL's with pain not >                   Plan - 04/29/22 1128     Clinical Impression Statement Patient presented in clinic with reports of minimal L knee pain. Patient feels as if she is doing well and able to tolerate therex fairly well. Step downs stopped secondary to L knee pop reported by patient. AROM of L knee measured as 6-103 deg. Patient reporting more pull in anterior knee but to report to MD for bandage removal and follow up appointment tomorrow. Normal vasopneumatic response noted following removal of the modality.    Personal Factors and Comorbidities Comorbidity 1;Other    Comorbidities BI-Polar, HTN, CTR.    Examination-Activity Limitations Other;Bathing;Locomotion Level;Stand    Examination-Participation Restrictions Other;Meal Prep    Stability/Clinical Decision Making Stable/Uncomplicated    Rehab Potential Excellent    PT Treatment/Interventions ADLs/Self Care Home Management;Cryotherapy;Electrical Stimulation;Moist Heat;Stair training;Gait training;Functional mobility training;Therapeutic activities;Therapeutic exercise;Neuromuscular re-education;Manual techniques;Patient/family education;Passive range of motion;Vasopneumatic Device    PT Next Visit Plan Progress into TKA protocol.  Vasopneumatic.    Consulted and Agree with Plan of Care Patient  Patient will benefit from skilled therapeutic intervention in order to improve the following deficits and impairments:  Pain, Abnormal gait, Decreased activity tolerance, Decreased  strength, Decreased range of motion, Increased edema  Visit Diagnosis: Chronic pain of left knee  Stiffness of left knee, not elsewhere classified  Localized edema  Muscle weakness (generalized)     Problem List Patient Active Problem List   Diagnosis Date Noted   Localized osteoarthritis of left knee 04/13/2022   Toxic encephalopathy 12/26/2019   Rhabdomyolysis 12/26/2019   Hypokalemia 12/26/2019   Sinus pause 12/26/2019   Intentional drug overdose ([place])    Altered behavior    Acute respiratory failure ([place]) 12/15/2019   Prediabetes 09/30/2017   Parkinsonism ([place]) 02/23/2014   Morbid obesity ([place]) 08/21/2013   Anxiety state, unspecified 03/08/2013   Bipolar disorder, unspecified ([place]) 03/08/2013   Essential hypertension, benign 03/08/2013   Hyperlipidemia LDL goal <130 03/08/2013    Standley Brooking, PTA 04/29/2022, 11:32 AM  [facility] [address] [place], [place], [address] Phone: [phone]   Fax:  [phone]  Name: Debbie Steele MRN: [id number] Date of Birth: 1956-05-09

## 2022-04-30 ENCOUNTER — Other Ambulatory Visit (HOSPITAL_COMMUNITY): Payer: Self-pay

## 2022-04-30 DIAGNOSIS — M1712 Unilateral primary osteoarthritis, left knee: Secondary | ICD-10-CM | POA: Diagnosis not present

## 2022-04-30 MED ORDER — OXYCODONE HCL 5 MG PO TABS
ORAL_TABLET | ORAL | 0 refills | Status: AC
  Filled 2022-04-30: qty 21, 7d supply, fill #0

## 2022-05-01 ENCOUNTER — Encounter: Payer: Medicare Other | Admitting: *Deleted

## 2022-05-05 ENCOUNTER — Encounter: Payer: Self-pay | Admitting: Physical Therapy

## 2022-05-05 ENCOUNTER — Ambulatory Visit: Payer: Medicare Other | Admitting: Physical Therapy

## 2022-05-05 DIAGNOSIS — M25562 Pain in left knee: Secondary | ICD-10-CM

## 2022-05-05 DIAGNOSIS — M25662 Stiffness of left knee, not elsewhere classified: Secondary | ICD-10-CM | POA: Diagnosis not present

## 2022-05-05 DIAGNOSIS — M6281 Muscle weakness (generalized): Secondary | ICD-10-CM | POA: Diagnosis not present

## 2022-05-05 DIAGNOSIS — R600 Localized edema: Secondary | ICD-10-CM

## 2022-05-05 DIAGNOSIS — G8929 Other chronic pain: Secondary | ICD-10-CM | POA: Diagnosis not present

## 2022-05-05 NOTE — Therapy (Signed)
[facility] [place], [place], [address] Phone: [phone]   Fax:  [phone]  Physical Therapy Treatment  Patient Details  Name: Debbie Steele MRN: [id number] Date of Birth: [birth year] Referring Provider (PT): Charlies Constable MD   Encounter Date: 05/05/2022   PT End of Session - 05/05/22 1035     Visit Number 8    Number of Visits 18    Date for PT Re-Evaluation 05/27/22    Authorization Type FOTO AT LEAST EVERY 5TH VISIT.  PROGRESS NOTE AT 10TH VISIT.  KX MODIFIER AFTER 15 VISITS.    PT Start Time 1034    PT Stop Time 1116    PT Time Calculation (min) 42 min    Activity Tolerance Patient tolerated treatment well    Behavior During Therapy WFL for tasks assessed/performed             Past Medical History:  Diagnosis Date   Anxiety    Arthritis    Bipolar disorder ([place])    Cancer of skin of back    Depression    GERD (gastroesophageal reflux disease)    Hyperlipidemia    Hypertension    Pre-diabetes     Past Surgical History:  Procedure Laterality Date   CARPAL TUNNEL RELEASE Right    CERVICAL ABLATION     CHOLECYSTECTOMY     MULTIPLE TOOTH EXTRACTIONS     SKIN CANCER EXCISION     On back   Surgery arm Right    TOTAL KNEE ARTHROPLASTY Left 04/13/2022   Procedure: TOTAL KNEE ARTHROPLASTY;  Surgeon: Willaim Sheng, MD;  Location: [facility];  Service: Orthopedics;  Laterality: Left;   TRIGGER FINGER RELEASE     TUBAL LIGATION      There were no vitals filed for this visit.   Subjective Assessment - 05/05/22 1034     Subjective Reports she had more pain after step up training last week and had to go back on a walker. MD states that he was more focused on her ROM.    Pertinent History BI-Polar, HTN, CTR.    How long can you walk comfortably? Around home with FWW.    Patient Stated Goals Get out of pain and do more.    Currently in Pain? Yes    Pain Score 1     Pain Location Knee    Pain  Orientation Left    Pain Descriptors / Indicators Discomfort    Pain Type Surgical pain    Pain Onset 1 to 4 weeks ago    Pain Frequency Constant                [facility] PT Assessment - 05/05/22 0001       Assessment   Medical Diagnosis S/p left total knee replacement.    Referring Provider (PT) Charlies Constable MD    Onset Date/Surgical Date 04/13/22      Precautions   Precaution Comments No ultrasound.      Observation/Other Assessments-Edema    Edema Circumferential      Circumferential Edema   Circumferential - Right 44.4 cm    Circumferential - Left  47.2 cm      ROM / Strength   AROM / PROM / Strength AROM      AROM   Overall AROM  Within functional limits for tasks performed    AROM Assessment Site Knee    Right/Left Knee Left    Right Knee Extension 5  Right Knee Flexion 115                           [facility] Adult PT Treatment/Exercise - 05/05/22 0001       Knee/Hip Exercises: Stretches   Active Hamstring Stretch Left;5 reps;20 seconds      Knee/Hip Exercises: Aerobic   Nustep L2, seat 11 x16 min      Knee/Hip Exercises: Standing   Forward Lunges Left;20 reps    Forward Lunges Limitations 14" step    Rocker Board 4 minutes      Knee/Hip Exercises: Seated   Long Arc Quad Strengthening;Left;20 reps;Weights    Long Arc Quad Weight 4 lbs.      Knee/Hip Exercises: Supine   Heel Prop for Knee Extension 5 minutes      Modalities   Modalities Vasopneumatic      Vasopneumatic   Number Minutes Vasopneumatic  10 minutes    Vasopnuematic Location  Knee    Vasopneumatic Pressure Medium    Vasopneumatic Temperature  34 for edema                       PT Short Term Goals - 05/05/22 1106       PT SHORT TERM GOAL #1   Title Independent with a HEP.    Time 2    Period Weeks    Status On-going               PT Long Term Goals - 05/05/22 1106       PT LONG TERM GOAL #1   Title Active left knee flexion to 115  degrees+ so the patient can perform functional tasks and do so with pain not > 2-3/10.    Time 6    Period Weeks    Status Achieved      PT LONG TERM GOAL #2   Title Increase left hip and knee strength to a solid 4+/5 to provide good stability for accomplishment of functional activities.    Time 6    Period Weeks    Status On-going      PT LONG TERM GOAL #3   Title Perform a reciprocating stair gait with one railing with pain not > 2-3/10.    Time 6    Period Weeks    Status On-going      PT LONG TERM GOAL #4   Title Perform ADL's with pain not >    Period Weeks    Status On-going                   Plan - 05/05/22 1112     Clinical Impression Statement Patient presented in clinic with reports of good MD appointment although she had more pain after step up training. Patient guided through more stretching and strengthening exercises for ROM and functional activities. AROM of  L knee has improved greatly since last week and removal of the post surgical bandage. Patient has 3.3 cm difference with edema L > R. Patient reports most pain with knee extension and limits with sleeping currently due to pain. Normal vasopnuematic response noted following removal of the modality.    Personal Factors and Comorbidities Comorbidity 1;Other    Comorbidities BI-Polar, HTN, CTR.    Examination-Activity Limitations Other;Bathing;Locomotion Level;Stand    Examination-Participation Restrictions Other;Meal Prep    Stability/Clinical Decision Making Stable/Uncomplicated    Rehab Potential Excellent    PT Treatment/Interventions ADLs/Self  Care Home Management;Cryotherapy;Electrical Stimulation;Moist Heat;Stair training;Gait training;Functional mobility training;Therapeutic activities;Therapeutic exercise;Neuromuscular re-education;Manual techniques;Patient/family education;Passive range of motion;Vasopneumatic Device    PT Next Visit Plan Progress into TKA protocol.  Vasopneumatic.    Consulted  and Agree with Plan of Care Patient             Patient will benefit from skilled therapeutic intervention in order to improve the following deficits and impairments:  Pain, Abnormal gait, Decreased activity tolerance, Decreased strength, Decreased range of motion, Increased edema  Visit Diagnosis: Chronic pain of left knee  Stiffness of left knee, not elsewhere classified  Localized edema  Muscle weakness (generalized)     Problem List Patient Active Problem List   Diagnosis Date Noted   Localized osteoarthritis of left knee 04/13/2022   Toxic encephalopathy 12/26/2019   Rhabdomyolysis 12/26/2019   Hypokalemia 12/26/2019   Sinus pause 12/26/2019   Intentional drug overdose ([place])    Altered behavior    Acute respiratory failure ([place]) 12/15/2019   Prediabetes 09/30/2017   Parkinsonism ([place]) 02/23/2014   Morbid obesity ([place]) 08/21/2013   Anxiety state, unspecified 03/08/2013   Bipolar disorder, unspecified ([place]) 03/08/2013   Essential hypertension, benign 03/08/2013   Hyperlipidemia LDL goal <130 03/08/2013    Standley Brooking, PTA 05/05/2022, 11:27 AM  [facility] [address] [place], [place], [address] Phone: [phone]   Fax:  [phone]  Name: GORGEOUS NEWLUN MRN: [id number] Date of Birth: 12-Sep-1956

## 2022-05-08 ENCOUNTER — Encounter: Payer: Self-pay | Admitting: Physical Therapy

## 2022-05-08 ENCOUNTER — Ambulatory Visit: Payer: Medicare Other | Attending: Orthopedic Surgery | Admitting: Physical Therapy

## 2022-05-08 DIAGNOSIS — M6281 Muscle weakness (generalized): Secondary | ICD-10-CM

## 2022-05-08 DIAGNOSIS — M25662 Stiffness of left knee, not elsewhere classified: Secondary | ICD-10-CM | POA: Diagnosis not present

## 2022-05-08 DIAGNOSIS — R600 Localized edema: Secondary | ICD-10-CM

## 2022-05-08 DIAGNOSIS — M25562 Pain in left knee: Secondary | ICD-10-CM | POA: Diagnosis not present

## 2022-05-08 DIAGNOSIS — G8929 Other chronic pain: Secondary | ICD-10-CM | POA: Diagnosis not present

## 2022-05-08 NOTE — Therapy (Signed)
[facility] [place], [place], [address] Phone: [phone]   Fax:  [phone]  Physical Therapy Treatment  Patient Details  Name: Debbie Steele MRN: [id number] Date of Birth: 1956/07/12 Referring Provider (PT): Charlies Constable MD   Encounter Date: 05/08/2022   PT End of Session - 05/08/22 1048     Visit Number 9    Number of Visits 18    Date for PT Re-Evaluation 05/27/22    Authorization Type FOTO AT LEAST EVERY 5TH VISIT.  PROGRESS NOTE AT 10TH VISIT.  KX MODIFIER AFTER 15 VISITS.    PT Start Time 1034    PT Stop Time 1115    PT Time Calculation (min) 41 min    Activity Tolerance Patient tolerated treatment well    Behavior During Therapy WFL for tasks assessed/performed             Past Medical History:  Diagnosis Date   Anxiety    Arthritis    Bipolar disorder ([place])    Cancer of skin of back    Depression    GERD (gastroesophageal reflux disease)    Hyperlipidemia    Hypertension    Pre-diabetes     Past Surgical History:  Procedure Laterality Date   CARPAL TUNNEL RELEASE Right    CERVICAL ABLATION     CHOLECYSTECTOMY     MULTIPLE TOOTH EXTRACTIONS     SKIN CANCER EXCISION     On back   Surgery arm Right    TOTAL KNEE ARTHROPLASTY Left 04/13/2022   Procedure: TOTAL KNEE ARTHROPLASTY;  Surgeon: Willaim Sheng, MD;  Location: [facility];  Service: Orthopedics;  Laterality: Left;   TRIGGER FINGER RELEASE     TUBAL LIGATION      There were no vitals filed for this visit.   Subjective Assessment - 05/08/22 1045     Subjective Reports that medial knee pain wakes her up at night and pain more in the evenings. Icing a lot in the evenings too.    Pertinent History BI-Polar, HTN, CTR.    How long can you walk comfortably? Around home with FWW.    Patient Stated Goals Get out of pain and do more.    Currently in Pain? Yes    Pain Score 1     Pain Location Knee    Pain Orientation Left     Pain Descriptors / Indicators Discomfort    Pain Type Surgical pain    Pain Onset 1 to 4 weeks ago    Pain Frequency Constant                [facility] PT Assessment - 05/08/22 0001       Assessment   Medical Diagnosis S/p left total knee replacement.    Referring Provider (PT) Charlies Constable MD    Onset Date/Surgical Date 04/13/22    Next MD Visit 05/28/2022      Precautions   Precaution Comments No ultrasound.                           [place] Adult PT Treatment/Exercise - 05/08/22 0001       Knee/Hip Exercises: Stretches   Active Hamstring Stretch Left;3 reps;30 seconds      Knee/Hip Exercises: Aerobic   Nustep L2-3, seat 9-8 x18 min      Knee/Hip Exercises: Standing   Forward Lunges Left;20 reps    Forward Lunges Limitations 14" step  Rocker Board 3 minutes      Knee/Hip Exercises: Seated   Long Arc Quad Strengthening;Left;20 reps;Weights    Long Arc Quad Weight 4 lbs.      Modalities   Modalities Vasopneumatic      Vasopneumatic   Number Minutes Vasopneumatic  10 minutes    Vasopnuematic Location  Knee    Vasopneumatic Pressure Medium    Vasopneumatic Temperature  34 for edema                       PT Short Term Goals - 05/05/22 1106       PT SHORT TERM GOAL #1   Title Independent with a HEP.    Time 2    Period Weeks    Status On-going               PT Long Term Goals - 05/05/22 1106       PT LONG TERM GOAL #1   Title Active left knee flexion to 115 degrees+ so the patient can perform functional tasks and do so with pain not > 2-3/10.    Time 6    Period Weeks    Status Achieved      PT LONG TERM GOAL #2   Title Increase left hip and knee strength to a solid 4+/5 to provide good stability for accomplishment of functional activities.    Time 6    Period Weeks    Status On-going      PT LONG TERM GOAL #3   Title Perform a reciprocating stair gait with one railing with pain not > 2-3/10.    Time 6     Period Weeks    Status On-going      PT LONG TERM GOAL #4   Title Perform ADL's with pain not >    Period Weeks    Status On-going                   Plan - 05/08/22 1110     Clinical Impression Statement Patient presented in clinic with reports of very minimal pain. Complains of more medial knee pain as the day progresses an a night. Increased edema noted in lateral knee upon observation. Patient able to tolerate all therex well with primary focus of stretching for ROM. Normal vasopnuematic response noted following removal of the modality.    Personal Factors and Comorbidities Comorbidity 1;Other    Comorbidities BI-Polar, HTN, CTR.    Examination-Activity Limitations Other;Bathing;Locomotion Level;Stand    Examination-Participation Restrictions Other;Meal Prep    Stability/Clinical Decision Making Stable/Uncomplicated    Rehab Potential Excellent    PT Treatment/Interventions ADLs/Self Care Home Management;Cryotherapy;Electrical Stimulation;Moist Heat;Stair training;Gait training;Functional mobility training;Therapeutic activities;Therapeutic exercise;Neuromuscular re-education;Manual techniques;Patient/family education;Passive range of motion;Vasopneumatic Device    PT Next Visit Plan Progress into TKA protocol.  Vasopneumatic.    Consulted and Agree with Plan of Care Patient             Patient will benefit from skilled therapeutic intervention in order to improve the following deficits and impairments:  Pain, Abnormal gait, Decreased activity tolerance, Decreased strength, Decreased range of motion, Increased edema  Visit Diagnosis: Chronic pain of left knee  Stiffness of left knee, not elsewhere classified  Localized edema  Muscle weakness (generalized)     Problem List Patient Active Problem List   Diagnosis Date Noted   Localized osteoarthritis of left knee 04/13/2022   Toxic encephalopathy 12/26/2019   Rhabdomyolysis 12/26/2019  Hypokalemia  12/26/2019   Sinus pause 12/26/2019   Intentional drug overdose ([place])    Altered behavior    Acute respiratory failure ([place]) 12/15/2019   Prediabetes 09/30/2017   Parkinsonism ([place]) 02/23/2014   Morbid obesity ([place]) 08/21/2013   Anxiety state, unspecified 03/08/2013   Bipolar disorder, unspecified ([place]) 03/08/2013   Essential hypertension, benign 03/08/2013   Hyperlipidemia LDL goal <130 03/08/2013   Rationale for Evaluation and Treatment Rehabilitation   Standley Brooking, [place] 05/08/2022, 11:23 AM  [facility] [place], [place], [address] Phone: [phone]   Fax:  [phone]  Name: Debbie Steele MRN: [id number] Date of Birth: 08-08-1956

## 2022-05-11 DIAGNOSIS — Z743 Need for continuous supervision: Secondary | ICD-10-CM | POA: Diagnosis not present

## 2022-05-11 DIAGNOSIS — G9341 Metabolic encephalopathy: Secondary | ICD-10-CM | POA: Diagnosis not present

## 2022-05-11 DIAGNOSIS — I5030 Unspecified diastolic (congestive) heart failure: Secondary | ICD-10-CM | POA: Diagnosis not present

## 2022-05-11 DIAGNOSIS — W19XXXA Unspecified fall, initial encounter: Secondary | ICD-10-CM | POA: Diagnosis not present

## 2022-05-11 DIAGNOSIS — M9210 Juvenile osteochondrosis of radius and ulna, unspecified arm: Secondary | ICD-10-CM | POA: Diagnosis not present

## 2022-05-11 DIAGNOSIS — Z79899 Other long term (current) drug therapy: Secondary | ICD-10-CM | POA: Diagnosis not present

## 2022-05-11 DIAGNOSIS — Z8679 Personal history of other diseases of the circulatory system: Secondary | ICD-10-CM | POA: Diagnosis not present

## 2022-05-11 DIAGNOSIS — Z9181 History of falling: Secondary | ICD-10-CM | POA: Diagnosis not present

## 2022-05-11 DIAGNOSIS — I5032 Chronic diastolic (congestive) heart failure: Secondary | ICD-10-CM | POA: Diagnosis not present

## 2022-05-11 DIAGNOSIS — I351 Nonrheumatic aortic (valve) insufficiency: Secondary | ICD-10-CM | POA: Diagnosis not present

## 2022-05-11 DIAGNOSIS — G928 Other toxic encephalopathy: Secondary | ICD-10-CM | POA: Diagnosis not present

## 2022-05-11 DIAGNOSIS — R030 Elevated blood-pressure reading, without diagnosis of hypertension: Secondary | ICD-10-CM | POA: Diagnosis not present

## 2022-05-11 DIAGNOSIS — T426X1A Poisoning by other antiepileptic and sedative-hypnotic drugs, accidental (unintentional), initial encounter: Secondary | ICD-10-CM | POA: Diagnosis not present

## 2022-05-11 DIAGNOSIS — K219 Gastro-esophageal reflux disease without esophagitis: Secondary | ICD-10-CM | POA: Diagnosis not present

## 2022-05-11 DIAGNOSIS — Z9151 Personal history of suicidal behavior: Secondary | ICD-10-CM | POA: Diagnosis not present

## 2022-05-11 DIAGNOSIS — G20 Parkinson's disease: Secondary | ICD-10-CM | POA: Diagnosis not present

## 2022-05-11 DIAGNOSIS — W1839XA Other fall on same level, initial encounter: Secondary | ICD-10-CM | POA: Diagnosis not present

## 2022-05-11 DIAGNOSIS — R269 Unspecified abnormalities of gait and mobility: Secondary | ICD-10-CM | POA: Diagnosis not present

## 2022-05-11 DIAGNOSIS — J9811 Atelectasis: Secondary | ICD-10-CM | POA: Diagnosis not present

## 2022-05-11 DIAGNOSIS — I110 Hypertensive heart disease with heart failure: Secondary | ICD-10-CM | POA: Diagnosis not present

## 2022-05-11 DIAGNOSIS — F17290 Nicotine dependence, other tobacco product, uncomplicated: Secondary | ICD-10-CM | POA: Diagnosis not present

## 2022-05-11 DIAGNOSIS — E119 Type 2 diabetes mellitus without complications: Secondary | ICD-10-CM | POA: Diagnosis not present

## 2022-05-11 DIAGNOSIS — Z833 Family history of diabetes mellitus: Secondary | ICD-10-CM | POA: Diagnosis not present

## 2022-05-11 DIAGNOSIS — E876 Hypokalemia: Secondary | ICD-10-CM | POA: Diagnosis not present

## 2022-05-11 DIAGNOSIS — I10 Essential (primary) hypertension: Secondary | ICD-10-CM | POA: Diagnosis not present

## 2022-05-11 DIAGNOSIS — R404 Transient alteration of awareness: Secondary | ICD-10-CM | POA: Diagnosis not present

## 2022-05-11 DIAGNOSIS — G4700 Insomnia, unspecified: Secondary | ICD-10-CM | POA: Diagnosis not present

## 2022-05-11 DIAGNOSIS — R6889 Other general symptoms and signs: Secondary | ICD-10-CM | POA: Diagnosis not present

## 2022-05-11 DIAGNOSIS — E785 Hyperlipidemia, unspecified: Secondary | ICD-10-CM | POA: Diagnosis not present

## 2022-05-11 DIAGNOSIS — R197 Diarrhea, unspecified: Secondary | ICD-10-CM | POA: Diagnosis not present

## 2022-05-15 ENCOUNTER — Ambulatory Visit: Payer: Medicare Other | Admitting: *Deleted

## 2022-05-15 DIAGNOSIS — R600 Localized edema: Secondary | ICD-10-CM | POA: Diagnosis not present

## 2022-05-15 DIAGNOSIS — M25562 Pain in left knee: Secondary | ICD-10-CM

## 2022-05-15 DIAGNOSIS — M25662 Stiffness of left knee, not elsewhere classified: Secondary | ICD-10-CM

## 2022-05-15 DIAGNOSIS — M6281 Muscle weakness (generalized): Secondary | ICD-10-CM | POA: Diagnosis not present

## 2022-05-15 DIAGNOSIS — G8929 Other chronic pain: Secondary | ICD-10-CM | POA: Diagnosis not present

## 2022-05-15 NOTE — Therapy (Signed)
[facility] [place], [place], [address] Phone: [phone]   Fax:  [phone]  Physical Therapy Treatment  Patient Details  Name: Debbie Steele MRN: [id number] Date of Birth: 06/06/1956 Referring Provider (PT): Charlies Constable MD   Encounter Date: 05/15/2022   PT End of Session - 05/15/22 1024     Visit Number 10    Number of Visits 18    Date for PT Re-Evaluation 05/27/22    Authorization Type FOTO AT LEAST EVERY 5TH VISIT.  PROGRESS NOTE AT 10TH VISIT.  KX MODIFIER AFTER 15 VISITS.    PT Start Time 0945    PT Stop Time 1035    PT Time Calculation (min) 50 min             Past Medical History:  Diagnosis Date   Anxiety    Arthritis    Bipolar disorder ([place])    Cancer of skin of back    Depression    GERD (gastroesophageal reflux disease)    Hyperlipidemia    Hypertension    Pre-diabetes     Past Surgical History:  Procedure Laterality Date   CARPAL TUNNEL RELEASE Right    CERVICAL ABLATION     CHOLECYSTECTOMY     MULTIPLE TOOTH EXTRACTIONS     SKIN CANCER EXCISION     On back   Surgery arm Right    TOTAL KNEE ARTHROPLASTY Left 04/13/2022   Procedure: TOTAL KNEE ARTHROPLASTY;  Surgeon: Willaim Sheng, MD;  Location: [facility];  Service: Orthopedics;  Laterality: Left;   TRIGGER FINGER RELEASE     TUBAL LIGATION      There were no vitals filed for this visit.   Subjective Assessment - 05/15/22 1002     Subjective Fell at home on Monday and had to stay in the hospital for 3 days. Sore today. Go slow    Pertinent History BI-Polar, HTN, CTR.    How long can you walk comfortably? Around home with FWW.    Patient Stated Goals Get out of pain and do more.    Currently in Pain? Yes    Pain Score 1     Pain Location Knee    Pain Orientation Left    Pain Descriptors / Indicators Discomfort    Pain Type Surgical pain    Pain Onset 1 to 4 weeks ago                                [facility] Adult PT Treatment/Exercise - 05/15/22 0001       Knee/Hip Exercises: Aerobic   Nustep L2-3, seat 12,10,9 x20 min      Knee/Hip Exercises: Seated   Long Arc Quad Strengthening;Left;Weights;10 reps;3 sets   hold 5 secs   Long Arc Quad Weight 4 lbs.      Modalities   Modalities Vasopneumatic      Vasopneumatic   Number Minutes Vasopneumatic  15 minutes    Vasopnuematic Location  Knee    Vasopneumatic Pressure Medium    Vasopneumatic Temperature  34 for edema                       PT Short Term Goals - 05/05/22 1106       PT SHORT TERM GOAL #1   Title Independent with a HEP.    Time 2    Period Weeks    Status  On-going               PT Long Term Goals - 05/05/22 1106       PT LONG TERM GOAL #1   Title Active left knee flexion to 115 degrees+ so the patient can perform functional tasks and do so with pain not > 2-3/10.    Time 6    Period Weeks    Status Achieved      PT LONG TERM GOAL #2   Title Increase left hip and knee strength to a solid 4+/5 to provide good stability for accomplishment of functional activities.    Time 6    Period Weeks    Status On-going      PT LONG TERM GOAL #3   Title Perform a reciprocating stair gait with one railing with pain not > 2-3/10.    Time 6    Period Weeks    Status On-going      PT LONG TERM GOAL #4   Title Perform ADL's with pain not >    Period Weeks    Status On-going                   Plan - 05/15/22 1024     Clinical Impression Statement Pt not doing well today due to falling at home 4 days ago due to low patassium levels. She was able to perform light therex today and felt better overall end of session. LT knee flexion ROM limited to 105 degrees today LT knee.    Personal Factors and Comorbidities Comorbidity 1;Other    Comorbidities BI-Polar, HTN, CTR.    Examination-Activity Limitations Other;Bathing;Locomotion Level;Stand     Stability/Clinical Decision Making Stable/Uncomplicated    Rehab Potential Excellent    PT Treatment/Interventions ADLs/Self Care Home Management;Cryotherapy;Electrical Stimulation;Moist Heat;Stair training;Gait training;Functional mobility training;Therapeutic activities;Therapeutic exercise;Neuromuscular re-education;Manual techniques;Patient/family education;Passive range of motion;Vasopneumatic Device    PT Next Visit Plan Progress into TKA protocol.  Vasopneumatic.    Consulted and Agree with Plan of Care Patient             Patient will benefit from skilled therapeutic intervention in order to improve the following deficits and impairments:  Pain, Abnormal gait, Decreased activity tolerance, Decreased strength, Decreased range of motion, Increased edema  Visit Diagnosis: Chronic pain of left knee  Stiffness of left knee, not elsewhere classified  Localized edema  Muscle weakness (generalized)     Problem List Patient Active Problem List   Diagnosis Date Noted   Localized osteoarthritis of left knee 04/13/2022   Toxic encephalopathy 12/26/2019   Rhabdomyolysis 12/26/2019   Hypokalemia 12/26/2019   Sinus pause 12/26/2019   Intentional drug overdose ([place])    Altered behavior    Acute respiratory failure ([place]) 12/15/2019   Prediabetes 09/30/2017   Parkinsonism ([place]) 02/23/2014   Morbid obesity ([place]) 08/21/2013   Anxiety state, unspecified 03/08/2013   Bipolar disorder, unspecified ([place]) 03/08/2013   Essential hypertension, benign 03/08/2013   Hyperlipidemia LDL goal <130 03/08/2013  Rationale for Evaluation and Treatment Rehabilitation   Hadassa Cermak,CHRIS, PTA 05/15/2022, 12:25 PM  [facility] [address] [place], [place], [address] Phone: [phone]   Fax:  [phone]  Name: Debbie Steele MRN: [id number] Date of Birth: [birth year]

## 2022-05-18 ENCOUNTER — Ambulatory Visit: Payer: Medicare Other | Admitting: *Deleted

## 2022-05-18 DIAGNOSIS — M25662 Stiffness of left knee, not elsewhere classified: Secondary | ICD-10-CM

## 2022-05-18 DIAGNOSIS — R600 Localized edema: Secondary | ICD-10-CM

## 2022-05-18 DIAGNOSIS — M25562 Pain in left knee: Secondary | ICD-10-CM | POA: Diagnosis not present

## 2022-05-18 DIAGNOSIS — G8929 Other chronic pain: Secondary | ICD-10-CM

## 2022-05-18 DIAGNOSIS — M6281 Muscle weakness (generalized): Secondary | ICD-10-CM

## 2022-05-18 NOTE — Therapy (Signed)
[facility] [place], [place], [address] Phone: [phone]   Fax:  [phone]  Physical Therapy Treatment  Patient Details  Name: Debbie Steele MRN: [id number] Date of Birth: 12-19-1955 Referring Provider (PT): Charlies Constable MD   Encounter Date: 05/18/2022   PT End of Session - 05/18/22 0928     Visit Number 11    Number of Visits 18    Date for PT Re-Evaluation 05/27/22    Authorization Type FOTO AT LEAST EVERY 5TH VISIT.  PROGRESS NOTE AT 10TH VISIT.  KX MODIFIER AFTER 15 VISITS.    PT Start Time 0900    PT Stop Time 0955    PT Time Calculation (min) 55 min             Past Medical History:  Diagnosis Date   Anxiety    Arthritis    Bipolar disorder ([place])    Cancer of skin of back    Depression    GERD (gastroesophageal reflux disease)    Hyperlipidemia    Hypertension    Pre-diabetes     Past Surgical History:  Procedure Laterality Date   CARPAL TUNNEL RELEASE Right    CERVICAL ABLATION     CHOLECYSTECTOMY     MULTIPLE TOOTH EXTRACTIONS     SKIN CANCER EXCISION     On back   Surgery arm Right    TOTAL KNEE ARTHROPLASTY Left 04/13/2022   Procedure: TOTAL KNEE ARTHROPLASTY;  Surgeon: Willaim Sheng, MD;  Location: [facility];  Service: Orthopedics;  Laterality: Left;   TRIGGER FINGER RELEASE     TUBAL LIGATION      There were no vitals filed for this visit.   Subjective Assessment - 05/18/22 0908     Subjective Did good after last Rx. LT knee 1-2/10 today    Pertinent History BI-Polar, HTN, CTR.    How long can you walk comfortably? Around home with FWW.    Patient Stated Goals Get out of pain and do more.    Currently in Pain? Yes    Pain Score 2     Pain Location Knee    Pain Orientation Left    Pain Descriptors / Indicators Discomfort    Pain Type Surgical pain    Pain Onset 1 to 4 weeks ago                               [facility] Adult PT Treatment/Exercise  - 05/18/22 0001       Knee/Hip Exercises: Aerobic   Nustep L2-3, seat 12,10,9 x20 min      Knee/Hip Exercises: Machines for Strengthening   Cybex Knee Extension 10# 2x10 5 sec holds    Cybex Knee Flexion 30# 2x10      Knee/Hip Exercises: Standing   Forward Lunges Left;1 set;10 reps   hold 10 secs   Rocker Board 4 minutes      Knee/Hip Exercises: Seated   Long Arc Quad --   hold 5 secs     Modalities   Modalities Vasopneumatic      Vasopneumatic   Number Minutes Vasopneumatic  15 minutes    Vasopnuematic Location  Knee    Vasopneumatic Pressure Medium    Vasopneumatic Temperature  34 for edema                       PT Short Term Goals - 05/05/22  1106       PT SHORT TERM GOAL #1   Title Independent with a HEP.    Time 2    Period Weeks    Status On-going               PT Long Term Goals - 05/05/22 1106       PT LONG TERM GOAL #1   Title Active left knee flexion to 115 degrees+ so the patient can perform functional tasks and do so with pain not > 2-3/10.    Time 6    Period Weeks    Status Achieved      PT LONG TERM GOAL #2   Title Increase left hip and knee strength to a solid 4+/5 to provide good stability for accomplishment of functional activities.    Time 6    Period Weeks    Status On-going      PT LONG TERM GOAL #3   Title Perform a reciprocating stair gait with one railing with pain not > 2-3/10.    Time 6    Period Weeks    Status On-going      PT LONG TERM GOAL #4   Title Perform ADL's with pain not >    Period Weeks    Status On-going                   Plan - 05/18/22 0929     Clinical Impression Statement Pt arrived today doing some better after last Rx. She was able to resume some previous exs today for ROM and strengthening. Able to perform knee extension/ curl machines today without increased pain. AROM 4-115 degrees today. Vaso end of session. To MD tomorrow.    Personal Factors and Comorbidities Comorbidity  1;Other    Comorbidities BI-Polar, HTN, CTR.    Examination-Activity Limitations Other;Bathing;Locomotion Level;Stand    Examination-Participation Restrictions Other;Meal Prep    Stability/Clinical Decision Making Stable/Uncomplicated    Rehab Potential Excellent    PT Treatment/Interventions ADLs/Self Care Home Management;Cryotherapy;Electrical Stimulation;Moist Heat;Stair training;Gait training;Functional mobility training;Therapeutic activities;Therapeutic exercise;Neuromuscular re-education;Manual techniques;Patient/family education;Passive range of motion;Vasopneumatic Device    PT Next Visit Plan Progress into TKA protocol.  Vasopneumatic.    Consulted and Agree with Plan of Care Patient             Patient will benefit from skilled therapeutic intervention in order to improve the following deficits and impairments:  Pain, Abnormal gait, Decreased activity tolerance, Decreased strength, Decreased range of motion, Increased edema  Visit Diagnosis: Chronic pain of left knee  Stiffness of left knee, not elsewhere classified  Localized edema  Muscle weakness (generalized)     Problem List Patient Active Problem List   Diagnosis Date Noted   Localized osteoarthritis of left knee 04/13/2022   Toxic encephalopathy 12/26/2019   Rhabdomyolysis 12/26/2019   Hypokalemia 12/26/2019   Sinus pause 12/26/2019   Intentional drug overdose ([place])    Altered behavior    Acute respiratory failure ([place]) 12/15/2019   Prediabetes 09/30/2017   Parkinsonism ([place]) 02/23/2014   Morbid obesity ([place]) 08/21/2013   Anxiety state, unspecified 03/08/2013   Bipolar disorder, unspecified ([place]) 03/08/2013   Essential hypertension, benign 03/08/2013   Hyperlipidemia LDL goal <130 03/08/2013  Rationale for Evaluation and Treatment Rehabilitation   Chyane Greer,CHRIS, PTA 05/18/2022, 10:57 AM  [facility] [address] [place], [place], [address] Phone:  [phone]   Fax:  [phone]  Name: Debbie Steele MRN: [id number] Date of Birth:  03-Apr-1956  Progress Note Reporting Period 04/15/22 to 05/18/22  See note below for Objective Data and Assessment of Progress/Goals.   Patient is making good progress with skilled physical therapy as evidenced by her improved left knee mobility as she was able to demonstrate 4 degrees of active extension and 115 degrees of flexion. Recommend that she continue with skilled physical therapy to address her remaining impairments to return to her prior level of function.   Jacqulynn Cadet, PT, DPT

## 2022-05-19 DIAGNOSIS — M1712 Unilateral primary osteoarthritis, left knee: Secondary | ICD-10-CM | POA: Diagnosis not present

## 2022-05-20 DIAGNOSIS — F17210 Nicotine dependence, cigarettes, uncomplicated: Secondary | ICD-10-CM | POA: Diagnosis not present

## 2022-05-20 DIAGNOSIS — Z299 Encounter for prophylactic measures, unspecified: Secondary | ICD-10-CM | POA: Diagnosis not present

## 2022-05-20 DIAGNOSIS — I10 Essential (primary) hypertension: Secondary | ICD-10-CM | POA: Diagnosis not present

## 2022-05-20 DIAGNOSIS — E1165 Type 2 diabetes mellitus with hyperglycemia: Secondary | ICD-10-CM | POA: Diagnosis not present

## 2022-05-22 ENCOUNTER — Encounter: Payer: Medicare Other | Admitting: *Deleted

## 2022-06-05 DIAGNOSIS — M6728 Synovial hypertrophy, not elsewhere classified, other site: Secondary | ICD-10-CM | POA: Diagnosis not present

## 2022-06-05 DIAGNOSIS — M67231 Synovial hypertrophy, not elsewhere classified, right forearm: Secondary | ICD-10-CM | POA: Diagnosis not present

## 2022-06-05 DIAGNOSIS — S63591A Other specified sprain of right wrist, initial encounter: Secondary | ICD-10-CM | POA: Diagnosis not present

## 2022-06-23 DIAGNOSIS — K219 Gastro-esophageal reflux disease without esophagitis: Secondary | ICD-10-CM | POA: Diagnosis not present

## 2022-06-23 DIAGNOSIS — Z79899 Other long term (current) drug therapy: Secondary | ICD-10-CM | POA: Diagnosis not present

## 2022-06-23 DIAGNOSIS — R5383 Other fatigue: Secondary | ICD-10-CM | POA: Diagnosis not present

## 2022-06-23 DIAGNOSIS — Z0000 Encounter for general adult medical examination without abnormal findings: Secondary | ICD-10-CM | POA: Diagnosis not present

## 2022-06-23 DIAGNOSIS — E7800 Pure hypercholesterolemia, unspecified: Secondary | ICD-10-CM | POA: Diagnosis not present

## 2022-06-23 DIAGNOSIS — Z299 Encounter for prophylactic measures, unspecified: Secondary | ICD-10-CM | POA: Diagnosis not present

## 2022-06-23 DIAGNOSIS — F17210 Nicotine dependence, cigarettes, uncomplicated: Secondary | ICD-10-CM | POA: Diagnosis not present

## 2022-06-23 DIAGNOSIS — Z7189 Other specified counseling: Secondary | ICD-10-CM | POA: Diagnosis not present

## 2022-06-23 DIAGNOSIS — I10 Essential (primary) hypertension: Secondary | ICD-10-CM | POA: Diagnosis not present

## 2022-07-02 DIAGNOSIS — M1712 Unilateral primary osteoarthritis, left knee: Secondary | ICD-10-CM | POA: Diagnosis not present

## 2022-07-07 DEATH — deceased
